# Patient Record
Sex: Female | Born: 1970 | Race: Black or African American | Hispanic: No | Marital: Single | Smoking: Never smoker
Health system: Southern US, Community
[De-identification: ages and names within clinical notes are randomized; demographics above are authoritative.]

## PROBLEM LIST (undated history)

## (undated) DIAGNOSIS — Z923 Personal history of irradiation: Secondary | ICD-10-CM

## (undated) DIAGNOSIS — M1990 Unspecified osteoarthritis, unspecified site: Secondary | ICD-10-CM

## (undated) DIAGNOSIS — D649 Anemia, unspecified: Secondary | ICD-10-CM

## (undated) DIAGNOSIS — E669 Obesity, unspecified: Secondary | ICD-10-CM

## (undated) DIAGNOSIS — Z8619 Personal history of other infectious and parasitic diseases: Secondary | ICD-10-CM

## (undated) DIAGNOSIS — I10 Essential (primary) hypertension: Secondary | ICD-10-CM

## (undated) DIAGNOSIS — C50919 Malignant neoplasm of unspecified site of unspecified female breast: Secondary | ICD-10-CM

## (undated) DIAGNOSIS — Z9289 Personal history of other medical treatment: Secondary | ICD-10-CM

## (undated) HISTORY — PX: FOOT FRACTURE SURGERY: SHX645

## (undated) HISTORY — DX: Unspecified osteoarthritis, unspecified site: M19.90

## (undated) HISTORY — DX: Personal history of other infectious and parasitic diseases: Z86.19

## (undated) HISTORY — DX: Essential (primary) hypertension: I10

## (undated) HISTORY — DX: Malignant neoplasm of unspecified site of unspecified female breast: C50.919

## (undated) HISTORY — PX: JOINT REPLACEMENT: SHX530

---

## 2006-03-19 ENCOUNTER — Ambulatory Visit: Payer: Self-pay

## 2006-03-27 ENCOUNTER — Ambulatory Visit: Payer: Self-pay

## 2006-09-28 ENCOUNTER — Ambulatory Visit: Payer: Self-pay

## 2007-06-07 ENCOUNTER — Ambulatory Visit: Payer: Self-pay

## 2010-03-08 ENCOUNTER — Observation Stay: Payer: Self-pay | Admitting: Obstetrics and Gynecology

## 2010-03-25 ENCOUNTER — Inpatient Hospital Stay: Payer: Self-pay | Admitting: Obstetrics & Gynecology

## 2010-03-30 LAB — PATHOLOGY REPORT

## 2010-07-07 HISTORY — PX: OTHER SURGICAL HISTORY: SHX169

## 2011-02-06 ENCOUNTER — Ambulatory Visit: Payer: Self-pay | Admitting: Sports Medicine

## 2011-02-28 ENCOUNTER — Ambulatory Visit: Payer: Self-pay | Admitting: Unknown Physician Specialty

## 2011-07-08 HISTORY — PX: MEDIAL PARTIAL KNEE REPLACEMENT: SHX5965

## 2011-08-21 ENCOUNTER — Ambulatory Visit: Payer: Self-pay | Admitting: Unknown Physician Specialty

## 2011-09-08 ENCOUNTER — Ambulatory Visit: Payer: Self-pay | Admitting: Orthopedic Surgery

## 2011-09-08 LAB — BASIC METABOLIC PANEL
BUN: 8 mg/dL (ref 7–18)
Calcium, Total: 9 mg/dL (ref 8.5–10.1)
Chloride: 106 mmol/L (ref 98–107)
Co2: 27 mmol/L (ref 21–32)
EGFR (Non-African Amer.): >60
Glucose: 98 mg/dL (ref 65–99)
Potassium: 3.8 mmol/L (ref 3.5–5.1)
Sodium: 141 mmol/L (ref 136–145)

## 2011-09-08 LAB — CBC
HGB: 11.8 g/dL — ABNORMAL LOW (ref 12.0–16.0)
MCH: 27.1 pg (ref 26.0–34.0)
MCV: 84 fL (ref 80–100)
Platelet: 327 10*3/uL (ref 150–440)
RBC: 4.36 10*6/uL (ref 3.80–5.20)
WBC: 11.1 10*3/uL — ABNORMAL HIGH (ref 3.6–11.0)

## 2011-09-08 LAB — APTT: Activated PTT: 27.5 secs (ref 23.6–35.9)

## 2011-09-08 LAB — SEDIMENTATION RATE: Erythrocyte Sed Rate: 21 mm/hr — ABNORMAL HIGH (ref 0–20)

## 2011-09-16 ENCOUNTER — Inpatient Hospital Stay: Payer: Self-pay | Admitting: Orthopedic Surgery

## 2011-09-16 LAB — PREGNANCY, URINE: Pregnancy Test, Urine: NEGATIVE m[IU]/mL

## 2011-09-17 LAB — BASIC METABOLIC PANEL
Anion Gap: 10 (ref 7–16)
Calcium, Total: 8.2 mg/dL — ABNORMAL LOW (ref 8.5–10.1)
Co2: 24 mmol/L (ref 21–32)
EGFR (African American): >60
EGFR (Non-African Amer.): >60
Glucose: 114 mg/dL — ABNORMAL HIGH (ref 65–99)
Osmolality: 272 (ref 275–301)

## 2011-09-17 LAB — HEMOGLOBIN: HGB: 10 g/dL — ABNORMAL LOW (ref 12.0–16.0)

## 2013-11-23 ENCOUNTER — Encounter (INDEPENDENT_AMBULATORY_CARE_PROVIDER_SITE_OTHER): Payer: Self-pay

## 2013-11-23 ENCOUNTER — Encounter: Payer: Self-pay | Admitting: Adult Health

## 2013-11-23 ENCOUNTER — Ambulatory Visit (INDEPENDENT_AMBULATORY_CARE_PROVIDER_SITE_OTHER): Payer: BC Managed Care – PPO | Admitting: Adult Health

## 2013-11-23 VITALS — BP 130/78 | HR 92 | Temp 98.3°F | Resp 14 | Ht 66.25 in | Wt 275.5 lb

## 2013-11-23 DIAGNOSIS — Z309 Encounter for contraceptive management, unspecified: Secondary | ICD-10-CM

## 2013-11-23 DIAGNOSIS — IMO0001 Reserved for inherently not codable concepts without codable children: Secondary | ICD-10-CM | POA: Insufficient documentation

## 2013-11-23 DIAGNOSIS — Z1239 Encounter for other screening for malignant neoplasm of breast: Secondary | ICD-10-CM

## 2013-11-23 MED ORDER — LEVONORGEST-ETH ESTRAD 91-DAY 0.15-0.03 &0.01 MG PO TABS: ORAL_TABLET | ORAL | Status: DC

## 2013-11-23 NOTE — Progress Notes (Signed)
Pre visit review using our clinic review tool, if applicable. No additional management support is needed unless otherwise documented below in the visit note. 

## 2013-11-23 NOTE — Patient Instructions (Signed)
   Thank you for choosing [place] [facility] for your health care needs.  We will contact you with an appointment for your mammogram and also for Gennie Alma, NP.  Please call the office if you have not heard back from [place] within 1 week.  Schedule your physical exam at your earliest convenience. You will need to be fasting for that visit.  Start Seasonique 1 tablet daily for 12 weeks. No pill on week #13 which is when you should get your period. Then restart cycle as discussed. You may experience some breakthrough bleeding/spoting during the first 2 cycles. This should subside.  Please call with any questions or concerns.

## 2013-11-23 NOTE — Progress Notes (Signed)
Patient ID: Lorraine Holmes, female   DOB: Jun 10, 1971, 43 y.o.   MRN: [id number]    Subjective:    Patient ID: Lorraine Holmes, female    DOB: 1970/11/23, 43 y.o.   MRN: [id number]  HPI Pt is a pleasant 43 y/o female who presents to establish care. Was followed by Dr. Glennon Hamilton many years ago. She has gone to the [facility] in clinic in [place]. Has not had regular PCP in many years. Last PAP was in 2011 after the birth of her daughter. Was followed previously by Gennie Alma, NP and also at [facility].  PAP 2011 - Normal Mammogram - 2010 - multiple cysts. She has hx of fine needle aspirations. No mammogram since then.   She has not been using any contraception since she had her child in 2011. She reports using condoms "sometimes". Has been with the same partner for many years. Would like to get started on [facility] and also discuss permanent options.    Past Medical History  Diagnosis Date  . Arthritis     Bilateral knee  . History of chicken pox      Past Surgical History  Procedure Laterality Date  . Medial partial knee replacement Right 2013  . Meniscus tear Right 2012  . Foot fracture surgery Left     2002     Family History  Problem Relation Age of Onset  . Hypertension Mother   . Diabetes Mother   . Hypertension Father   . Heart disease Father 35    CAD - died MI  . Diabetes Maternal Grandmother      History   Social History  . Marital Status: Single    Spouse Name: N/A    Number of Children: 2  . Years of Education: 16   Occupational History  . [employer]   Social History Main Topics  . Smoking status: Never Smoker   . Smokeless tobacco: Never Used  . Alcohol Use: 1.2 oz/week    2 Glasses of wine per week  . Drug Use: No  . Sexual Activity: Yes   Other Topics Concern  . Not on file   Social History Narrative   Taniesha grew up in [place]. She lives at home with her children and her mother. 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      Review of Systems  Constitutional: Negative.   HENT: Negative.   Eyes: Negative.   Respiratory: Negative.   Cardiovascular: Negative.   Gastrointestinal: Negative.   Endocrine: Negative.   Genitourinary: Negative.   Musculoskeletal: Negative.   Skin: Negative.   Allergic/Immunologic: Negative.   Neurological: Negative.   Hematological: Negative.   Psychiatric/Behavioral: Negative.        Objective:  BP 130/78  Pulse 92  Temp(Src) 98.3 F (36.8 C) (Oral)  Resp 14  Ht 5' 6.25" (1.683 m)  Wt 275 lb 8 oz (124.966 kg)  BMI 44.12 kg/m2  SpO2 98%  LMP 11/21/2013   Physical Exam  Constitutional: She is oriented to person, place, and time. She is cooperative. No distress.  Overweight, pleasant 43 y/o female  HENT:  Head: Normocephalic and atraumatic.  Eyes: Conjunctivae and EOM are normal.  Neck: Normal range of motion. Neck supple. No tracheal deviation present.  Cardiovascular: Normal rate, regular rhythm, normal heart sounds and intact distal pulses.  Exam reveals no gallop and no friction rub.   No murmur heard. Pulmonary/Chest:  Effort normal and breath sounds normal. No respiratory distress. She has no wheezes. She has no rales.  Musculoskeletal: Normal range of motion.  Neurological: She is alert and oriented to person, place, and time. She has normal reflexes. Coordination normal.  Skin: Skin is warm and dry.  Psychiatric: She has a normal mood and affect. Her behavior is normal. Judgment and thought content normal.       Assessment & Plan:   1. Screening for breast cancer Last Mammogram in 2010. Hx of multiple cysts. Will send for screening mammogram.  - MM DIGITAL SCREENING BILATERAL; Future  2. Birth control Start [place]. LMP 11/17/13. She will take an active pill continuously x 12 weeks and then no pills on week #13 which is when she will get her periods. May experience breakthrough bleeding first 2 cycles. I am  also referring her to Gennie Alma, NP to discuss permanent options for birth control. - Ambulatory referral to Gynecology

## 2013-12-21 ENCOUNTER — Ambulatory Visit (INDEPENDENT_AMBULATORY_CARE_PROVIDER_SITE_OTHER): Payer: BC Managed Care – PPO | Admitting: Adult Health

## 2013-12-21 ENCOUNTER — Encounter: Payer: Self-pay | Admitting: Adult Health

## 2013-12-21 VITALS — BP 124/78 | HR 79 | Temp 98.4°F | Resp 14 | Ht 66.25 in | Wt 276.0 lb

## 2013-12-21 DIAGNOSIS — Z0000 Encounter for general adult medical examination without abnormal findings: Secondary | ICD-10-CM

## 2013-12-21 LAB — COMPREHENSIVE METABOLIC PANEL
ALBUMIN: 3.7 g/dL (ref 3.5–5.2)
ALT: 12 U/L (ref 0–35)
AST: 15 U/L (ref 0–37)
Alkaline Phosphatase: 60 U/L (ref 39–117)
BUN: 7 mg/dL (ref 6–23)
CALCIUM: 9.5 mg/dL (ref 8.4–10.5)
CHLORIDE: 108 meq/L (ref 96–112)
CO2: 25 mEq/L (ref 19–32)
Creatinine, Ser: 0.8 mg/dL (ref 0.4–1.2)
GFR: 102.68 mL/min (ref >60.00)
Glucose, Bld: 94 mg/dL (ref 70–99)
POTASSIUM: 4.6 meq/L (ref 3.5–5.1)
SODIUM: 139 meq/L (ref 135–145)
TOTAL PROTEIN: 7.5 g/dL (ref 6.0–8.3)
Total Bilirubin: 0.2 mg/dL (ref 0.2–1.2)

## 2013-12-21 LAB — LIPID PANEL
CHOL/HDL RATIO: 3
Cholesterol: 133 mg/dL (ref 0–200)
HDL: 40.4 mg/dL (ref >39.00)
LDL CALC: 79 mg/dL (ref 0–99)
NONHDL: 92.6
TRIGLYCERIDES: 67 mg/dL (ref 0.0–149.0)
VLDL: 13.4 mg/dL (ref 0.0–40.0)

## 2013-12-21 LAB — CBC WITH DIFFERENTIAL/PLATELET
BASOS PCT: 0.4 % (ref 0.0–3.0)
Basophils Absolute: 0 10*3/uL (ref 0.0–0.1)
EOS ABS: 0.1 10*3/uL (ref 0.0–0.7)
Eosinophils Relative: 1.3 % (ref 0.0–5.0)
HEMATOCRIT: 34.9 % — AB (ref 36.0–46.0)
Hemoglobin: 11 g/dL — ABNORMAL LOW (ref 12.0–15.0)
LYMPHS ABS: 2.1 10*3/uL (ref 0.7–4.0)
Lymphocytes Relative: 24.8 % (ref 12.0–46.0)
MCHC: 31.5 g/dL (ref 30.0–36.0)
MCV: 80.5 fl (ref 78.0–100.0)
MONO ABS: 0.5 10*3/uL (ref 0.1–1.0)
Monocytes Relative: 5.3 % (ref 3.0–12.0)
NEUTROS ABS: 5.8 10*3/uL (ref 1.4–7.7)
NEUTROS PCT: 68.2 % (ref 43.0–77.0)
Platelets: 334 10*3/uL (ref 150.0–400.0)
RBC: 4.33 Mil/uL (ref 3.87–5.11)
RDW: 18.1 % — ABNORMAL HIGH (ref 11.5–15.5)
WBC: 8.5 10*3/uL (ref 4.0–10.5)

## 2013-12-21 LAB — TSH: TSH: 0.69 u[IU]/mL (ref 0.35–4.50)

## 2013-12-21 NOTE — Progress Notes (Signed)
Patient ID: Lorraine Holmes, female   DOB: 09/21/1970, 43 y.o.   MRN: [id number]   Subjective:    Patient ID: Lorraine Holmes, female    DOB: 23-Sep-1970, 43 y.o.   MRN: [id number]  HPI  Patient is a pleasant 43 year old female who presents to clinic for annual physical. She is up-to-date on her Pap. [facility]. Has appointment with them today for insertion of IUD. Needs appointment for mammogram. We will get labs this morning. Pt is fasting. Feeling well overall.    Past Medical History  Diagnosis Date  . Arthritis     Bilateral knee  . History of chicken pox      Past Surgical History  Procedure Laterality Date  . Medial partial knee replacement Right 2013  . Meniscus tear Right 2012  . Foot fracture surgery Left     2002     Family History  Problem Relation Age of Onset  . Hypertension Mother   . Diabetes Mother   . Hypertension Father   . Heart disease Father 36    CAD - died MI  . Diabetes Maternal Grandmother      History   Social History  . Marital Status: Single    Spouse Name: N/A    Number of Children: 2  . Years of Education: 16   Occupational History  . [employer]   Social History Main Topics  . Smoking status: Never Smoker   . Smokeless tobacco: Never Used  . Alcohol Use: 1.2 oz/week    2 Glasses of wine per week  . Drug Use: No  . Sexual Activity: Yes   Other Topics Concern  . Not on file   Social History Narrative   Tianni grew up in [place]. She lives at home with her children and her mother. 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      Current Outpatient Prescriptions on File Prior to Visit  Medication Sig Dispense Refill  . cetirizine (ZYRTEC) 10 MG tablet Take 10 mg by mouth daily.      . Levonorgestrel-Ethinyl Estradiol (AMETHIA,CAMRESE) 0.15-0.03 &0.01 MG tablet Take 1 active pill daily for 12 weeks.  1 Package  4  . naproxen sodium (ANAPROX) 220 MG  tablet Take 220 mg by mouth 2 (two) times daily with a meal.       No current facility-administered medications on file prior to visit.     Review of Systems  Constitutional: Negative.   HENT: Negative.   Eyes: Negative.   Respiratory: Negative.   Cardiovascular: Negative.   Gastrointestinal: Negative.   Endocrine: Negative.   Genitourinary: Negative.   Musculoskeletal: Negative.   Skin: Negative.   Allergic/Immunologic: Negative.   Neurological: Negative.   Hematological: Negative.   Psychiatric/Behavioral: Negative.        Objective:  BP 124/78  Pulse 79  Temp(Src) 98.4 F (36.9 C) (Oral)  Resp 14  Ht 5' 6.25" (1.683 m)  Wt 276 lb (125.193 kg)  BMI 44.20 kg/m2  SpO2 100%  LMP 11/21/2013   Physical Exam  Constitutional: She is oriented to person, place, and time. No distress.  Overweight pleasant 43 y/o female.  HENT:  Head: Normocephalic and atraumatic.  Right Ear: External ear normal.  Left Ear: External ear normal.  Nose: Nose normal.  Mouth/Throat: Oropharynx is clear and moist.  Eyes: Conjunctivae and EOM are normal. Pupils are equal, round, and reactive to  light.  Neck: Normal range of motion. Neck supple. No tracheal deviation present. No thyromegaly present.  Cardiovascular: Normal rate, regular rhythm, normal heart sounds and intact distal pulses.  Exam reveals no gallop and no friction rub.   No murmur heard. Pulmonary/Chest: Effort normal and breath sounds normal. No respiratory distress. She has no wheezes. She has no rales.  Abdominal: Soft. Bowel sounds are normal. She exhibits no distension and no mass. There is no tenderness. There is no rebound and no guarding.  Musculoskeletal: Normal range of motion. She exhibits no edema and no tenderness.  Lymphadenopathy:    She has no cervical adenopathy.  Neurological: She is alert and oriented to person, place, and time. She has normal reflexes. No cranial nerve deficit. Coordination normal.  Skin: Skin  is warm and dry.  Psychiatric: She has a normal mood and affect. Her behavior is normal. Judgment and thought content normal.      Assessment & Plan:   1. Routine general medical examination at a health care facility Normal physical exam. Screenings addressed with pt. We will schedule her mammogram today. Labs ordered.  - TSH - CBC with Differential - Lipid panel - Comprehensive metabolic panel

## 2013-12-21 NOTE — Progress Notes (Signed)
Pre visit review using our clinic review tool, if applicable. No additional management support is needed unless otherwise documented below in the visit note. 

## 2013-12-21 NOTE — Patient Instructions (Signed)
  You had your annual physical today.  Please have labs drawn prior to leaving the office and schedule your mammogram.  We will contact you with the results of the labs once they are available

## 2013-12-22 ENCOUNTER — Other Ambulatory Visit (INDEPENDENT_AMBULATORY_CARE_PROVIDER_SITE_OTHER): Payer: BC Managed Care – PPO

## 2013-12-22 ENCOUNTER — Other Ambulatory Visit: Payer: Self-pay | Admitting: Adult Health

## 2013-12-22 DIAGNOSIS — D649 Anemia, unspecified: Secondary | ICD-10-CM

## 2013-12-23 ENCOUNTER — Other Ambulatory Visit: Payer: Self-pay | Admitting: Adult Health

## 2013-12-23 LAB — VITAMIN B12: Vitamin B-12: 320 pg/mL (ref 211–911)

## 2013-12-23 LAB — FOLATE: Folate: 22 ng/mL (ref >5.9)

## 2013-12-23 LAB — IRON: Iron: 35 ug/dL — ABNORMAL LOW (ref 42–145)

## 2013-12-23 LAB — FERRITIN: Ferritin: 7.1 ng/mL — ABNORMAL LOW (ref 10.0–291.0)

## 2013-12-23 MED ORDER — FERROUS SULFATE 325 (65 FE) MG PO TABS: 325.0000 mg | ORAL_TABLET | Freq: Two times a day (BID) | ORAL | Status: DC

## 2014-01-06 ENCOUNTER — Ambulatory Visit: Payer: Self-pay | Admitting: Internal Medicine

## 2014-01-17 ENCOUNTER — Encounter: Payer: Self-pay | Admitting: *Deleted

## 2014-01-17 ENCOUNTER — Ambulatory Visit: Payer: Self-pay | Admitting: Adult Health

## 2014-01-17 LAB — HM MAMMOGRAPHY: HM MAMMO: NEGATIVE

## 2014-06-16 ENCOUNTER — Other Ambulatory Visit: Payer: Self-pay | Admitting: *Deleted

## 2014-06-16 MED ORDER — FERROUS SULFATE 325 (65 FE) MG PO TABS: 325.0000 mg | ORAL_TABLET | Freq: Two times a day (BID) | ORAL | Status: DC

## 2014-09-26 ENCOUNTER — Ambulatory Visit: Payer: Self-pay | Admitting: Unknown Physician Specialty

## 2014-10-29 NOTE — Discharge Summary (Signed)
PATIENT NAME:  Lorraine Holmes, Lorraine Holmes MR#:  [id number] DATE OF BIRTH:  [birth year]  DATE OF ADMISSION:  09/16/2011 DATE OF DISCHARGE:  09/18/2011  ADMITTING DIAGNOSES:  1. Right knee pain/arthritis.  2. Status post right knee unicondylar arthroplasty.   DISCHARGE DIAGNOSES:  1. Right knee pain/arthritis.  2. Status post right knee unicondylar arthroplasty.  3. Nausea and vomiting, improved.  ATTENDING: Dr. Hessie Knows with [facility] orthopedics.   PROCEDURE: On 03/12 the patient underwent right knee unicondylar arthroplasty by Dr. Rudene Christians. This is of the medial compartment.   ASSISTANT: Dorthula Matas, PA-C.   ANESTHESIA: Spinal.  ESTIMATED BLOOD LOSS: Minimal.   TOURNIQUET TIME: 79 minutes.   OPERATIVE FINDINGS: Severe medial compartment degenerative joint disease.   IMPLANTS: Biomet, Oxford.   COMPLICATIONS: No complications occurred.   PATIENT HISTORY: Patient is a 44 year old Dr. Jefm Bryant had taken care of. She had had arthroscopy with partial medial and lateral meniscectomy. The lateral compartment was essentially normal. 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 He had talked to her about an Sallyanne Havers because she is unable to go to [facility] for a MAKOplasty. Patient was referred to Dr. Rudene Christians for an [place]. Oxford signature MRI has already been done and the patient reports constant pain with activity and pain at rest. She has difficulty with ambulation. The knee will wake her at night.   ALLERGIES AND ADVERSE REACTIONS: None.   PAST MEDICAL HISTORY: Negative.   PHYSICAL EXAMINATION: HEART: Regular rate and rhythm. LUNGS: Clear to auscultation. RIGHT KNEE: She has full extension and flexion to 115 degrees. There is marked medial compartment crepitation. She has varus deformity that is passively correctable. There is a large effusion present including a large Baker's cyst.   LABORATORY, DIAGNOSTIC AND  RADIOLOGICAL DATA; Prior x-rays and MRI of her knee were reviewed including stress views.   HOSPITAL COURSE: Patient underwent aforementioned procedure by Dr. Rudene Christians on 76/73 without complication and was transferred to the postanesthesia care unit and then the orthopedic floor in stable condition. She initially had a good deal of nausea and vomiting which did improve. This may have been due to anesthesia. Patient's Foley catheter had been draining urine but had been removed promptly. Patient would end up tolerating her diet well. She would have TED hose and Xarelto for deep vein thrombosis prophylaxis. Her hemoglobin was approximately 10 and her platelets were within normal limits on first day postop. Second day postop her right knee bandage was changed and she was found to have staples intact and no sign of infection. Covaderm was applied in sterile fashion. Patient did go to using a bedside commode. She would work with physical therapy on several occasions while here and was able to work on stairs. She had ambulated 100 feet with rolling walker and supervision. With some help she bent her knee to 82 degrees.   CONDITION AT DISCHARGE: Stable.   DISPOSITION: Home with home physical therapy.   DISCHARGE MEDICATIONS:  1. Xarelto 10 mg one a day until finished.  2. Celebrex 200 mg 1 to 2 a day as needed for pain and swelling.  3. Oxycodone 5 to 10 mg every four hours as needed for pain.  4. Front wheeled rolling walker was also prescribed.   DISCHARGE INSTRUCTIONS AND FOLLOW UP:  1. She is weight-bearing as tolerated on the surgical leg. She should try to elevated it and also wear TED hose during the day.  2. Regular diet.  3.  Take a multivitamin a day.  4. She will use the Polar Care unit but keep her bandage on and keep it clean and dry.  5. She will call our office for any disturbing symptoms. She will call our office to confirm a follow-up appointment. She already has one on 10/01/2011 at 9:00 a.m.  with Dr. Rudene Christians.   ____________________________ Jerrel Ivory. Saina Waage, [place] jrp:cms D: 09/18/2011 10:43:36 ET T: 09/19/2011 10:18:02 ET JOB#: [id number]  cc: Jerrel Ivory. Charlett Nose, [place], <Dictator> [place] PA ELECTRONICALLY SIGNED 09/23/2011 10:22

## 2014-10-29 NOTE — Op Note (Signed)
PATIENT NAME:  Lorraine Holmes, Lorraine Holmes MR#:  [id number] DATE OF BIRTH:  1970/10/07  DATE OF PROCEDURE:  09/16/2011  PREOPERATIVE DIAGNOSIS: Right knee medial compartment osteoarthritis.   POSTOPERATIVE DIAGNOSIS: Right knee medial compartment osteoarthritis.  PROCEDURE: Right knee medial compartment arthroplasty with Oxford unicompartmental arthroplasty components.   SURGEON: Laurene Footman, MD   ASSISTANT: Dorthula Matas, PA-C   ANESTHESIA: Spinal.   DESCRIPTION OF PROCEDURE: The patient was brought to the operating room and after adequate spinal anesthesia was obtained, the right leg was placed in the legholder with a tourniquet applied and when adequate positioning was obtained, the leg was prepped and draped in the usual sterile fashion. After patient identification and time-out procedures were completed, the leg was exsanguinated with an Esmarch and tourniquet raised to 350 mmHg. Medial skin incision was made anteromedially and the medial capsulotomy performed. Anterior horn of the meniscus was excised and the femoral drill guide was placed over the medial femur and locked into position. The twin pegs were drilled at this point for the small femoral component. A 0 milling was performed on the femur and the posterior cut was also carried out at this time. The tibial guide was inserted and using the signature system sagittal cut followed by reciprocating cut were carried out with removal of appropriate portion of the proximal tibia. Next, the tibia cut was carried out with the extramedullary alignment guide placed as well with a 0 shim. With trial components placed, a 3 milling was required. After the 3 milling, 3 feeler gauge fit well in flexion and extension. Anterior milling was performed at this time and there were no posterior osteophytes. The tibial template was placed at the appropriate level and pin holding it in position, toothbrush saw used to cut the groove in the slot for the cement fixation of  the keel of the tibial component. At this point, the residual posterior horn was excised, trial components placed, and the knee felt stable with a 3 feeler gauge in place. At this point, the bony surfaces were thoroughly irrigated and dried with drill holes made in the distal femur for some additional cement fixation. The tibial component was cemented into place first followed by the femoral component. Three feeler gauge was held in position with the knee in slight flexion and pressure on the components. After the cement had set, the 3 trial was snug and the final component was inserted after thoroughly irrigating out the knee and injecting posteriorly with Toradol, morphine, and Sensorcaine with epinephrine. This was injected around the arthrotomy as well. The 3 mm polyethylene insert popped into place and went through a range of motion. There was no impingement. It was quite stable. There was still some residual varus deformity to the knee as per preop plan. After thoroughly irrigating the knee, the wound was closed using a running heavy quill suture, 2-0 quill subcutaneously, and skin staples. Xeroform, 4 x 4's, Webril, Ace wrap, and Polar Care were applied. The patient was sent to the recovery room in stable condition.   ESTIMATED BLOOD LOSS: 50 mL.  TOURNIQUET TIME: 79 minutes at 350 mmHg.  IMPLANTS: Biomet Oxford system with a small femoral component, size A right medial tibial tray and a 3 mm meniscal bearing insert.   SPECIMEN: None.   COMPLICATIONS: None.   ____________________________ Laurene Footman, MD mjm:drc D: 09/16/2011 12:16:15 ET T: 09/16/2011 14:26:09 ET JOB#: [id number]  cc: Laurene Footman, MD, <Dictator> Laurene Footman MD ELECTRONICALLY SIGNED 09/16/2011 16:12

## 2014-11-20 ENCOUNTER — Encounter: Payer: Self-pay | Admitting: *Deleted

## 2014-11-20 ENCOUNTER — Inpatient Hospital Stay: Admission: RE | Admit: 2014-11-20 | Payer: Self-pay | Source: Ambulatory Visit

## 2014-11-20 DIAGNOSIS — M23304 Other meniscus derangements, unspecified medial meniscus, left knee: Secondary | ICD-10-CM | POA: Diagnosis not present

## 2014-11-20 DIAGNOSIS — S83242A Other tear of medial meniscus, current injury, left knee, initial encounter: Secondary | ICD-10-CM | POA: Diagnosis present

## 2014-11-20 DIAGNOSIS — Z9102 Food additives allergy status: Secondary | ICD-10-CM | POA: Diagnosis not present

## 2014-11-20 NOTE — Patient Instructions (Addendum)
  Your procedure is scheduled on: 11-28-14 Report to [facility] Same day [facility] Desk 2nd floor To find out your arrival time please call [phone] between 1PM - 3PM on 11-27-14 (Monday)  Remember: Instructions that are not followed completely may result in serious medical risk, up to and including death, or upon the discretion of your surgeon and anesthesiologist your surgery may need to be rescheduled.    __x__ 1. Do not eat food or drink liquids after midnight. No gum chewing or hard candies.     __x__ 2. No Alcohol for 24 hours before or after surgery.   ____ 3. Bring all medications with you on the day of surgery if instructed.    __x__ 4. Notify your doctor if there is any change in your medical condition     (cold, fever, infections).     Do not wear jewelry, make-up, hairpins, clips or nail polish.  Do not wear lotions, powders, or perfumes. You may wear deodorant.  Do not shave 48 hours prior to surgery. Men may shave face and neck.  Do not bring valuables to the hospital.    [facility] is not responsible for any belongings or valuables.               Contacts, dentures or bridgework may not be worn into surgery.  Leave your suitcase in the car. After surgery it may be brought to your room.  For patients admitted to the hospital, discharge time is determined by your  treatment team.   Patients discharged the day of surgery will not be allowed to drive home.   Please read over the following fact sheets that you were given:   CHG Instructions  ____ Take these medicines the morning of surgery with A SIP OF WATER:    1. NONE  2.   3.   4.  5.  6.  ____ Fleet Enema (as directed)   _x___ Use CHG Soap as directed  ____ Use inhalers on the day of surgery  ____ Stop metformin 2 days prior to surgery    ____ Take 1/2 of usual insulin dose the night before surgery and none on the morning of surgery.   ____ Stop Coumadin/Plavix/aspirin N/A  ____ Stop  Anti-inflammatories on NOW (MELOXICAM)   ____ Stop supplements until after surgery.    ____ Bring C-Pap to the hospital.

## 2014-11-23 ENCOUNTER — Encounter: Admission: RE | Admit: 2014-11-23 | Payer: BLUE CROSS/BLUE SHIELD | Source: Ambulatory Visit

## 2014-11-23 ENCOUNTER — Other Ambulatory Visit: Payer: Self-pay | Admitting: Nurse Practitioner

## 2014-11-23 DIAGNOSIS — Z760 Encounter for issue of repeat prescription: Secondary | ICD-10-CM

## 2014-11-28 ENCOUNTER — Ambulatory Visit: Payer: BLUE CROSS/BLUE SHIELD | Admitting: Anesthesiology

## 2014-11-28 ENCOUNTER — Ambulatory Visit
Admission: RE | Admit: 2014-11-28 | Discharge: 2014-11-28 | Disposition: A | Discharge status: Home / Self Care | Payer: BLUE CROSS/BLUE SHIELD | Source: Ambulatory Visit | Attending: Orthopedic Surgery | Admitting: Orthopedic Surgery

## 2014-11-28 ENCOUNTER — Encounter
Admission: RE | Disposition: A | Discharge status: Home / Self Care | Payer: Self-pay | Source: Ambulatory Visit | Attending: Orthopedic Surgery

## 2014-11-28 ENCOUNTER — Encounter: Payer: Self-pay | Admitting: *Deleted

## 2014-11-28 DIAGNOSIS — Z6841 Body Mass Index (BMI) 40.0 and over, adult: Secondary | ICD-10-CM | POA: Insufficient documentation

## 2014-11-28 DIAGNOSIS — Z9102 Food additives allergy status: Secondary | ICD-10-CM | POA: Diagnosis not present

## 2014-11-28 DIAGNOSIS — D649 Anemia, unspecified: Secondary | ICD-10-CM | POA: Insufficient documentation

## 2014-11-28 DIAGNOSIS — Z96651 Presence of right artificial knee joint: Secondary | ICD-10-CM | POA: Insufficient documentation

## 2014-11-28 DIAGNOSIS — M1712 Unilateral primary osteoarthritis, left knee: Secondary | ICD-10-CM | POA: Insufficient documentation

## 2014-11-28 DIAGNOSIS — M23304 Other meniscus derangements, unspecified medial meniscus, left knee: Secondary | ICD-10-CM | POA: Insufficient documentation

## 2014-11-28 HISTORY — DX: Anemia, unspecified: D64.9

## 2014-11-28 HISTORY — PX: KNEE ARTHROSCOPY WITH MEDIAL MENISECTOMY: SHX5651

## 2014-11-28 LAB — POCT PREGNANCY, URINE: PREG TEST UR: NEGATIVE

## 2014-11-28 SURGERY — ARTHROSCOPY, KNEE, WITH MEDIAL MENISCECTOMY
Anesthesia: General | Laterality: Left | Wound class: Clean

## 2014-11-28 MED ORDER — HYDROMORPHONE HCL 1 MG/ML IJ SOLN
0.2500 mg | INTRAMUSCULAR | Status: DC | PRN
  Administered 2014-11-28 (×2): 0.5 mg via INTRAVENOUS

## 2014-11-28 MED ORDER — LACTATED RINGERS IV SOLN
INTRAVENOUS | Status: DC
  Administered 2014-11-28 (×3): via INTRAVENOUS

## 2014-11-28 MED ORDER — BUPIVACAINE-EPINEPHRINE (PF) 0.5% -1:200000 IJ SOLN
INTRAMUSCULAR | Status: AC
  Filled 2014-11-28: qty 30

## 2014-11-28 MED ORDER — FAMOTIDINE 20 MG PO TABS
20.0000 mg | ORAL_TABLET | Freq: Once | ORAL | Status: AC
  Administered 2014-11-28: 20 mg via ORAL

## 2014-11-28 MED ORDER — FENTANYL CITRATE (PF) 100 MCG/2ML IJ SOLN
INTRAMUSCULAR | Status: DC | PRN
  Administered 2014-11-28: 100 ug via INTRAVENOUS

## 2014-11-28 MED ORDER — ONDANSETRON HCL 4 MG PO TABS: 4.0000 mg | ORAL_TABLET | Freq: Four times a day (QID) | ORAL | Status: DC | PRN

## 2014-11-28 MED ORDER — HYDROCODONE-ACETAMINOPHEN 5-325 MG PO TABS: 1.0000 | ORAL_TABLET | ORAL | Status: DC | PRN

## 2014-11-28 MED ORDER — FENTANYL CITRATE (PF) 100 MCG/2ML IJ SOLN
INTRAMUSCULAR | Status: AC
  Filled 2014-11-28: qty 2

## 2014-11-28 MED ORDER — HYDROMORPHONE HCL 1 MG/ML IJ SOLN
INTRAMUSCULAR | Status: AC
  Filled 2014-11-28: qty 1

## 2014-11-28 MED ORDER — BUPIVACAINE-EPINEPHRINE (PF) 0.5% -1:200000 IJ SOLN
INTRAMUSCULAR | Status: DC | PRN
  Administered 2014-11-28: 20 mL via PERINEURAL

## 2014-11-28 MED ORDER — SODIUM CHLORIDE 0.9 % IV SOLN: INTRAVENOUS | Status: DC

## 2014-11-28 MED ORDER — HYDROCODONE-ACETAMINOPHEN 5-325 MG PO TABS
1.0000 | ORAL_TABLET | Freq: Four times a day (QID) | ORAL | Status: DC | PRN
Start: 2014-11-28 — End: 2017-02-25

## 2014-11-28 MED ORDER — HYDROCODONE-ACETAMINOPHEN 5-325 MG PO TABS
ORAL_TABLET | ORAL | Status: AC
  Filled 2014-11-28: qty 2

## 2014-11-28 MED ORDER — MIDAZOLAM HCL 2 MG/2ML IJ SOLN
INTRAMUSCULAR | Status: DC | PRN
  Administered 2014-11-28: 2 mg via INTRAVENOUS

## 2014-11-28 MED ORDER — LACTATED RINGERS IR SOLN
Status: DC | PRN
  Administered 2014-11-28: 9000 mL

## 2014-11-28 MED ORDER — FAMOTIDINE 20 MG PO TABS
ORAL_TABLET | ORAL | Status: AC
  Administered 2014-11-28: 20 mg via ORAL
  Filled 2014-11-28: qty 1

## 2014-11-28 MED ORDER — ONDANSETRON HCL 4 MG/2ML IJ SOLN
INTRAMUSCULAR | Status: DC | PRN
  Administered 2014-11-28: 4 mg via INTRAVENOUS

## 2014-11-28 MED ORDER — ONDANSETRON HCL 4 MG/2ML IJ SOLN: 4.0000 mg | Freq: Once | INTRAMUSCULAR | Status: DC | PRN

## 2014-11-28 MED ORDER — FENTANYL CITRATE (PF) 100 MCG/2ML IJ SOLN
25.0000 ug | INTRAMUSCULAR | Status: DC | PRN
  Administered 2014-11-28 (×4): 25 ug via INTRAVENOUS

## 2014-11-28 MED ORDER — PROPOFOL 10 MG/ML IV BOLUS
INTRAVENOUS | Status: DC | PRN
  Administered 2014-11-28: 180 mg via INTRAVENOUS

## 2014-11-28 MED ORDER — METOCLOPRAMIDE HCL 10 MG PO TABS: 5.0000 mg | ORAL_TABLET | Freq: Three times a day (TID) | ORAL | Status: DC | PRN

## 2014-11-28 MED ORDER — HYDROCODONE-ACETAMINOPHEN 5-325 MG PO TABS
1.0000 | ORAL_TABLET | Freq: Four times a day (QID) | ORAL | Status: DC | PRN
  Administered 2014-11-28: 2 via ORAL

## 2014-11-28 MED ORDER — LIDOCAINE HCL (CARDIAC) 20 MG/ML IV SOLN
INTRAVENOUS | Status: DC | PRN
  Administered 2014-11-28: 100 mg via INTRAVENOUS

## 2014-11-28 MED ORDER — METOCLOPRAMIDE HCL 5 MG/ML IJ SOLN: 5.0000 mg | Freq: Three times a day (TID) | INTRAMUSCULAR | Status: DC | PRN

## 2014-11-28 MED ORDER — ONDANSETRON HCL 4 MG/2ML IJ SOLN: 4.0000 mg | Freq: Four times a day (QID) | INTRAMUSCULAR | Status: DC | PRN

## 2014-11-28 SURGICAL SUPPLY — 27 items
BANDAGE ELASTIC 4 CLIP NS LF (GAUZE/BANDAGES/DRESSINGS) ×2 IMPLANT
BANDAGE ELASTIC 4 CLIP ST LF (GAUZE/BANDAGES/DRESSINGS) ×2 IMPLANT
BLADE FULL RADIUS 3.5 (BLADE) ×2 IMPLANT
BLADE INCISOR PLUS 4.5 (BLADE) ×4 IMPLANT
BLADE SHAVER 4.5 DBL SERAT CV (CUTTER) ×2 IMPLANT
BLADE SHAVER 4.5X7 STR FR (MISCELLANEOUS) ×2 IMPLANT
CHLORAPREP W/TINT 26ML (MISCELLANEOUS) ×2 IMPLANT
GAUZE PETRO XEROFOAM 1X8 (MISCELLANEOUS) ×2 IMPLANT
GAUZE SPONGE 4X4 12PLY STRL (GAUZE/BANDAGES/DRESSINGS) ×2 IMPLANT
GLOVE BIOGEL PI IND STRL 9 (GLOVE) ×2 IMPLANT
GLOVE BIOGEL PI INDICATOR 9 (GLOVE) ×2
GLOVE SURG ORTHO 9.0 STRL STRW (GLOVE) ×4 IMPLANT
GOWN SPECIALTY ULTRA XL (MISCELLANEOUS) ×2 IMPLANT
GOWN STRL REUS W/ TWL LRG LVL3 (GOWN DISPOSABLE) ×1 IMPLANT
GOWN STRL REUS W/TWL LRG LVL3 (GOWN DISPOSABLE) ×1
IV LACTATED RINGER IRRG 3000ML (IV SOLUTION) ×4
IV LR IRRIG 3000ML ARTHROMATIC (IV SOLUTION) ×4 IMPLANT
KIT RM TURNOVER STRD PROC AR (KITS) ×2 IMPLANT
MANIFOLD NEPTUNE II (INSTRUMENTS) ×2 IMPLANT
PACK ARTHROSCOPY KNEE (MISCELLANEOUS) ×2 IMPLANT
SET TUBE SUCT SHAVER OUTFL 24K (TUBING) ×2 IMPLANT
SET TUBE TIP INTRA-ARTICULAR (MISCELLANEOUS) ×2 IMPLANT
SUT ETHILON 4-0 (SUTURE) ×1
SUT ETHILON 4-0 FS2 18XMFL BLK (SUTURE) ×1
SUTURE ETHLN 4-0 FS2 18XMF BLK (SUTURE) ×1 IMPLANT
TUBING ARTHRO INFLOW-ONLY STRL (TUBING) ×2 IMPLANT
WAND HAND CNTRL MULTIVAC 50 (MISCELLANEOUS) ×2 IMPLANT

## 2014-11-28 NOTE — Anesthesia Procedure Notes (Signed)
Procedure Name: LMA Insertion Date/Time: 11/28/2014 10:35 AM Performed by: Nelda Marseille Pre-anesthesia Checklist: Patient identified, Emergency Drugs available, Suction available, Patient being monitored and Timeout performed Patient Re-evaluated:Patient Re-evaluated prior to inductionPreoxygenation: Pre-oxygenation with 100% oxygen Intubation Type: IV induction Ventilation: Mask ventilation without difficulty LMA Size: 3.5 Number of attempts: 1 Placement Confirmation: positive ETCO2 and breath sounds checked- equal and bilateral Tube secured with: Tape Dental Injury: Teeth and Oropharynx as per pre-operative assessment  Difficulty Due To: Difficulty was unanticipated

## 2014-11-28 NOTE — Anesthesia Preprocedure Evaluation (Signed)
Anesthesia Evaluation  Patient identified by MRN, date of birth, ID band Patient awake    Reviewed: Allergy & Precautions, NPO status , Patient's Chart, lab work & pertinent test results  History of Anesthesia Complications (+) PONV  Airway Mallampati: II  TM Distance: >3 FB Neck ROM: Full    Dental   Pulmonary          Cardiovascular     Neuro/Psych    GI/Hepatic GERD- (last symptoms 4-5 months ago)  ,  Endo/Other  Morbid obesity  Renal/GU      Musculoskeletal  (+) Arthritis -, Osteoarthritis,    Abdominal   Peds  Hematology  (+) anemia ,   Anesthesia Other Findings   Reproductive/Obstetrics                             Anesthesia Physical Anesthesia Plan  ASA: II  Anesthesia Plan: General   Post-op Pain Management:    Induction: Intravenous  Airway Management Planned: LMA  Additional Equipment:   Intra-op Plan:   Post-operative Plan:   Informed Consent: I have reviewed the patients History and Physical, chart, labs and discussed the procedure including the risks, benefits and alternatives for the proposed anesthesia with the patient or authorized representative who has indicated his/her understanding and acceptance.     Plan Discussed with:   Anesthesia Plan Comments:         Anesthesia Quick Evaluation

## 2014-11-28 NOTE — Discharge Instructions (Signed)
AMBULATORY SURGERY  DISCHARGE INSTRUCTIONS   1) The drugs that you were given will stay in your system until tomorrow so for the next 24 hours you should not:  A) Drive an automobile B) Make any legal decisions C) Drink any alcoholic beverage   2) You may resume regular meals tomorrow.  Today it is better to start with liquids and gradually work up to solid foods.  You may eat anything you prefer, but it is better to start with liquids, then soup and crackers, and gradually work up to solid foods.   3) Please notify your doctor immediately if you have any unusual bleeding, trouble breathing, redness and pain at the surgery site, drainage, fever, or pain not relieved by medication. 4)   5) Your post-operative visit with Dr.    George Ina                                 is: Date:                        Time:    Please call to schedule your post-operative visit.  6) Additional Instructions: 7)

## 2014-11-28 NOTE — Op Note (Signed)
11/28/2014  11:32 AM  PATIENT:  Lorraine Holmes  44 y.o. female  PRE-OPERATIVE DIAGNOSIS:  Internal derangement knee  POST-OPERATIVE DIAGNOSIS:  Internal derangement knee  PROCEDURE:  Procedure(s): KNEE ARTHROSCOPY WITH MEDIAL MENISECTOMY (Left)  SURGEON: Laurene Footman, MD  ASSISTANTS: None  ANESTHESIA:   general  EBL:  Total I/O In: 500 [I.V.:500] Out: 10 [Blood:10]  BLOOD ADMINISTERED:none  DRAINS: none   LOCAL MEDICATIONS USED:  MARCAINE     SPECIMEN:  No Specimen  DISPOSITION OF SPECIMEN:  N/A  COUNTS:  YES  TOURNIQUET:   none  IMPLANTS: None  DICTATION: .Dragon Dictation patient was brought to the operating room and after adequate general anesthesia was obtained the left leg was placed in arthroscopic leg holder with a tourniquet applied but not required. After prepping and draping in sterile fashion appropriate patient identification and timeout procedure completed. Inferolateral portal was made and the scope was introduced. Initial evaluation showed significant tricompartment osteoarthritis with significant fissuring and loss of most of the superficial layer articular cartilage throughout the knee. His bodies but there is a great deal of cartilage debris within the joint. Corral medial compartment inferior medial portal was made and a posterior horn tear consistent with the MRI was identified extending through most the posterior horn meniscal punch and shaver and ArthriCare wand were used to debride this tear. The anterior cruciate ligament was intact lateral compartment had less wear but still significant fissuring and debris loss of the superficial layer of articular cartilage. No there is no exposed bone but again significant tricompartmental degenerative changes present. Patellofemoral tracking was normal. After debriding the medial meniscus tear adequately the knee was irrigated until clear argentation withdrawn. The knee incisions were closed with simple interrupted  4-0 nylon and 20 cc half percent Sensorcaine with epinephrine used for postop analgesia. Wounds dressed with Xeroform 4 x 4's web roll and Ace wrap  PLAN OF CARE: Discharge to home after PACU  PATIENT DISPOSITION:  PACU - hemodynamically stable.

## 2014-11-28 NOTE — H&P (Signed)
Reviewed paper H+P, will be scanned into chart. No changes noted.  

## 2014-11-28 NOTE — Transfer of Care (Signed)
Immediate Anesthesia Transfer of Care Note  Patient: Lorraine Holmes  Procedure(s) Performed: Procedure(s): KNEE ARTHROSCOPY WITH MEDIAL MENISECTOMY (Left)  Patient Location: PACU  Anesthesia Type:General  Level of Consciousness: awake  Airway & Oxygen Therapy: Patient Spontanous Breathing  Post-op Assessment: Report given to RN and Post -op Vital signs reviewed and stable  Post vital signs: Reviewed and stable  Last Vitals:  Filed Vitals:   11/28/14 1133  BP: 153/81  Pulse: 98  Temp: 36.6 C  Resp: 16    Complications: No apparent anesthesia complications

## 2014-11-28 NOTE — Anesthesia Postprocedure Evaluation (Signed)
  Anesthesia Post-op Note  Patient: Lorraine Holmes  Procedure(s) Performed: Procedure(s): KNEE ARTHROSCOPY WITH MEDIAL MENISECTOMY (Left)  Anesthesia type:General  Patient location: PACU  Post pain: Pain level controlled  Post assessment: Post-op Vital signs reviewed, Patient's Cardiovascular Status Stable, Respiratory Function Stable, Patent Airway and No signs of Nausea or vomiting  Post vital signs: Reviewed and stable  Last Vitals:  Filed Vitals:   11/28/14 1247  BP: 146/76  Pulse: 84  Temp: 36.3 C  Resp: 16    Level of consciousness: awake, alert  and patient cooperative  Complications: No apparent anesthesia complications

## 2014-11-29 ENCOUNTER — Encounter: Payer: Self-pay | Admitting: Orthopedic Surgery

## 2014-12-07 ENCOUNTER — Encounter: Payer: Self-pay | Admitting: Obstetrics and Gynecology

## 2014-12-25 ENCOUNTER — Encounter: Payer: BLUE CROSS/BLUE SHIELD | Admitting: Adult Health

## 2015-06-19 IMAGING — MG MM DIGITAL SCREENING BILAT W/ CAD
1 series · 5 of 5 positions shown · non-contrast
Comparison: Previous exam(s).

CLINICAL DATA: Screening.

EXAM:
DIGITAL SCREENING BILATERAL MAMMOGRAM WITH CAD

[R CC · right · 5 of 5 slices shown]
[im 1/5]
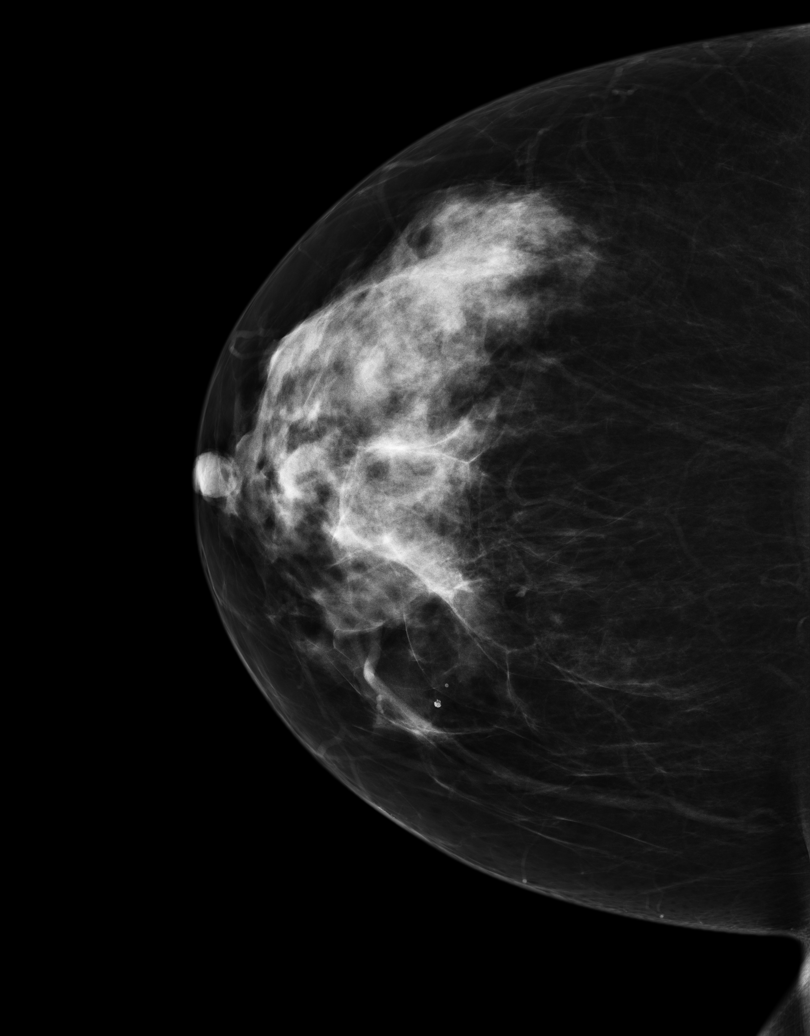
[im 2/5]
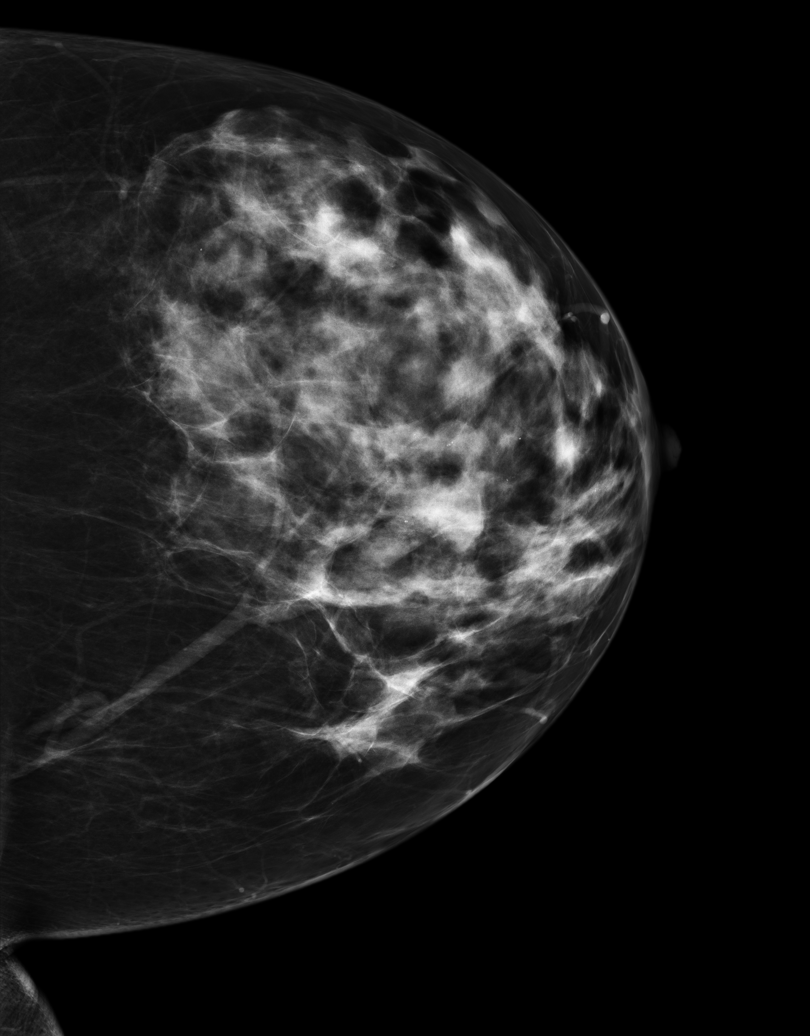
[im 3/5]
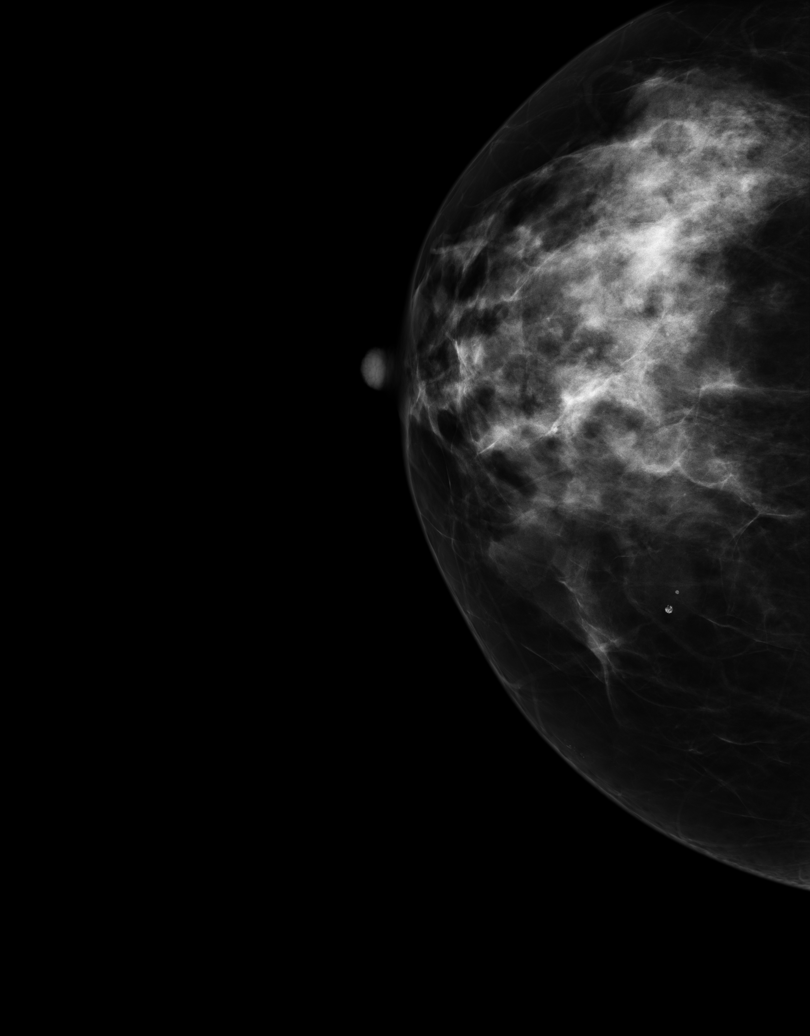
[im 4/5]
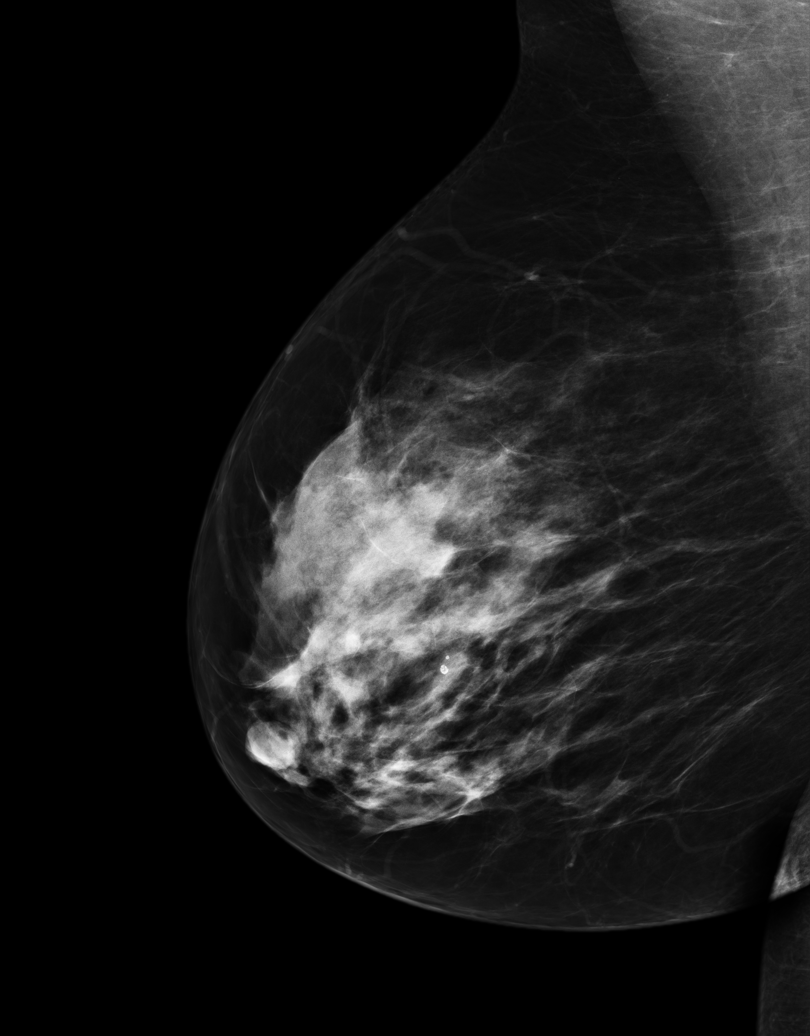
[im 5/5]
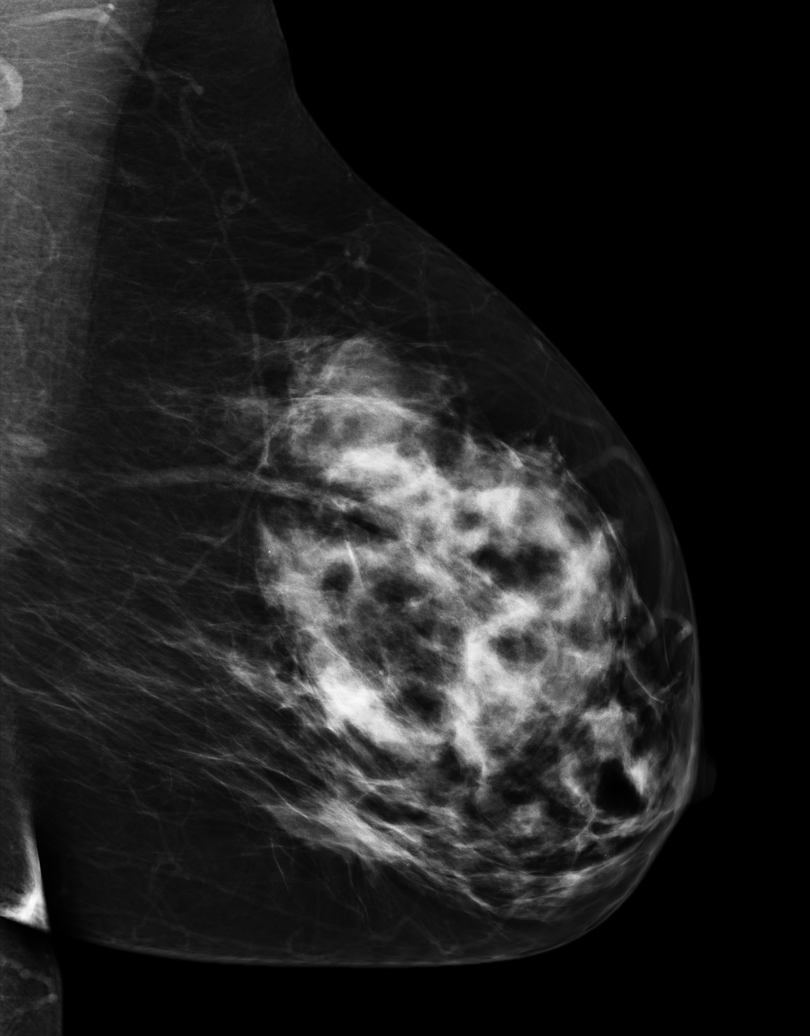

[5 of 5 positions shown; findings below may reference images not displayed]

ACR Breast Density Category c: The breast tissue is heterogeneously
dense, which may obscure small masses.
FINDINGS: There are no findings suspicious for malignancy. Images were
processed with CAD.
IMPRESSION: No mammographic evidence of malignancy. A result letter of this
screening mammogram will be mailed directly to the patient.

RECOMMENDATION:
Screening mammogram in one year. (Code:[id number])

BI-RADS CATEGORY  1: Negative.

## 2016-05-22 ENCOUNTER — Ambulatory Visit
Admission: EM | Admit: 2016-05-22 | Discharge: 2016-05-22 | Disposition: A | Discharge status: Home / Self Care | Payer: BLUE CROSS/BLUE SHIELD | Attending: Family Medicine | Admitting: Family Medicine

## 2016-05-22 ENCOUNTER — Encounter: Payer: Self-pay | Admitting: *Deleted

## 2016-05-22 DIAGNOSIS — J020 Streptococcal pharyngitis: Secondary | ICD-10-CM

## 2016-05-22 LAB — RAPID STREP SCREEN (MED CTR MEBANE ONLY): Streptococcus, Group A Screen (Direct): POSITIVE — AB

## 2016-05-22 MED ORDER — ACETAMINOPHEN 160 MG/5ML PO SOLN
650.0000 mg | Freq: Once | ORAL | Status: AC
  Administered 2016-05-22: 640 mg via ORAL

## 2016-05-22 MED ORDER — PENICILLIN G BENZATHINE 1200000 UNIT/2ML IM SUSP
1.2000 10*6.[IU] | Freq: Once | INTRAMUSCULAR | Status: AC
  Administered 2016-05-22: 1.2 10*6.[IU] via INTRAMUSCULAR

## 2016-05-22 MED ORDER — DEXAMETHASONE SODIUM PHOSPHATE 10 MG/ML IJ SOLN: 10.0000 mg | Freq: Once | INTRAMUSCULAR | Status: DC

## 2016-05-22 MED ORDER — DEXAMETHASONE SODIUM PHOSPHATE 10 MG/ML IJ SOLN
10.0000 mg | Freq: Once | INTRAMUSCULAR | Status: AC
  Administered 2016-05-22: 10 mg via INTRAMUSCULAR

## 2016-05-22 NOTE — ED Triage Notes (Signed)
Sore throat x2 days. Denies other symptoms.

## 2016-05-22 NOTE — Discharge Instructions (Signed)
Rest. Drink plenty of fluids.  ° °Follow up with your primary care physician this week as needed. Return to Urgent care for new or worsening concerns.  ° °

## 2016-05-22 NOTE — ED Provider Notes (Signed)
[facility] ____________________________________________  Time seen: Approximately 8:27 AM  I have reviewed the triage vital signs and the nursing notes.   HISTORY  Chief Complaint Sore Throat   HPI Lorraine Holmes is a 45 y.o. female presents for the last sore throat has been present for the last 3 days. Patient reports sore throat worsened yesterday. Patient reports able to continue to drink fluids but hurts to eat and drink. Patient reports her daughters recently sick with similar but denies strep or mono. States minimal nasal congestion, denies cough. Denies known fevers. Denies recent sickness. Denies other known sick contacts. Patient reports primary sore throat.  Denies chest pain, shortness of breath, abdominal pain, dysuria, rash, recent sickness or antibiotic use.  No LMP recorded. Patient is not currently having periods (Reason: IUD). To continency.   Past Medical History:  Diagnosis Date  . Anemia   . Arthritis    Bilateral knee  . History of chicken pox     Patient Active Problem List   Diagnosis Date Noted  . Routine general medical examination at a health care facility 12/21/2013  . Screening for breast cancer 11/23/2013  . Birth control 11/23/2013    Past Surgical History:  Procedure Laterality Date  . FOOT FRACTURE SURGERY Left    2002  . JOINT REPLACEMENT    . KNEE ARTHROSCOPY WITH MEDIAL MENISECTOMY Left 11/28/2014   Procedure: KNEE ARTHROSCOPY WITH MEDIAL MENISECTOMY;  Surgeon: Hessie Knows, MD;  Location: [facility] ORS;  Service: Orthopedics;  Laterality: Left;  Marland Kitchen MEDIAL PARTIAL KNEE REPLACEMENT Right 2013  . meniscus tear Right 2012    Current Outpatient Rx  . Order #: [id number] Class: Historical Med  . Order #: [id number] Class: Normal  . Order #: [id number] Class: Print  . Order #: [id number] Class: Historical Med  . Order #: [id number] Class: Historical Med  . Order #: [id number] Class: Normal  . Order #: [id number] Class: Historical Med  . Order  #: [id number] Class: Historical Med  . Order #: [id number] Class: Historical Med    No current facility-administered medications for this encounter.   Current Outpatient Prescriptions:  .  cetirizine (ZYRTEC) 10 MG tablet, Take 10 mg by mouth daily., Disp: , Rfl:  .  ferrous sulfate 325 (65 FE) MG tablet, TAKE 1 TABLET (325 MG TOTAL) BY MOUTH 2 (TWO) TIMES DAILY., Disp: 60 tablet, Rfl: 3 .  HYDROcodone-acetaminophen (NORCO) 5-325 MG per tablet, Take 1-2 tablets by mouth every 6 (six) hours as needed for moderate pain., Disp: 40 tablet, Rfl: 0 .  levonorgestrel (MIRENA) 20 MCG/24HR IUD, 1 each by Intrauterine route once., Disp: , Rfl:  .  levonorgestrel (MIRENA) 20 MCG/24HR IUD, 1 each by Intrauterine route once., Disp: , Rfl:  .  Levonorgestrel-Ethinyl Estradiol (AMETHIA,CAMRESE) 0.15-0.03 &0.01 MG tablet, Take 1 active pill daily for 12 weeks. (Patient not taking: Reported on 12/03/2014), Disp: 1 Package, Rfl: 4 .  meloxicam (MOBIC) 7.5 MG tablet, Take 7.5 mg by mouth 2 (two) times daily., Disp: , Rfl:  .  naproxen sodium (ANAPROX) 220 MG tablet, Take 220 mg by mouth 2 (two) times daily with a meal., Disp: , Rfl:  .  traMADol (ULTRAM) 50 MG tablet, Take 50 mg by mouth every 6 (six) hours as needed., Disp: , Rfl:   Allergies Patient has no known allergies.  Family History  Problem Relation Age of Onset  . Hypertension Mother   . Diabetes Mother   . Hypertension Father   . Heart disease Father 75    CAD - died [place]  .  Diabetes Maternal Grandmother     Social History Social History  Substance Use Topics  . Smoking status: Never Smoker  . Smokeless tobacco: Never Used  . Alcohol use Yes     Comment: occ    Review of Systems Constitutional: No fever/chills Eyes: No visual changes. ENT: Positive sore throat. Cardiovascular: Denies chest pain. Respiratory: Denies shortness of breath. Gastrointestinal: No abdominal pain.  No nausea, no vomiting.  No diarrhea.  No  constipation. Genitourinary: Negative for dysuria. Musculoskeletal: Negative for back pain. Skin: Negative for rash. Neurological: Negative for headaches, focal weakness or numbness.  10-point ROS otherwise negative.  ____________________________________________   PHYSICAL EXAM:  VITAL SIGNS: ED Triage Vitals  Enc Vitals Group     BP 05/22/16 0819 (!) 141/90     Pulse Rate 05/22/16 0819 (!) 108     Resp 05/22/16 0819 16     Temp 05/22/16 0819 (!) 100.6 F (38.1 C)     Temp Source 05/22/16 0819 Oral     SpO2 05/22/16 0819 97 %     Weight 05/22/16 0820 280 lb (127 kg)     Height 05/22/16 0820 5\' 6"  (1.676 m)     Head Circumference --      Peak Flow --      Pain Score --      Pain Loc --      Pain Edu? --      Excl. in [place]? --    Constitutional: Alert and oriented. Well appearing and in no acute distress. Eyes: Conjunctivae are normal. PERRL. EOMI. Head: Atraumatic. No sinus tenderness to palpation. No swelling. No erythema.  Ears: no erythema, normal TMs bilaterally.   Nose:Nasal congestion with clear rhinorrhea  Mouth/Throat: Mucous membranes are moist. Moderate pharyngeal erythema. 2+ bilateral tonsillar swelling with bilateral exudate. No uvular shift or deviation noted. Neck: No stridor.  No cervical spine tenderness to palpation. Hematological/Lymphatic/Immunilogical: Anterior bilateral cervical lymphadenopathy. Cardiovascular: Normal rate, regular rhythm. Grossly normal heart sounds.  Good peripheral circulation. Respiratory: Normal respiratory effort.  No retractions. Lungs CTAB.No wheezes, rales or rhonchi. Good air movement.  Gastrointestinal: Soft and nontender. Obese abdomen. No hepatosplenomegaly palpated. Musculoskeletal: Steady gait. No cervical, thoracic or lumbar tenderness to palpation. Neurologic:  Normal speech and language. No gross focal neurologic deficits are appreciated. No gait instability. Skin:  Skin is warm, dry and intact. No rash  noted. Psychiatric: Mood and affect are normal. Speech and behavior are normal. ___________________________________________   LABS (all labs ordered are listed, but only abnormal results are displayed)  Labs Reviewed  RAPID STREP SCREEN (NOT AT [facility]) - Abnormal; Notable for the following:       Result Value   Streptococcus, Group A Screen (Direct) POSITIVE (*)    All other components within normal limits     PROCEDURES Procedures     INITIAL IMPRESSION / ASSESSMENT AND PLAN / ED COURSE  Pertinent labs & imaging results that were available during my care of the patient were reviewed by me and considered in my medical decision making (see chart for details).  Very well-appearing patient. No acute distress. Presents with complaints of sore throat. Quick strep positive. Discussed option treatments with patient, and patient requests IM treatment. Bicillin 1.2 million units given once IM. 10 mg IM Decadron once. 650 mg oral Tylenol given once in urgent care. Encourage rest, fluids and supportive care. Work note given for today and tomorrow.  Discussed follow up with Primary care physician this week. Discussed follow up and  return parameters including no resolution or any worsening concerns. Patient verbalized understanding and agreed to plan.   ____________________________________________   FINAL CLINICAL IMPRESSION(S) / ED DIAGNOSES  Final diagnoses:  Strep pharyngitis     New Prescriptions   No medications on file    Note: This dictation was prepared with Dragon dictation along with smaller phrase technology. Any transcriptional errors that result from this process are unintentional.    Clinical Course       Marylene Land, NP 05/22/16 [phone]

## 2016-09-16 ENCOUNTER — Ambulatory Visit
Admission: EM | Admit: 2016-09-16 | Discharge: 2016-09-16 | Disposition: A | Discharge status: Home / Self Care | Payer: BLUE CROSS/BLUE SHIELD | Attending: Family Medicine | Admitting: Family Medicine

## 2016-09-16 DIAGNOSIS — J020 Streptococcal pharyngitis: Secondary | ICD-10-CM

## 2016-09-16 LAB — RAPID STREP SCREEN (MED CTR MEBANE ONLY): STREPTOCOCCUS, GROUP A SCREEN (DIRECT): POSITIVE — AB

## 2016-09-16 MED ORDER — PENICILLIN G BENZATHINE 1200000 UNIT/2ML IM SUSP
1.2000 10*6.[IU] | Freq: Once | INTRAMUSCULAR | Status: AC
  Administered 2016-09-16: 1.2 10*6.[IU] via INTRAMUSCULAR

## 2016-09-16 MED ORDER — IBUPROFEN 800 MG PO TABS
800.0000 mg | ORAL_TABLET | Freq: Once | ORAL | Status: AC
  Administered 2016-09-16: 800 mg via ORAL

## 2016-09-16 NOTE — ED Triage Notes (Signed)
Patient complains of sore throat, chills, painful swallowing, headache x 2-3 days. Patient states that she feels the same way she did in November when she was positive for strep.

## 2016-09-16 NOTE — Discharge Instructions (Signed)
Take medication as prescribed. Rest. Drink plenty of fluids.  ° °Follow up with your primary care physician this week as needed. Return to Urgent care for new or worsening concerns.  ° °

## 2016-09-16 NOTE — ED Provider Notes (Signed)
[facility] ____________________________________________  Time seen: Approximately 9:05 AM  I have reviewed the triage vital signs and the nursing notes.   HISTORY  Chief Complaint Sore Throat   HPI Lorraine Holmes is a 46 y.o. female presents for complaints of sore throat x 2 days. Reports accompanying chills, body aches, fever, and headaches. States mild runny nose. Denies cough. Reports continues to drink fluids well. Reports continues to remain active. Denies known sick contacts. Symptoms unresolved with over the counter alka seltzer cold medication, last took this am.   Denies chest pain, shortness of breath, abdominal pain, dysuria, or rash. Denies recent sickness. Denies recent antibiotic use.   No LMP recorded. Patient is not currently having periods (Reason: IUD).Denies pregnancy.    Past Medical History:  Diagnosis Date  . Anemia   . Arthritis    Bilateral knee  . History of chicken pox     Patient Active Problem List   Diagnosis Date Noted  . Routine general medical examination at a health care facility 12/21/2013  . Screening for breast cancer 11/23/2013  . Birth control 11/23/2013    Past Surgical History:  Procedure Laterality Date  . FOOT FRACTURE SURGERY Left    2002  . JOINT REPLACEMENT    . KNEE ARTHROSCOPY WITH MEDIAL MENISECTOMY Left 11/28/2014   Procedure: KNEE ARTHROSCOPY WITH MEDIAL MENISECTOMY;  Surgeon: Hessie Knows, MD;  Location: [facility] ORS;  Service: Orthopedics;  Laterality: Left;  Marland Kitchen MEDIAL PARTIAL KNEE REPLACEMENT Right 2013  . meniscus tear Right 2012      Current Facility-Administered Medications:  .  penicillin g benzathine (BICILLIN LA) 1200000 UNIT/2ML injection 1.2 Million Units, 1.2 Million Units, Intramuscular, Once, Marylene Land, NP  Current Outpatient Prescriptions:  .  cetirizine (ZYRTEC) 10 MG tablet, Take 10 mg by mouth daily., Disp: , Rfl:  .  levonorgestrel (MIRENA) 20 MCG/24HR IUD, 1 each by Intrauterine  route once., Disp: , Rfl:  .  ferrous sulfate 325 (65 FE) MG tablet, TAKE 1 TABLET (325 MG TOTAL) BY MOUTH 2 (TWO) TIMES DAILY., Disp: 60 tablet, Rfl: 3 .  HYDROcodone-acetaminophen (NORCO) 5-325 MG per tablet, Take 1-2 tablets by mouth every 6 (six) hours as needed for moderate pain., Disp: 40 tablet, Rfl: 0 .  levonorgestrel (MIRENA) 20 MCG/24HR IUD, 1 each by Intrauterine route once., Disp: , Rfl:  .  Levonorgestrel-Ethinyl Estradiol (AMETHIA,CAMRESE) 0.15-0.03 &0.01 MG tablet, Take 1 active pill daily for 12 weeks. (Patient not taking: Reported on 12/03/2014), Disp: 1 Package, Rfl: 4 .  meloxicam (MOBIC) 7.5 MG tablet, Take 7.5 mg by mouth 2 (two) times daily., Disp: , Rfl:  .  naproxen sodium (ANAPROX) 220 MG tablet, Take 220 mg by mouth 2 (two) times daily with a meal., Disp: , Rfl:  .  traMADol (ULTRAM) 50 MG tablet, Take 50 mg by mouth every 6 (six) hours as needed., Disp: , Rfl:   Allergies Patient has no known allergies.  Family History  Problem Relation Age of Onset  . Hypertension Mother   . Diabetes Mother   . Hypertension Father   . Heart disease Father 18    CAD - died [place]  . Diabetes Maternal Grandmother     Social History Social History  Substance Use Topics  . Smoking status: Never Smoker  . Smokeless tobacco: Never Used  . Alcohol use Yes     Comment: occ    Review of Systems Constitutional: As above.  Eyes: No visual changes. ENT: Positive sore throat. Cardiovascular:  Denies chest pain. Respiratory: Denies shortness of breath. Gastrointestinal: No abdominal pain.  No nausea, no vomiting.  No diarrhea.  No constipation. Genitourinary: Negative for dysuria. Musculoskeletal: Negative for back pain. Skin: Negative for rash. Neurological: Negative for headaches, focal weakness or numbness.  10-point ROS otherwise negative.  ____________________________________________   PHYSICAL EXAM:  VITAL SIGNS: ED Triage Vitals  Enc Vitals Group     BP 09/16/16  0852 (!) 158/78     Pulse Rate 09/16/16 0852 (!) 109     Resp 09/16/16 0852 18     Temp 09/16/16 0852 (!) 100.9 F (38.3 C)     Temp Source 09/16/16 0852 Oral     SpO2 09/16/16 0852 100 %     Weight 09/16/16 0851 285 lb (129.3 kg)     Height 09/16/16 0851 5\' 6"  (1.676 m)     Head Circumference --      Peak Flow --      Pain Score 09/16/16 0853 10     Pain Loc --      Pain Edu? --      Excl. in [place]? --     Constitutional: Alert and oriented. Well appearing and in no acute distress. Eyes: Conjunctivae are normal. PERRL. EOMI. Head: Atraumatic. No sinus tenderness to palpation. No swelling. No erythema.  Ears: no erythema, normal TMs bilaterally.   Nose:No nasal congestion or rhinorrhea.   Mouth/Throat: Mucous membranes are moist. Moderate pharyngeal erythema. Mild bilateral tonsillar swelling. No exudate. No uvular shift or deviation. Neck: No stridor.  No cervical spine tenderness to palpation. Hematological/Lymphatic/Immunilogical: mild anterior bilateral cervical lymphadenopathy. Cardiovascular: Normal rate, regular rhythm. Grossly normal heart sounds.  Good peripheral circulation. Respiratory: Normal respiratory effort.  No retractions. No wheezes, rales or rhonchi. Good air movement.  Gastrointestinal: Soft and nontender.  Musculoskeletal: Ambulatory with steady gait. Neurologic:  Normal speech and language. No gait instability. Skin:  Skin appears warm, dry and intact. No rash noted. Psychiatric: Mood and affect are normal. Speech and behavior are normal.  ___________________________________________   LABS (all labs ordered are listed, but only abnormal results are displayed)  Labs Reviewed  RAPID STREP SCREEN (NOT AT [facility]) - Abnormal; Notable for the following:       Result Value   Streptococcus, Group A Screen (Direct) POSITIVE (*)    All other components within normal limits    PROCEDURES Procedures    INITIAL IMPRESSION / ASSESSMENT AND PLAN / ED  COURSE  Pertinent labs & imaging results that were available during my care of the patient were reviewed by me and considered in my medical decision making (see chart for details).  Well appearing patient, no acute distress. Strep positive. Will treat with Bicillin IM, Bicillin 1.2 million units given once in urgent care. Discussed indication, risks and benefits of medications with patient.  Discussed follow up with Primary care physician this week. Discussed follow up and return parameters including no resolution or any worsening concerns. Patient verbalized understanding and agreed to plan.   ____________________________________________   FINAL CLINICAL IMPRESSION(S) / ED DIAGNOSES  Final diagnoses:  Strep pharyngitis     New Prescriptions   No medications on file    Note: This dictation was prepared with Dragon dictation along with smaller phrase technology. Any transcriptional errors that result from this process are unintentional.         Marylene Land, NP 09/16/16 [phone]

## 2017-02-25 ENCOUNTER — Encounter: Payer: Self-pay | Admitting: Family

## 2017-02-25 ENCOUNTER — Ambulatory Visit (INDEPENDENT_AMBULATORY_CARE_PROVIDER_SITE_OTHER): Payer: BLUE CROSS/BLUE SHIELD | Admitting: Family

## 2017-02-25 VITALS — BP 144/92 | HR 79 | Temp 98.2°F | Ht 65.75 in | Wt 297.1 lb

## 2017-02-25 DIAGNOSIS — E669 Obesity, unspecified: Secondary | ICD-10-CM

## 2017-02-25 DIAGNOSIS — R030 Elevated blood-pressure reading, without diagnosis of hypertension: Secondary | ICD-10-CM

## 2017-02-25 NOTE — Patient Instructions (Addendum)
Monitor blood pressure- goal < 130/80.   If persisently high, please let me know!  Due for pap. Please return for physical and fasting labs.  This is  Dr. Lupita Dawn  example of a  "Low GI"  Diet:  It will allow you to lose 4 to 8  lbs  per month if you follow it carefully.  Your goal with exercise is a minimum of 30 minutes of aerobic exercise 5 days per week (Walking does not count once it becomes easy!)    All of the foods can be found at grocery stores and in bulk at [employer].  The Atkins protein bars and shakes are available in more varieties at [employer], [employer] and [place].     7 AM Breakfast:  Choose from the following:  Low carbohydrate Protein  Shakes (I recommend the  Premier Protein chocolate shakes,  EAS AdvantEdge "Carb Control" shakes  Or the Atkins shakes all are under 3 net carbs)     a scrambled egg/bacon/cheese burrito made with Mission's "carb balance" whole wheat tortilla  (about 10 net carbs )  Regulatory affairs officer (basically a quiche without the pastry crust) that is eaten cold and very convenient way to get your eggs.  8 carbs)  If you make your own protein shakes, avoid bananas and pineapple,  And use low carb greek yogurt or original /unsweetened almond or soy milk    Avoid cereal and bananas, oatmeal and cream of wheat and grits. They are loaded with carbohydrates!   10 AM: high protein snack:  Protein bar by Atkins (the snack size, under 200 cal, usually < 6 net carbs).    A stick of cheese:  Around 1 carb,  100 cal     Dannon Light n Fit [place] Yogurt  (80 cal, 8 carbs)  Other so called "protein bars" and Greek yogurts tend to be loaded with carbohydrates.  Remember, in food advertising, the word "energy" is synonymous for " carbohydrate."  Lunch:   A Sandwich using the bread choices listed, Can use any  Eggs,  lunchmeat, grilled meat or canned tuna), avocado, regular mayo/mustard  and cheese.  A Salad using blue cheese, ranch,  Goddess or  vinagrette,  Avoid taco shells, croutons or "confetti" and no "candied nuts" but regular nuts OK.   No pretzels, nabs  or chips.  Pickles and miniature sweet peppers are a good low carb alternative that provide a "crunch"  The bread is the only source of carbohydrate in a sandwich and  can be decreased by trying some of the attached alternatives to traditional loaf bread   Avoid "Low fat dressings, as well as [place] dressings They are loaded with sugar!   3 PM/ Mid day  Snack:  Consider  1 ounce of  almonds, walnuts, pistachios, pecans, peanuts,  Macadamia nuts or a nut medley.  Avoid "granola and granola bars "  Mixed nuts are ok in moderation as long as there are no raisins,  cranberries or dried fruit.   KIND bars are OK if you get the low glycemic index variety   Try the prosciutto/mozzarella cheese sticks by Fior[employer]ci  In deli /backery section   High protein      6 PM  Dinner:     Meat/fowl/fish with a green salad, and either broccoli, cauliflower, green beans, spinach, brussel sprouts or  Lima beans. DO NOT BREAD THE PROTEIN!!      There is a low  carb pasta by Dreamfield's that is acceptable and tastes great: only 5 digestible carbs/serving.( All grocery stores but BJs carry it ) Several ready made meals are available low carb:   Try Michel Angelo's chicken piccata or chicken or eggplant parm over low carb pasta.(Lowes and BJs)   Marjory Lies Sanchez's "Carnitas" (pulled pork, no sauce,  0 carbs) or his beef pot roast to make a dinner burrito (at [employer]J's)  Pesto over low carb pasta (bj's sells a good quality pesto in the center refrigerated section of the deli   Try satueeing  Cheral Marker with mushroooms as a good side   [employer] Giant makes a mashed cauliflower that tastes like mashed potatoes  Whole wheat pasta is still full of digestible carbs and  Not as low in glycemic index as Dreamfield's.   Brown rice is still rice,  So skip the rice and noodles if you eat [place] or [place]  (or at least limit to 1/2 cup)  9 PM snack :   Breyer's "low carb" fudgsicle or  ice cream bar (Carb Smart line), or  Weight Watcher's ice cream bar , or another "no sugar added" ice cream;  a serving of fresh berries/cherries with whipped cream   Cheese or DANNON'S LlGHT N FIT GREEK YOGURT  8 ounces of [place] unsweetened almond/cococunut milk    Treat yourself to a parfait made with whipped cream blueberiies, walnuts and vanilla greek yogurt  Avoid bananas, pineapple, grapes  and watermelon on a regular basis because they are high in sugar.  THINK OF THEM AS DESSERT  Remember that snack Substitutions should be less than 10 NET carbs per serving and meals < 20 carbs. Remember to subtract fiber grams to get the "net carbs."  @TULLOBREADPACKAGE @

## 2017-02-25 NOTE — Progress Notes (Signed)
Subjective:    Patient ID: Lorraine Holmes, female    DOB: January 26, 1971, 46 y.o.   MRN: [id number]  CC: Lorraine Holmes is a 46 y.o. female who presents today to establish care.    HPI: New patient , no prior pcp  Follows with Lorraine Holmes, CNM  H/o gestational HTN. At home, gets 120/75. Denies exertional chest pain or pressure, numbness or tingling radiating to left arm or jaw, palpitations, dizziness, frequent headaches, changes in vision, or shortness of breath.   Complains of weight Gain, fluctuates. Tends to eat more take out. Unable to exercise. Interested in weight loss surgery. Lost 30 pounds last summer when doing a weight loss, meal prep service. Focused on water at that time. No anxiety or depression.   Also complains of left knee pain from weight gain, follows with orthopedics for this, Dr Rudene Christians, and now told he she needs TKR of left knee. Would like to loose weight prior to surgery.  2016 left meniscal repair; partial knee right replacement 2012     HISTORY:  Past Medical History:  Diagnosis Date  . Anemia   . Arthritis    Bilateral knee  . History of chicken pox    Past Surgical History:  Procedure Laterality Date  . FOOT FRACTURE SURGERY Left    2002  . JOINT REPLACEMENT    . KNEE ARTHROSCOPY WITH MEDIAL MENISECTOMY Left 11/28/2014   Procedure: KNEE ARTHROSCOPY WITH MEDIAL MENISECTOMY;  Surgeon: Hessie Knows, MD;  Location: [facility] ORS;  Service: Orthopedics;  Laterality: Left;  Lorraine Holmes MEDIAL PARTIAL KNEE REPLACEMENT Right 2013  . meniscus tear Right 2012   Family History  Problem Relation Age of Onset  . Hypertension Mother   . Diabetes Mother   . Hypertension Father   . Heart disease Father 64       CAD - died MI  . Diabetes Maternal Grandmother     Allergies: Patient has no known allergies. Current Outpatient Prescriptions on File Prior to Visit  Medication Sig Dispense Refill  . cetirizine (ZYRTEC) 10 MG tablet Take 10 mg by mouth daily.    Lorraine Holmes levonorgestrel (MIRENA)  20 MCG/24HR IUD 1 each by Intrauterine route once.    . meloxicam (MOBIC) 7.5 MG tablet Take 7.5 mg by mouth 2 (two) times daily.     No current facility-administered medications on file prior to visit.     Social History  Substance Use Topics  . Smoking status: Never Smoker  . Smokeless tobacco: Never Used  . Alcohol use Yes     Comment: occ    Review of Systems  Constitutional: Negative for chills and fever.  Eyes: Negative for visual disturbance.  Respiratory: Negative for cough.   Cardiovascular: Negative for chest pain and palpitations.  Gastrointestinal: Negative for nausea and vomiting.  Musculoskeletal: Negative for back pain and joint swelling.  Neurological: Negative for headaches.      Objective:    BP (!) 144/92   Pulse 79   Temp 98.2 F (36.8 C) (Oral)   Ht 5' 5.75" (1.67 m)   Wt 297 lb 2 oz (134.8 kg)   SpO2 99%   BMI 48.32 kg/m  BP Readings from Last 3 Encounters:  02/25/17 (!) 144/92  09/16/16 (!) 158/78  05/22/16 (!) 141/90   Wt Readings from Last 3 Encounters:  02/25/17 297 lb 2 oz (134.8 kg)  09/16/16 285 lb (129.3 kg)  05/22/16 280 lb (127 kg)    Physical Exam  Constitutional: She appears  well-developed and well-nourished.  Eyes: Conjunctivae are normal.  Cardiovascular: Normal rate, regular rhythm, normal heart sounds and normal pulses.   Pulmonary/Chest: Effort normal and breath sounds normal. She has no wheezes. She has no rhonchi. She has no rales.  Neurological: She is alert.  Skin: Skin is warm and dry.  Psychiatric: She has a normal mood and affect. Her speech is normal and behavior is normal. Thought content normal.  Vitals reviewed.      Assessment & Plan:   Problem List Items Addressed This Visit      Other   Obesity - Primary    Chronic. Ready for change. Education and counseling provided on low glycemic index diet. Referral to nutrition. Patient will follow with me, we will discuss wellbutrin at next visit if she has had  significant weight loss. Will return for CPE, labs, pap.      Relevant Orders   Referral to Nutrition and Diabetes Services   Elevated blood pressure reading    Elevated today. H/o gestational Dm. Advised to start medication, she declines today.   patient prefers to monitor blood pressure and focus on weight loss. Will follow.           I have discontinued [name]'s naproxen sodium, Levonorgestrel-Ethinyl Estradiol, traMADol, ferrous sulfate, and HYDROcodone-acetaminophen. I am also having her maintain her cetirizine, meloxicam, and levonorgestrel.   No orders of the defined types were placed in this encounter.   Return precautions given.   Risks, benefits, and alternatives of the medications and treatment plan prescribed today were discussed, and patient expressed understanding.   Education regarding symptom management and diagnosis given to patient on AVS.  Continue to follow with Burnard Hawthorne, FNP for routine health maintenance.   Kyrah D Hosack and I agreed with plan.   Lorraine Paris, FNP

## 2017-02-26 DIAGNOSIS — R030 Elevated blood-pressure reading, without diagnosis of hypertension: Secondary | ICD-10-CM | POA: Insufficient documentation

## 2017-02-26 DIAGNOSIS — I10 Essential (primary) hypertension: Secondary | ICD-10-CM | POA: Insufficient documentation

## 2017-02-26 DIAGNOSIS — E669 Obesity, unspecified: Secondary | ICD-10-CM | POA: Insufficient documentation

## 2017-02-26 NOTE — Assessment & Plan Note (Signed)
Chronic. Ready for change. Education and counseling provided on low glycemic index diet. Referral to nutrition. Patient will follow with me, we will discuss wellbutrin at next visit if she has had significant weight loss. Will return for CPE, labs, pap.

## 2017-02-26 NOTE — Assessment & Plan Note (Signed)
Elevated today. H/o gestational Dm. Advised to start medication, she declines today.   patient prefers to monitor blood pressure and focus on weight loss. Will follow.

## 2017-03-19 ENCOUNTER — Ambulatory Visit (INDEPENDENT_AMBULATORY_CARE_PROVIDER_SITE_OTHER): Payer: BLUE CROSS/BLUE SHIELD | Admitting: Family

## 2017-03-19 ENCOUNTER — Encounter: Payer: BLUE CROSS/BLUE SHIELD | Attending: Family | Admitting: Dietician

## 2017-03-19 ENCOUNTER — Encounter: Payer: Self-pay | Admitting: Dietician

## 2017-03-19 ENCOUNTER — Other Ambulatory Visit (HOSPITAL_COMMUNITY)
Admission: RE | Admit: 2017-03-19 | Discharge: 2017-03-19 | Disposition: A | Discharge status: Home / Self Care | Payer: BLUE CROSS/BLUE SHIELD | Source: Ambulatory Visit | Attending: Family | Admitting: Family

## 2017-03-19 ENCOUNTER — Encounter: Payer: Self-pay | Admitting: Family

## 2017-03-19 VITALS — BP 142/86 | HR 79 | Temp 98.2°F | Ht 65.75 in | Wt 289.0 lb

## 2017-03-19 VITALS — Ht 65.0 in | Wt 288.2 lb

## 2017-03-19 DIAGNOSIS — Z0000 Encounter for general adult medical examination without abnormal findings: Secondary | ICD-10-CM | POA: Diagnosis not present

## 2017-03-19 DIAGNOSIS — E669 Obesity, unspecified: Secondary | ICD-10-CM | POA: Diagnosis not present

## 2017-03-19 DIAGNOSIS — R030 Elevated blood-pressure reading, without diagnosis of hypertension: Secondary | ICD-10-CM | POA: Diagnosis not present

## 2017-03-19 DIAGNOSIS — Z6841 Body Mass Index (BMI) 40.0 and over, adult: Secondary | ICD-10-CM | POA: Diagnosis not present

## 2017-03-19 LAB — CBC WITH DIFFERENTIAL/PLATELET
BASOS PCT: 1.3 % (ref 0.0–3.0)
Basophils Absolute: 0.1 10*3/uL (ref 0.0–0.1)
EOS ABS: 0.1 10*3/uL (ref 0.0–0.7)
Eosinophils Relative: 1.4 % (ref 0.0–5.0)
HCT: 41.1 % (ref 36.0–46.0)
Hemoglobin: 13.2 g/dL (ref 12.0–15.0)
LYMPHS ABS: 2.5 10*3/uL (ref 0.7–4.0)
Lymphocytes Relative: 30.1 % (ref 12.0–46.0)
MCHC: 32.1 g/dL (ref 30.0–36.0)
MCV: 89.3 fl (ref 78.0–100.0)
MONO ABS: 0.5 10*3/uL (ref 0.1–1.0)
Monocytes Relative: 5.9 % (ref 3.0–12.0)
NEUTROS PCT: 61.3 % (ref 43.0–77.0)
Neutro Abs: 5.1 10*3/uL (ref 1.4–7.7)
Platelets: 292 10*3/uL (ref 150.0–400.0)
RBC: 4.61 Mil/uL (ref 3.87–5.11)
RDW: 14.6 % (ref 11.5–15.5)
WBC: 8.4 10*3/uL (ref 4.0–10.5)

## 2017-03-19 LAB — COMPREHENSIVE METABOLIC PANEL
ALT: 10 U/L (ref 0–35)
AST: 13 U/L (ref 0–37)
Albumin: 3.9 g/dL (ref 3.5–5.2)
Alkaline Phosphatase: 72 U/L (ref 39–117)
BUN: 9 mg/dL (ref 6–23)
CHLORIDE: 107 meq/L (ref 96–112)
CO2: 27 meq/L (ref 19–32)
CREATININE: 0.68 mg/dL (ref 0.40–1.20)
Calcium: 9.7 mg/dL (ref 8.4–10.5)
GFR: 118.61 mL/min (ref >60.00)
GLUCOSE: 90 mg/dL (ref 70–99)
Potassium: 4.3 mEq/L (ref 3.5–5.1)
SODIUM: 141 meq/L (ref 135–145)
Total Bilirubin: 0.5 mg/dL (ref 0.2–1.2)
Total Protein: 6.9 g/dL (ref 6.0–8.3)

## 2017-03-19 LAB — IBC PANEL
IRON: 83 ug/dL (ref 42–145)
SATURATION RATIOS: 27.4 % (ref 20.0–50.0)
TRANSFERRIN: 216 mg/dL (ref 212.0–360.0)

## 2017-03-19 LAB — TSH: TSH: 0.7 u[IU]/mL (ref 0.35–4.50)

## 2017-03-19 LAB — FERRITIN: FERRITIN: 83.9 ng/mL (ref 10.0–291.0)

## 2017-03-19 LAB — VITAMIN D 25 HYDROXY (VIT D DEFICIENCY, FRACTURES): VITD: 14.06 ng/mL — ABNORMAL LOW (ref 30.00–100.00)

## 2017-03-19 LAB — HEMOGLOBIN A1C: Hgb A1c MFr Bld: 5.6 % (ref 4.6–6.5)

## 2017-03-19 MED ORDER — AMLODIPINE BESYLATE 2.5 MG PO TABS: 2.5000 mg | ORAL_TABLET | Freq: Every day | ORAL | 3 refills | Status: DC

## 2017-03-19 MED ORDER — BUPROPION HCL ER (XL) 150 MG PO TB24: 150.0000 mg | ORAL_TABLET | Freq: Every day | ORAL | 0 refills | Status: DC

## 2017-03-19 NOTE — Assessment & Plan Note (Signed)
Pap and clinical breast exam performed. Patient understands to schedule mammogram. Advised to get tdap at local pharmacy. Screening labs ordered.

## 2017-03-19 NOTE — Patient Instructions (Signed)
   Allow for some healthy carbs with meals.   Eat generous portions of "free" vegetables with meals while controlling portions of starchy foods.   Use a smaller coffee cup to use less sugar; gradually cut back the amount of sugar added.   Keep up your great work!

## 2017-03-19 NOTE — Progress Notes (Signed)
Medical Nutrition Therapy: Visit start time: 2751  end time: 1650  Assessment:  Diagnosis: obesity Past medical history: partial knee replacement, arthritis in knees Psychosocial issues/ stress concerns: none Preferred learning method:  . Auditory . Visual . Hands-on . No preference indicated  Current weight: 288.2lbs  Height: 5'5" Medications, supplements: reconciled list in medical record  Progress and evaluation: Patient reports gradual weight gain since having knee issues years ago, resorting to less activity, and quicker meals. She has started a high protein, low carb diet about 2-3 weeks ago and has lost 9lbs. She has decreased intake of regular sodas, starches and fruits. Her mother does some meal prep in the evenings, and there are 2 small children in the home; Ms. Burkholder seeks help with strategies to meet her weight loss plan and also work for her family.   Physical activity: limited due to knee pain  Dietary Intake:  Usual eating pattern includes 3 meals and 1 snacks per day. Dining out frequency: 3-4 meals per week.  Breakfast: plain [place] yogurt, adds splenda; or eggs with 1slice toast Snack: grapes, mixed nuts, or cheese stick, or [place] protein pack with nuts, cheese, dried fruit Lunch: salad with chicken Snack: none Supper: mother cooks, eats small portions; i.e. Grilled pork chop, 1/2 baked sweet potato, plantain Snack: none Beverages: aiming for 1/2 body weight in oz of water; large cup coffee daily with 3 Tbsp. sugar  Nutrition Care Education: Topics covered: weight control Basic nutrition: basic food groups, appropriate nutrient balance, appropriate meal and snack schedule, general nutrition guidelines    Weight control: benefits of weight control, healthy balance of Carb., protein, and fat and provided guidance for 1500kcal with 40%CHO, 30% protein, and 30% fat to meet patient's preferences. Discussed importance of portion control, and whole grain/ high fiber carb  choices. Used plate method and food models to demonstrate basic meal planning; discussed balanced meal options for the family. Discussed importance of flexibility with eating pattern and food choices to foster long-term weight loss success and lifelong healthy habits.  Advanced nutrition: cooking techniques, food label reading  Nutritional Diagnosis:  [place]-3.3 Overweight/obesity As related to excess calories, inactivity.  As evidenced by BMI 48, patient report.  Intervention: Instruction as noted above.   Patient has already made significant diet changes.    Set goals with input from patient.   She declined scheduling any follow-up at this time, but will schedule later if needed.  Education Materials given:  . Food lists/ Planning A Balanced Meal . Sample meal pattern/ menus . Goals/ instructions   Learner/ who was taught:  . Patient   Level of understanding: Marland Kitchen Verbalizes/ demonstrates competency  Demonstrated degree of understanding via:   Teach back Learning barriers: . None  Willingness to learn/ readiness for change: . Eager, change in progress   Monitoring and Evaluation:  Dietary intake, exercise, and body weight      follow up: prn

## 2017-03-19 NOTE — Assessment & Plan Note (Signed)
Congratulated patient on weight loss. Agreed that we will start Wellbutrin at this time. Follow-up in a couple months to check on weight.

## 2017-03-19 NOTE — Progress Notes (Signed)
Subjective:    Patient ID: Lorraine Holmes, female    DOB: Apr 21, 1971, 46 y.o.   MRN: [id number]  CC: Lorraine Holmes is a 46 y.o. female who presents today for physical exam.    HPI: HTN- 135/80. Denies exertional chest pain or pressure, numbness or tingling radiating to left arm or jaw, palpitations, dizziness, frequent headaches, changes in vision, or shortness of breath.   Has intentionally lost 10 pounds since  last visit. Has increased exercise, and following healthier diet.Desires to start Wellbutrin       Colorectal Cancer Screening: No early family history Breast Cancer Screening: Mammogram due Cervical Cancer Screening: Due Bone Health screening/DEXA for 65+: No increased fracture risk. Defer screening at this time. Lung Cancer Screening: Doesn't have 30 year pack year history and age > 44 years.       Tetanus - due       HIV Screening- Candidate for, consents Labs: Screening labs today. Exercise: Gets regular exercise.  Alcohol use: occasional Smoking/tobacco use: Nonsmoker.  Regular dental exams: UTD Wears seat belt: Yes. Skin: no new skin lesions, no h/o skin cancer  HISTORY:  Past Medical History:  Diagnosis Date  . Anemia   . Arthritis    Bilateral knee  . History of chicken pox     Past Surgical History:  Procedure Laterality Date  . FOOT FRACTURE SURGERY Left    2002  . JOINT REPLACEMENT    . KNEE ARTHROSCOPY WITH MEDIAL MENISECTOMY Left 11/28/2014   Procedure: KNEE ARTHROSCOPY WITH MEDIAL MENISECTOMY;  Surgeon: Hessie Knows, MD;  Location: [facility] ORS;  Service: Orthopedics;  Laterality: Left;  Marland Kitchen MEDIAL PARTIAL KNEE REPLACEMENT Right 2013  . meniscus tear Right 2012   Family History  Problem Relation Age of Onset  . Hypertension Mother   . Diabetes Mother   . Hypertension Father   . Heart disease Father 54       CAD - died MI  . Diabetes Maternal Grandmother   . Colon cancer Neg Hx       ALLERGIES: Patient has no known allergies.  Current  Outpatient Prescriptions on File Prior to Visit  Medication Sig Dispense Refill  . cetirizine (ZYRTEC) 10 MG tablet Take 10 mg by mouth daily.    Marland Kitchen levonorgestrel (MIRENA) 20 MCG/24HR IUD 1 each by Intrauterine route once.    . meloxicam (MOBIC) 7.5 MG tablet Take 7.5 mg by mouth 2 (two) times daily.     No current facility-administered medications on file prior to visit.     Social History  Substance Use Topics  . Smoking status: Never Smoker  . Smokeless tobacco: Never Used  . Alcohol use Yes     Comment: occ    Review of Systems  Constitutional: Negative for chills, fever and unexpected weight change.  HENT: Negative for congestion.   Respiratory: Negative for cough.   Cardiovascular: Negative for chest pain, palpitations and leg swelling.  Gastrointestinal: Negative for nausea and vomiting.  Musculoskeletal: Negative for arthralgias and myalgias.  Skin: Negative for rash.  Neurological: Negative for headaches.  Hematological: Negative for adenopathy.  Psychiatric/Behavioral: Negative for confusion.      Objective:    BP (!) 142/86   Pulse 79   Temp 98.2 F (36.8 C) (Oral)   Ht 5' 5.75" (1.67 m)   Wt 289 lb (131.1 kg)   SpO2 98%   BMI 47.00 kg/m   BP Readings from Last 3 Encounters:  03/19/17 (!) 142/86  02/25/17 Marland Kitchen)  144/92  09/16/16 (!) 158/78   Wt Readings from Last 3 Encounters:  03/19/17 289 lb (131.1 kg)  02/25/17 297 lb 2 oz (134.8 kg)  09/16/16 285 lb (129.3 kg)    Physical Exam  Constitutional: She appears well-developed and well-nourished.  Eyes: Conjunctivae are normal.  Neck: No thyroid mass and no thyromegaly present.  Cardiovascular: Normal rate, regular rhythm, normal heart sounds and normal pulses.   Pulmonary/Chest: Effort normal and breath sounds normal. She has no wheezes. She has no rhonchi. She has no rales. Right breast exhibits no inverted nipple, no mass, no nipple discharge, no skin change and no tenderness. Left breast exhibits no  inverted nipple, no mass, no nipple discharge, no skin change and no tenderness. Breasts are symmetrical.  No masses or asymmetry appreciated during CBE.  Genitourinary: Uterus is not enlarged, not fixed and not tender. Cervix exhibits no motion tenderness, no discharge and no friability. Right adnexum displays no mass, no tenderness and no fullness. Left adnexum displays no mass, no tenderness and no fullness.  Genitourinary Comments: Pap performed. No CMT. Unable to appreciated ovaries.  Lymphadenopathy:       Head (right side): No submental, no submandibular, no tonsillar, no preauricular, no posterior auricular and no occipital adenopathy present.       Head (left side): No submental, no submandibular, no tonsillar, no preauricular, no posterior auricular and no occipital adenopathy present.       Right cervical: No superficial cervical, no deep cervical and no posterior cervical adenopathy present.      Left cervical: No superficial cervical, no deep cervical and no posterior cervical adenopathy present.    She has no axillary adenopathy.       Right axillary: No pectoral and no lateral adenopathy present.       Left axillary: No pectoral and no lateral adenopathy present. Neurological: She is alert.  Skin: Skin is warm and dry.  Psychiatric: She has a normal mood and affect. Her speech is normal and behavior is normal. Thought content normal.  Vitals reviewed.      Assessment & Plan:   Problem List Items Addressed This Visit      Other   Routine general medical examination at a health care facility - Primary    Pap and clinical breast exam performed. Patient understands to schedule mammogram. Advised to get tdap at local pharmacy. Screening labs ordered.       Relevant Orders   CBC with Differential/Platelet   Comprehensive metabolic panel   HIV antibody   Hemoglobin A1c   Cytology - PAP   TSH   VITAMIN D 25 Hydroxy (Vit-D Deficiency, Fractures)   Ferritin   IBC panel   MM  SCREENING BREAST TOMO BILATERAL   Obesity    Congratulated patient on weight loss. Agreed that we will start Wellbutrin at this time. Follow-up in a couple months to check on weight.      Relevant Medications   buPROPion (WELLBUTRIN XL) 150 MG 24 hr tablet   Elevated blood pressure reading    Continues to be elevated. We'll start low-dose amlodipine. Discussed with patient at great length that likely as she continues to lose weight, we can stop this medication however we'll continue to monitor.      Relevant Medications   amLODipine (NORVASC) 2.5 MG tablet       I am having Ms. Paulino start on buPROPion and amLODipine. I am also having her maintain her cetirizine, meloxicam, and levonorgestrel.  Meds ordered this encounter  Medications  . buPROPion (WELLBUTRIN XL) 150 MG 24 hr tablet    Sig: Take 1 tablet (150 mg total) by mouth daily. Take one tablet by mouth every morning for 7 days, and then increase to two tablets by mouth every morning.    Dispense:  90 tablet    Refill:  0    Order Specific Question:   Supervising Provider    Answer:   Deborra Medina L [[id number]]  . amLODipine (NORVASC) 2.5 MG tablet    Sig: Take 1 tablet (2.5 mg total) by mouth daily.    Dispense:  90 tablet    Refill:  3    Order Specific Question:   Supervising Provider    Answer:   Crecencio Mc [[id number]]    Return precautions given.   Risks, benefits, and alternatives of the medications and treatment plan prescribed today were discussed, and patient expressed understanding.   Education regarding symptom management and diagnosis given to patient on AVS.   Continue to follow with Burnard Hawthorne, FNP for routine health maintenance.   Temperence D Shands and I agreed with plan.   Mable Paris, FNP

## 2017-03-19 NOTE — Patient Instructions (Addendum)
Labs today  Tdap vaccine at local pharmacy  Start wellbutrin  Start amlodipine 2.5 once per day; Monitor blood pressure,  Goal is less than 130/80; if persistently higher, please make sooner follow up appointment so we can recheck you blood pressure and manage medications  We placed a referral for mammogram this year. I asked that you call one the below locations and schedule this when it is convenient for you.   As discussed, I would like you to ask for 3D mammogram over the traditional 2D mammogram as new evidence suggest 3D is superior.   Please note that NOT all insurance companies cover 3D and you may have to pay a higher copay. You may call your insurance company to further clarify your benefits.   Options for [place]  [place], [place]  * [facility] [place], [place] * Note if you ask for 3D mammogram at this location, you must request [place], [place] location*     Health Maintenance, Female Adopting a healthy lifestyle and getting preventive care can go a long way to promote health and wellness. Talk with your health care provider about what schedule of regular examinations is right for you. This is a good chance for you to check in with your provider about disease prevention and staying healthy. In between checkups, there are plenty of things you can do on your own. Experts have done a lot of research about which lifestyle changes and preventive measures are most likely to keep you healthy. Ask your health care provider for more information. Weight and diet Eat a healthy diet  Be sure to include plenty of vegetables, fruits, low-fat dairy products, and lean protein.  Do not eat a lot of foods high in solid fats, added sugars, or salt.  Get regular exercise. This is one of the most important things you can do for your  health. ? Most adults should exercise for at least 150 minutes each week. The exercise should increase your heart rate and make you sweat (moderate-intensity exercise). ? Most adults should also do strengthening exercises at least twice a week. This is in addition to the moderate-intensity exercise.  Maintain a healthy weight  Body mass index (BMI) is a measurement that can be used to identify possible weight problems. It estimates body fat based on height and weight. Your health care provider can help determine your BMI and help you achieve or maintain a healthy weight.  For females 8 years of age and older: ? A BMI below 18.5 is considered underweight. ? A BMI of 18.5 to 24.9 is normal. ? A BMI of 25 to 29.9 is considered overweight. ? A BMI of 30 and above is considered obese.  Watch levels of cholesterol and blood lipids  You should start having your blood tested for lipids and cholesterol at 48 years of age, then have this test every 5 years.  You may need to have your cholesterol levels checked more often if: ? Your lipid or cholesterol levels are high. ? You are older than 48 years of age. ? You are at high risk for heart disease.  Cancer screening Lung Cancer  Lung cancer screening is recommended for adults 1-3 years old who are at high risk for lung cancer because of a history of smoking.  A yearly low-dose CT scan of the lungs is  recommended for people who: ? Currently smoke. ? Have quit within the past 15 years. ? Have at least a 30-pack-year history of smoking. A pack year is smoking an average of one pack of cigarettes a day for 1 year.  Yearly screening should continue until it has been 15 years since you quit.  Yearly screening should stop if you develop a health problem that would prevent you from having lung cancer treatment.  Breast Cancer  Practice breast self-awareness. This means understanding how your breasts normally appear and feel.  It also means  doing regular breast self-exams. Let your health care provider know about any changes, no matter how small.  If you are in your 20s or 30s, you should have a clinical breast exam (CBE) by a health care provider every 1-3 years as part of a regular health exam.  If you are 68 or older, have a CBE every year. Also consider having a breast X-ray (mammogram) every year.  If you have a family history of breast cancer, talk to your health care provider about genetic screening.  If you are at high risk for breast cancer, talk to your health care provider about having an MRI and a mammogram every year.  Breast cancer gene (BRCA) assessment is recommended for women who have family members with BRCA-related cancers. BRCA-related cancers include: ? Breast. ? Ovarian. ? Tubal. ? Peritoneal cancers.  Results of the assessment will determine the need for genetic counseling and BRCA1 and BRCA2 testing.  Cervical Cancer Your health care provider may recommend that you be screened regularly for cancer of the pelvic organs (ovaries, uterus, and vagina). This screening involves a pelvic examination, including checking for microscopic changes to the surface of your cervix (Pap test). You may be encouraged to have this screening done every 3 years, beginning at age 53.  For women ages 36-65, health care providers may recommend pelvic exams and Pap testing every 3 years, or they may recommend the Pap and pelvic exam, combined with testing for human papilloma virus (HPV), every 5 years. Some types of HPV increase your risk of cervical cancer. Testing for HPV may also be done on women of any age with unclear Pap test results.  Other health care providers may not recommend any screening for nonpregnant women who are considered low risk for pelvic cancer and who do not have symptoms. Ask your health care provider if a screening pelvic exam is right for you.  If you have had past treatment for cervical cancer or a  condition that could lead to cancer, you need Pap tests and screening for cancer for at least 20 years after your treatment. If Pap tests have been discontinued, your risk factors (such as having a new sexual partner) need to be reassessed to determine if screening should resume. Some women have medical problems that increase the chance of getting cervical cancer. In these cases, your health care provider may recommend more frequent screening and Pap tests.  Colorectal Cancer  This type of cancer can be detected and often prevented.  Routine colorectal cancer screening usually begins at 48 years of age and continues through 48 years of age.  Your health care provider may recommend screening at an earlier age if you have risk factors for colon cancer.  Your health care provider may also recommend using home test kits to check for hidden blood in the stool.  A small camera at the end of a tube can be used to examine your  colon directly (sigmoidoscopy or colonoscopy). This is done to check for the earliest forms of colorectal cancer.  Routine screening usually begins at age 23.  Direct examination of the colon should be repeated every 5-10 years through 48 years of age. However, you may need to be screened more often if early forms of precancerous polyps or small growths are found.  Skin Cancer  Check your skin from head to toe regularly.  Tell your health care provider about any new moles or changes in moles, especially if there is a change in a mole's shape or color.  Also tell your health care provider if you have a mole that is larger than the size of a pencil eraser.  Always use sunscreen. Apply sunscreen liberally and repeatedly throughout the day.  Protect yourself by wearing long sleeves, pants, a wide-brimmed hat, and sunglasses whenever you are outside.  Heart disease, diabetes, and high blood pressure  High blood pressure causes heart disease and increases the risk of stroke.  High blood pressure is more likely to develop in: ? People who have blood pressure in the high end of the normal range (130-139/85-89 mm Hg). ? People who are overweight or obese. ? People who are African American.  If you are 27-67 years of age, have your blood pressure checked every 3-5 years. If you are 58 years of age or older, have your blood pressure checked every year. You should have your blood pressure measured twice-once when you are at a hospital or clinic, and once when you are not at a hospital or clinic. Record the average of the two measurements. To check your blood pressure when you are not at a hospital or clinic, you can use: ? An automated blood pressure machine at a pharmacy. ? A home blood pressure monitor.  If you are between 42 years and 65 years old, ask your health care provider if you should take aspirin to prevent strokes.  Have regular diabetes screenings. This involves taking a blood sample to check your fasting blood sugar level. ? If you are at a normal weight and have a low risk for diabetes, have this test once every three years after 48 years of age. ? If you are overweight and have a high risk for diabetes, consider being tested at a younger age or more often. Preventing infection Hepatitis B  If you have a higher risk for hepatitis B, you should be screened for this virus. You are considered at high risk for hepatitis B if: ? You were born in a country where hepatitis B is common. Ask your health care provider which countries are considered high risk. ? Your parents were born in a high-risk country, and you have not been immunized against hepatitis B (hepatitis B vaccine). ? You have HIV or AIDS. ? You use needles to inject street drugs. ? You live with someone who has hepatitis B. ? You have had sex with someone who has hepatitis B. ? You get hemodialysis treatment. ? You take certain medicines for conditions, including cancer, organ transplantation, and  autoimmune conditions.  Hepatitis C  Blood testing is recommended for: ? Everyone born from [birth year] through 1967. ? Anyone with known risk factors for hepatitis C.  Sexually transmitted infections (STIs)  You should be screened for sexually transmitted infections (STIs) including gonorrhea and chlamydia if: ? You are sexually active and are younger than 48 years of age. ? You are older than 48 years of age and your health  care provider tells you that you are at risk for this type of infection. ? Your sexual activity has changed since you were last screened and you are at an increased risk for chlamydia or gonorrhea. Ask your health care provider if you are at risk.  If you do not have HIV, but are at risk, it may be recommended that you take a prescription medicine daily to prevent HIV infection. This is called pre-exposure prophylaxis (PrEP). You are considered at risk if: ? You are sexually active and do not regularly use condoms or know the HIV status of your partner(s). ? You take drugs by injection. ? You are sexually active with a partner who has HIV.  Talk with your health care provider about whether you are at high risk of being infected with HIV. If you choose to begin PrEP, you should first be tested for HIV. You should then be tested every 3 months for as long as you are taking PrEP. Pregnancy  If you are premenopausal and you may become pregnant, ask your health care provider about preconception counseling.  If you may become pregnant, take 400 to 800 micrograms (mcg) of folic acid every day.  If you want to prevent pregnancy, talk to your health care provider about birth control (contraception). Osteoporosis and menopause  Osteoporosis is a disease in which the bones lose minerals and strength with aging. This can result in serious bone fractures. Your risk for osteoporosis can be identified using a bone density scan.  If you are 31 years of age or older, or if you are at  risk for osteoporosis and fractures, ask your health care provider if you should be screened.  Ask your health care provider whether you should take a calcium or vitamin D supplement to lower your risk for osteoporosis.  Menopause may have certain physical symptoms and risks.  Hormone replacement therapy may reduce some of these symptoms and risks. Talk to your health care provider about whether hormone replacement therapy is right for you. Follow these instructions at home:  Schedule regular health, dental, and eye exams.  Stay current with your immunizations.  Do not use any tobacco products including cigarettes, chewing tobacco, or electronic cigarettes.  If you are pregnant, do not drink alcohol.  If you are breastfeeding, limit how much and how often you drink alcohol.  Limit alcohol intake to no more than 1 drink per day for nonpregnant women. One drink equals 12 ounces of beer, 5 ounces of wine, or 1 ounces of hard liquor.  Do not use street drugs.  Do not share needles.  Ask your health care provider for help if you need support or information about quitting drugs.  Tell your health care provider if you often feel depressed.  Tell your health care provider if you have ever been abused or do not feel safe at home. This information is not intended to replace advice given to you by your health care provider. Make sure you discuss any questions you have with your health care provider. Document Released: 01/06/2011 Document Revised: 11/29/2015 Document Reviewed: 03/27/2015 Elsevier Interactive Patient Education  [employer].

## 2017-03-19 NOTE — Assessment & Plan Note (Signed)
Continues to be elevated. We'll start low-dose amlodipine. Discussed with patient at great length that likely as she continues to lose weight, we can stop this medication however we'll continue to monitor.

## 2017-03-20 LAB — CYTOLOGY - PAP
DIAGNOSIS: NEGATIVE
HPV (WINDOPATH): NOT DETECTED

## 2017-03-20 LAB — HIV ANTIBODY (ROUTINE TESTING W REFLEX): HIV 1&2 Ab, 4th Generation: NONREACTIVE

## 2017-03-23 ENCOUNTER — Other Ambulatory Visit: Payer: Self-pay | Admitting: Family

## 2017-03-23 DIAGNOSIS — Z13220 Encounter for screening for lipoid disorders: Secondary | ICD-10-CM

## 2017-03-23 DIAGNOSIS — Z136 Encounter for screening for cardiovascular disorders: Principal | ICD-10-CM

## 2017-04-02 ENCOUNTER — Other Ambulatory Visit (INDEPENDENT_AMBULATORY_CARE_PROVIDER_SITE_OTHER): Payer: BLUE CROSS/BLUE SHIELD

## 2017-04-02 DIAGNOSIS — Z13220 Encounter for screening for lipoid disorders: Secondary | ICD-10-CM | POA: Diagnosis not present

## 2017-04-02 DIAGNOSIS — Z136 Encounter for screening for cardiovascular disorders: Secondary | ICD-10-CM

## 2017-04-02 LAB — LIPID PANEL
CHOLESTEROL: 137 mg/dL (ref 0–200)
HDL: 41.2 mg/dL (ref >39.00)
LDL Cholesterol: 87 mg/dL (ref 0–99)
NonHDL: 95.38
TRIGLYCERIDES: 41 mg/dL (ref 0.0–149.0)
Total CHOL/HDL Ratio: 3
VLDL: 8.2 mg/dL (ref 0.0–40.0)

## 2017-04-30 ENCOUNTER — Other Ambulatory Visit: Payer: Self-pay | Admitting: Family

## 2017-04-30 DIAGNOSIS — E669 Obesity, unspecified: Secondary | ICD-10-CM

## 2017-05-06 ENCOUNTER — Encounter: Payer: Self-pay | Admitting: Family

## 2017-05-08 ENCOUNTER — Telehealth: Payer: Self-pay

## 2017-05-08 DIAGNOSIS — E669 Obesity, unspecified: Secondary | ICD-10-CM

## 2017-05-08 MED ORDER — BUPROPION HCL ER (XL) 150 MG PO TB24: ORAL_TABLET | ORAL | 0 refills | Status: DC

## 2017-05-08 NOTE — Telephone Encounter (Signed)
Copied from [place] [id number]. Topic: Inquiry >> May 08, 2017  8:42 AM Malena Catholic I, NT wrote: Reason for CRM: RX refill Bupropion   Rx Refilled, thanks

## 2017-05-13 ENCOUNTER — Ambulatory Visit
Admission: EM | Admit: 2017-05-13 | Discharge: 2017-05-13 | Disposition: A | Discharge status: Home / Self Care | Payer: BLUE CROSS/BLUE SHIELD | Attending: Family Medicine | Admitting: Family Medicine

## 2017-05-13 ENCOUNTER — Other Ambulatory Visit: Payer: Self-pay

## 2017-05-13 ENCOUNTER — Encounter: Payer: Self-pay | Admitting: Emergency Medicine

## 2017-05-13 DIAGNOSIS — J029 Acute pharyngitis, unspecified: Secondary | ICD-10-CM | POA: Diagnosis not present

## 2017-05-13 DIAGNOSIS — Z23 Encounter for immunization: Secondary | ICD-10-CM

## 2017-05-13 DIAGNOSIS — T24201A Burn of second degree of unspecified site of right lower limb, except ankle and foot, initial encounter: Secondary | ICD-10-CM | POA: Diagnosis not present

## 2017-05-13 DIAGNOSIS — T24231A Burn of second degree of right lower leg, initial encounter: Secondary | ICD-10-CM | POA: Diagnosis not present

## 2017-05-13 DIAGNOSIS — J039 Acute tonsillitis, unspecified: Secondary | ICD-10-CM | POA: Diagnosis not present

## 2017-05-13 LAB — RAPID STREP SCREEN (MED CTR MEBANE ONLY): Streptococcus, Group A Screen (Direct): NEGATIVE

## 2017-05-13 MED ORDER — AMOXICILLIN 875 MG PO TABS: 875.0000 mg | ORAL_TABLET | Freq: Two times a day (BID) | ORAL | 0 refills | Status: DC

## 2017-05-13 MED ORDER — SILVER SULFADIAZINE 1 % EX CREA: 1.0000 "application " | TOPICAL_CREAM | Freq: Two times a day (BID) | CUTANEOUS | 0 refills | Status: AC

## 2017-05-13 MED ORDER — SILVER SULFADIAZINE 1 % EX CREA
TOPICAL_CREAM | Freq: Once | CUTANEOUS | Status: AC
  Administered 2017-05-13: 09:00:00 via TOPICAL

## 2017-05-13 MED ORDER — TETANUS-DIPHTH-ACELL PERTUSSIS 5-2.5-18.5 LF-MCG/0.5 IM SUSP
0.5000 mL | Freq: Once | INTRAMUSCULAR | Status: AC
  Administered 2017-05-13: 0.5 mL via INTRAMUSCULAR

## 2017-05-13 MED ORDER — DEXAMETHASONE SODIUM PHOSPHATE 10 MG/ML IJ SOLN
10.0000 mg | Freq: Once | INTRAMUSCULAR | Status: AC
  Administered 2017-05-13: 10 mg via INTRAMUSCULAR
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
## 2017-05-15 LAB — CULTURE, GROUP A STREP (THRC)

## 2017-05-18 DIAGNOSIS — T24211A Burn of second degree of right thigh, initial encounter: Secondary | ICD-10-CM | POA: Insufficient documentation

## 2017-08-04 ENCOUNTER — Encounter: Payer: Self-pay | Admitting: Family

## 2017-09-30 ENCOUNTER — Telehealth: Payer: Self-pay | Admitting: Family

## 2017-09-30 NOTE — Telephone Encounter (Signed)
Mail letter  Ms [place], Le[place] you are well.   Your mammogram appears due.   Please schedule a follow up appointment with me if you would like for me to order .  Look forward to seeing you.  Catalina Antigua, NP

## 2017-10-06 NOTE — Telephone Encounter (Signed)
Letter sent.

## 2018-03-23 ENCOUNTER — Encounter: Payer: BLUE CROSS/BLUE SHIELD | Admitting: Family

## 2018-03-24 ENCOUNTER — Encounter: Payer: Self-pay | Admitting: Family

## 2018-03-24 ENCOUNTER — Ambulatory Visit (INDEPENDENT_AMBULATORY_CARE_PROVIDER_SITE_OTHER): Payer: 59 | Admitting: Family

## 2018-03-24 VITALS — BP 130/84 | HR 80 | Temp 98.3°F | Resp 16 | Ht 65.75 in | Wt 297.4 lb

## 2018-03-24 DIAGNOSIS — M25561 Pain in right knee: Secondary | ICD-10-CM | POA: Diagnosis not present

## 2018-03-24 DIAGNOSIS — M25562 Pain in left knee: Secondary | ICD-10-CM

## 2018-03-24 DIAGNOSIS — E669 Obesity, unspecified: Secondary | ICD-10-CM | POA: Diagnosis not present

## 2018-03-24 DIAGNOSIS — Z23 Encounter for immunization: Secondary | ICD-10-CM

## 2018-03-24 DIAGNOSIS — G8929 Other chronic pain: Secondary | ICD-10-CM | POA: Insufficient documentation

## 2018-03-24 DIAGNOSIS — R030 Elevated blood-pressure reading, without diagnosis of hypertension: Secondary | ICD-10-CM | POA: Diagnosis not present

## 2018-03-24 DIAGNOSIS — Z0000 Encounter for general adult medical examination without abnormal findings: Secondary | ICD-10-CM | POA: Diagnosis not present

## 2018-03-24 LAB — VITAMIN D 25 HYDROXY (VIT D DEFICIENCY, FRACTURES): VITD: 26.75 ng/mL — AB (ref 30.00–100.00)

## 2018-03-24 LAB — CBC WITH DIFFERENTIAL/PLATELET
BASOS ABS: 0.1 10*3/uL (ref 0.0–0.1)
Basophils Relative: 0.8 % (ref 0.0–3.0)
EOS ABS: 0.1 10*3/uL (ref 0.0–0.7)
Eosinophils Relative: 1.3 % (ref 0.0–5.0)
HCT: 39.1 % (ref 36.0–46.0)
Hemoglobin: 12.7 g/dL (ref 12.0–15.0)
LYMPHS ABS: 2.3 10*3/uL (ref 0.7–4.0)
Lymphocytes Relative: 26.8 % (ref 12.0–46.0)
MCHC: 32.6 g/dL (ref 30.0–36.0)
MCV: 86.4 fl (ref 78.0–100.0)
MONOS PCT: 6.8 % (ref 3.0–12.0)
Monocytes Absolute: 0.6 10*3/uL (ref 0.1–1.0)
NEUTROS ABS: 5.5 10*3/uL (ref 1.4–7.7)
NEUTROS PCT: 64.3 % (ref 43.0–77.0)
PLATELETS: 291 10*3/uL (ref 150.0–400.0)
RBC: 4.52 Mil/uL (ref 3.87–5.11)
RDW: 14.1 % (ref 11.5–15.5)
WBC: 8.6 10*3/uL (ref 4.0–10.5)

## 2018-03-24 LAB — MICROALBUMIN / CREATININE URINE RATIO
Creatinine,U: 54.5 mg/dL
Microalb Creat Ratio: 1.3 mg/g (ref 0.0–30.0)

## 2018-03-24 LAB — COMPREHENSIVE METABOLIC PANEL
ALK PHOS: 77 U/L (ref 39–117)
ALT: 14 U/L (ref 0–35)
AST: 17 U/L (ref 0–37)
Albumin: 4 g/dL (ref 3.5–5.2)
BILIRUBIN TOTAL: 0.4 mg/dL (ref 0.2–1.2)
BUN: 12 mg/dL (ref 6–23)
CO2: 29 meq/L (ref 19–32)
CREATININE: 0.66 mg/dL (ref 0.40–1.20)
Calcium: 9.6 mg/dL (ref 8.4–10.5)
Chloride: 106 mEq/L (ref 96–112)
GFR: 122.25 mL/min (ref >60.00)
GLUCOSE: 98 mg/dL (ref 70–99)
Potassium: 4.3 mEq/L (ref 3.5–5.1)
SODIUM: 140 meq/L (ref 135–145)
TOTAL PROTEIN: 7.3 g/dL (ref 6.0–8.3)

## 2018-03-24 LAB — HEMOGLOBIN A1C: Hgb A1c MFr Bld: 5.9 % (ref 4.6–6.5)

## 2018-03-24 LAB — LIPID PANEL
CHOL/HDL RATIO: 3
Cholesterol: 144 mg/dL (ref 0–200)
HDL: 48 mg/dL (ref >39.00)
LDL Cholesterol: 87 mg/dL (ref 0–99)
NONHDL: 96.18
TRIGLYCERIDES: 46 mg/dL (ref 0.0–149.0)
VLDL: 9.2 mg/dL (ref 0.0–40.0)

## 2018-03-24 LAB — TSH: TSH: 1.06 u[IU]/mL (ref 0.35–4.50)

## 2018-03-24 MED ORDER — BUPROPION HCL ER (XL) 150 MG PO TB24: ORAL_TABLET | ORAL | 1 refills | Status: DC

## 2018-03-24 NOTE — Assessment & Plan Note (Signed)
Chronic, unchanged.  I suspect taking daily meloxicam 15 mg increasing her blood pressure which I explained to patient today.  Advised her that perhaps seeing if injection, laparoscopic etc. would be appropriate next step so she can begin to exercise more readily.  Encourage nonweightbearing exercise, stationary bike, water aerobics.  Referral placed for second opinion with [facility], Dr. Sabra Heck.  Will follow

## 2018-03-24 NOTE — Assessment & Plan Note (Signed)
Diastolic mildly elevated.  Patient keep blood pressure log and we will decide if we increase amlodipine.  Again, suspect 2 mg meloxicam daily and chronic pain from knees contributory.

## 2018-03-24 NOTE — Progress Notes (Signed)
Subjective:    Patient ID: Lorraine Holmes, female    DOB: 1971-01-01, 47 y.o.   MRN: [id number]  CC: Lorraine Holmes is a 47 y.o. female who presents today for physical exam.    HPI: Weight gain- Started wellbutrin for weightloss  Last year however ran out of medication, and gained weight back. New job and less of a commmute and plans to join a gym.  htn- compliant. At home , 130/80. On meloxicam daily.  Denies exertional chest pain or pressure, numbness or tingling radiating to left arm or jaw, palpitations, dizziness, frequent headaches, changes in vision, or shortness of breath.   Knee pain- on mobic without much improvement; following with orthopedics. Desires to loose weight.     Colorectal Cancer Screening: no early family history Breast Cancer Screening: Mammogram due Cervical Cancer Screening: UTD, Bone Health screening/DEXA for 65+: No increased fracture risk. Defer screening at this time. Lung Cancer Screening: Doesn't have 30 year pack year history and age > 97 years. Immunizations       Tetanus - utd        Labs: Screening labs today. Exercise: No regular exercise.  Alcohol use: occasional Smoking/tobacco use: Nonsmoker.  Regular dental exams: UTD Wears seat belt: Yes. Skin: no h/o skin cancer, no new lesions.   HISTORY:  Past Medical History:  Diagnosis Date  . Anemia   . Arthritis    Bilateral knee  . History of chicken pox     Past Surgical History:  Procedure Laterality Date  . FOOT FRACTURE SURGERY Left    2002  . JOINT REPLACEMENT    . KNEE ARTHROSCOPY WITH MEDIAL MENISECTOMY Left 11/28/2014   Procedure: KNEE ARTHROSCOPY WITH MEDIAL MENISECTOMY;  Surgeon: Hessie Knows, MD;  Location: [facility] ORS;  Service: Orthopedics;  Laterality: Left;  Marland Kitchen MEDIAL PARTIAL KNEE REPLACEMENT Right 2013  . meniscus tear Right 2012   Family History  Problem Relation Age of Onset  . Hypertension Mother   . Diabetes Mother   . Hypertension Father   . Heart disease Father 48       CAD - died MI  . Diabetes Maternal Grandmother   . Colon cancer Neg Hx       ALLERGIES: Patient has no known allergies.  Current Outpatient Medications on File Prior to Visit  Medication Sig Dispense Refill  . amLODipine (NORVASC) 2.5 MG tablet Take 1 tablet (2.5 mg total) by mouth daily. 90 tablet 3  . cetirizine (ZYRTEC) 10 MG tablet Take 10 mg by mouth daily.    Marland Kitchen levonorgestrel (MIRENA) 20 MCG/24HR IUD 1 each by Intrauterine route once.    . meloxicam (MOBIC) 15 MG tablet Take 15 mg by mouth daily.     No current facility-administered medications on file prior to visit.     Social History   Tobacco Use  . Smoking status: Never Smoker  . Smokeless tobacco: Never Used  Substance Use Topics  . Alcohol use: Yes    Alcohol/week: 2.0 standard drinks    Types: 2 Standard drinks or equivalent per week    Comment: occ  . Drug use: No    Review of Systems  Constitutional: Negative for chills, fever and unexpected weight change.  HENT: Negative for congestion.   Respiratory: Negative for cough.   Cardiovascular: Negative for chest pain, palpitations and leg swelling.  Gastrointestinal: Negative for nausea and vomiting.  Genitourinary: Negative for dyspareunia.  Musculoskeletal: Positive for arthralgias (chronic, knees). Negative for myalgias.  Skin:  Negative for rash.  Neurological: Negative for headaches.  Hematological: Negative for adenopathy.  Psychiatric/Behavioral: Negative for confusion.      Objective:    BP 130/84   Pulse 80   Temp 98.3 F (36.8 C) (Oral)   Resp 16   Ht 5' 5.75" (1.67 m)   Wt 297 lb 6 oz (134.9 kg)   SpO2 99%   BMI 48.36 kg/m   BP Readings from Last 3 Encounters:  03/24/18 130/84  05/13/17 (!) 146/75  03/19/17 (!) 142/86   Wt Readings from Last 3 Encounters:  03/24/18 297 lb 6 oz (134.9 kg)  05/13/17 279 lb (126.6 kg)  03/19/17 288 lb 3.2 oz (130.7 kg)    Physical Exam  Constitutional: She appears well-developed and  well-nourished.  Eyes: Conjunctivae are normal.  Neck: No thyroid mass and no thyromegaly present.  Cardiovascular: Normal rate, regular rhythm, normal heart sounds and normal pulses.  Pulmonary/Chest: Effort normal and breath sounds normal. She has no wheezes. She has no rhonchi. She has no rales. Right breast exhibits no inverted nipple, no mass, no nipple discharge, no skin change and no tenderness. Left breast exhibits no inverted nipple, no mass, no nipple discharge, no skin change and no tenderness. Breasts are symmetrical.  CBE performed.   Lymphadenopathy:       Head (right side): No submental, no submandibular, no tonsillar, no preauricular, no posterior auricular and no occipital adenopathy present.       Head (left side): No submental, no submandibular, no tonsillar, no preauricular, no posterior auricular and no occipital adenopathy present.    She has no cervical adenopathy.       Right cervical: No superficial cervical, no deep cervical and no posterior cervical adenopathy present.      Left cervical: No superficial cervical, no deep cervical and no posterior cervical adenopathy present.    She has no axillary adenopathy.  Neurological: She is alert.  Skin: Skin is warm and dry.  Psychiatric: She has a normal mood and affect. Her speech is normal and behavior is normal. Thought content normal.  Vitals reviewed.      Assessment & Plan:   Problem List Items Addressed This Visit      Other   Routine general medical examination at a health care facility - Primary    CBE performed.  Due for mammogram and patient will schedule.  Pelvic exam deferred in the absence of complaints, Pap up-to-date.  Referral to dermatology for annual skin exam.      Relevant Orders   MM 3D SCREEN BREAST BILATERAL   Ambulatory referral to Dermatology   CBC with Differential/Platelet   Comprehensive metabolic panel   Hemoglobin A1c   Lipid panel   TSH   VITAMIN D 25 Hydroxy (Vit-D Deficiency,  Fractures)   Microalbumin / creatinine urine ratio   Obesity    She is off Wellbutrin for period time, experienced weight gain.  We will restart Wellbutrin.  Referral to Redgie Grayer weight loss physician.      Relevant Medications   buPROPion (WELLBUTRIN XL) 150 MG 24 hr tablet   Other Relevant Orders   Ambulatory referral to Orthopedic Surgery   Elevated blood pressure reading    Diastolic mildly elevated.  Patient keep blood pressure log and we will decide if we increase amlodipine.  Again, suspect 2 mg meloxicam daily and chronic pain from knees contributory.      Relevant Orders   Microalbumin / creatinine urine ratio   Chronic  pain of both knees    Chronic, unchanged.  I suspect taking daily meloxicam 15 mg increasing her blood pressure which I explained to patient today.  Advised her that perhaps seeing if injection, laparoscopic etc. would be appropriate next step so she can begin to exercise more readily.  Encourage nonweightbearing exercise, stationary bike, water aerobics.  Referral placed for second opinion with [facility], Dr. Sabra Heck.  Will follow      Relevant Medications   buPROPion (WELLBUTRIN XL) 150 MG 24 hr tablet   meloxicam (MOBIC) 15 MG tablet   Other Relevant Orders   Ambulatory referral to Orthopedic Surgery       I have discontinued Deaven D. Vazques's amoxicillin. I am also having her maintain her cetirizine, levonorgestrel, amLODipine, buPROPion, and meloxicam.   Meds ordered this encounter  Medications  . buPROPion (WELLBUTRIN XL) 150 MG 24 hr tablet    Sig: TAKE 1 TABLET BY MOUTH EVERY MORNING FOR 7 DAYS, THEN INCREASE TO 2 TABS DAILY    Dispense:  120 tablet    Refill:  1    Order Specific Question:   Supervising Provider    Answer:   Crecencio Mc [[id number]]    Return precautions given.   Risks, benefits, and alternatives of the medications and treatment plan prescribed today were discussed, and patient expressed understanding.   Education  regarding symptom management and diagnosis given to patient on AVS.   Continue to follow with Burnard Hawthorne, FNP for routine health maintenance.   Lakishia D Manalo and I agreed with plan.   Mable Paris, FNP

## 2018-03-24 NOTE — Patient Instructions (Addendum)
Restart wellbutrin and titrate up to 349m dose total in the morning  Monitor blood pressure,  Goal is less than 130/80; if persistently higher, please make sooner follow up appointment so we can recheck you blood pressure and manage medications. We may need to increase amlodipine. Think meloxicam is likely increasing your blood pressure.   Today we discussed referrals, orders. [facility] Grayer[facility] Weight Physican, orthopedics, dermatology    I have placed these orders in the system for you.  Please be sure to give [place] call if you have not heard from our office regarding scheduling a test or regarding referral in a timely manner.  It is very important that you let me know as soon as possible.   We placed a referral for mammogram this year. I asked that you call one the below locations and schedule this when it is convenient for you.   As discussed, I would like you to ask for 3D mammogram over the traditional 2D mammogram as new evidence suggest 3D is superior.   Please note that NOT all insurance companies cover 3D and you may have to pay a higher copay. You may call your insurance company to further clarify your benefits.   [place] * Offers 3D mammogram if you ask*[facility]* Note if you ask for 3D mammogram at this location, you must request [place]*     Health Maintenance, Female Adopting a healthy lifestyle and getting preventive care can go a long way to promote health and wellness. Talk with your health care provider about what schedule of regular examinations is right for you. This is a good chance for you to check in with your provider about disease prevention and staying healthy. In between checkups, there are plenty of things you can do on your own. Experts have done a lot of research  about which lifestyle changes and preventive measures are most likely to keep you healthy. Ask your health care provider for more information. Weight and diet Eat a healthy diet  Be sure to include plenty of vegetables, fruits, low-fat dairy products, and lean protein.  Do not eat a lot of foods high in solid fats, added sugars, or salt.  Get regular exercise. This is one of the most important things you can do for your health. ? Most adults should exercise for at least 150 minutes each week. The exercise should increase your heart rate and make you sweat (moderate-intensity exercise). ? Most adults should also do strengthening exercises at least twice a week. This is in addition to the moderate-intensity exercise.  Maintain a healthy weight  Body mass index (BMI) is a measurement that can be used to identify possible weight problems. It estimates body fat based on height and weight. Your health care provider can help determine your BMI and help you achieve or maintain a healthy weight.  For females 244years of age and older: ? A BMI below 18.5 is considered underweight. ? A BMI of 18.5 to 24.9 is normal. ? A BMI of 25 to 29.9 is considered overweight. ? A BMI of 30 and above is considered obese.  Watch levels of cholesterol and blood lipids  You should start having your blood tested for lipids and cholesterol at 49years of age, then have this test every 5 years.  You may need to have your cholesterol levels checked more often if: ? Your lipid or cholesterol levels are high. ? You are older than 49 years of age. ? You are at high risk for heart disease.  Cancer screening Lung Cancer  Lung cancer screening is recommended for adults 29-20 years old who are at high risk for lung cancer because of a history of smoking.  A yearly low-dose CT scan of the lungs is recommended for people who: ? Currently smoke. ? Have quit within the past 15 years. ? Have at least a 30-pack-year  history of smoking. A pack year is smoking an average of one pack of cigarettes a day for 1 year.  Yearly screening should continue until it has been 15 years since you quit.  Yearly screening should stop if you develop a health problem that would prevent you from having lung cancer treatment.  Breast Cancer  Practice breast self-awareness. This means understanding how your breasts normally appear and feel.  It also means doing regular breast self-exams. Let your health care provider know about any changes, no matter how small.  If you are in your 20s or 30s, you should have a clinical breast exam (CBE) by a health care provider every 1-3 years as part of a regular health exam.  If you are 60 or older, have a CBE every year. Also consider having a breast X-ray (mammogram) every year.  If you have a family history of breast cancer, talk to your health care provider about genetic screening.  If you are at high risk for breast cancer, talk to your health care provider about having an MRI and a mammogram every year.  Breast cancer gene (BRCA) assessment is recommended for women who have family members with BRCA-related cancers. BRCA-related cancers include: ? Breast. ? Ovarian. ? Tubal. ? Peritoneal cancers.  Results of the assessment will determine the need for genetic counseling and BRCA1 and BRCA2 testing.  Cervical Cancer Your health care provider may recommend that you be screened regularly for cancer of the pelvic organs (ovaries, uterus, and vagina). This screening involves a pelvic examination, including checking for microscopic changes to the surface of your cervix (Pap test). You may be encouraged to have this screening done every 3 years, beginning at age 74.  For women ages 71-65, health care providers may recommend pelvic exams and Pap testing every 3 years, or they may recommend the Pap and pelvic exam, combined with testing for human papilloma virus (HPV), every 5 years.  Some types of HPV increase your risk of cervical cancer. Testing for HPV may also be done on women of any age with unclear Pap test results.  Other health care providers may not recommend any screening for nonpregnant women who are considered low risk for pelvic cancer and who do not have symptoms. Ask your health care provider if a screening pelvic exam is right for you.  If you have had past treatment for cervical cancer or a condition that could lead to cancer, you need Pap tests and screening for cancer for at least 20 years after your treatment. If Pap tests have been discontinued, your risk factors (such as having a new sexual partner) need to be reassessed to determine if screening should resume. Some women have medical problems that increase the chance of getting cervical cancer. In these cases, your health care provider may recommend more frequent screening and Pap tests.  Colorectal Cancer  This type of cancer can be detected  and often prevented.  Routine colorectal cancer screening usually begins at 49 years of age and continues through 49 years of age.  Your health care provider may recommend screening at an earlier age if you have risk factors for colon cancer.  Your health care provider may also recommend using home test kits to check for hidden blood in the stool.  A small camera at the end of a tube can be used to examine your colon directly (sigmoidoscopy or colonoscopy). This is done to check for the earliest forms of colorectal cancer.  Routine screening usually begins at age 15.  Direct examination of the colon should be repeated every 5-10 years through 49 years of age. However, you may need to be screened more often if early forms of precancerous polyps or small growths are found.  Skin Cancer  Check your skin from head to toe regularly.  Tell your health care provider about any new moles or changes in moles, especially if there is a change in a mole's shape or  color.  Also tell your health care provider if you have a mole that is larger than the size of a pencil eraser.  Always use sunscreen. Apply sunscreen liberally and repeatedly throughout the day.  Protect yourself by wearing long sleeves, pants, a wide-brimmed hat, and sunglasses whenever you are outside.  Heart disease, diabetes, and high blood pressure  High blood pressure causes heart disease and increases the risk of stroke. High blood pressure is more likely to develop in: ? People who have blood pressure in the high end of the normal range (130-139/85-89 mm Hg). ? People who are overweight or obese. ? People who are African American.  If you are 49-66 years of age, have your blood pressure checked every 3-5 years. If you are 74 years of age or older, have your blood pressure checked every year. You should have your blood pressure measured twice-once when you are at a hospital or clinic, and once when you are not at a hospital or clinic. Record the average of the two measurements. To check your blood pressure when you are not at a hospital or clinic, you can use: ? An automated blood pressure machine at a pharmacy. ? A home blood pressure monitor.  If you are between 11 years and 32 years old, ask your health care provider if you should take aspirin to prevent strokes.  Have regular diabetes screenings. This involves taking a blood sample to check your fasting blood sugar level. ? If you are at a normal weight and have a low risk for diabetes, have this test once every three years after 49 years of age. ? If you are overweight and have a high risk for diabetes, consider being tested at a younger age or more often. Preventing infection Hepatitis B  If you have a higher risk for hepatitis B, you should be screened for this virus. You are considered at high risk for hepatitis B if: ? You were born in a country where hepatitis B is common. Ask your health care provider which countries  are considered high risk. ? Your parents were born in a high-risk country, and you have not been immunized against hepatitis B (hepatitis B vaccine). ? You have HIV or AIDS. ? You use needles to inject street drugs. ? You live with someone who has hepatitis B. ? You have had sex with someone who has hepatitis B. ? You get hemodialysis treatment. ? You take certain medicines for  conditions, including cancer, organ transplantation, and autoimmune conditions.  Hepatitis C  Blood testing is recommended for: ? Everyone born from [birth year] through 1967. ? Anyone with known risk factors for hepatitis C.  Sexually transmitted infections (STIs)  You should be screened for sexually transmitted infections (STIs) including gonorrhea and chlamydia if: ? You are sexually active and are younger than 49 years of age. ? You are older than 49 years of age and your health care provider tells you that you are at risk for this type of infection. ? Your sexual activity has changed since you were last screened and you are at an increased risk for chlamydia or gonorrhea. Ask your health care provider if you are at risk.  If you do not have HIV, but are at risk, it may be recommended that you take a prescription medicine daily to prevent HIV infection. This is called pre-exposure prophylaxis (PrEP). You are considered at risk if: ? You are sexually active and do not regularly use condoms or know the HIV status of your partner(s). ? You take drugs by injection. ? You are sexually active with a partner who has HIV.  Talk with your health care provider about whether you are at high risk of being infected with HIV. If you choose to begin PrEP, you should first be tested for HIV. You should then be tested every 3 months for as long as you are taking PrEP. Pregnancy  If you are premenopausal and you may become pregnant, ask your health care provider about preconception counseling.  If you may become pregnant, take 400  to 800 micrograms (mcg) of folic acid every day.  If you want to prevent pregnancy, talk to your health care provider about birth control (contraception). Osteoporosis and menopause  Osteoporosis is a disease in which the bones lose minerals and strength with aging. This can result in serious bone fractures. Your risk for osteoporosis can be identified using a bone density scan.  If you are 33 years of age or older, or if you are at risk for osteoporosis and fractures, ask your health care provider if you should be screened.  Ask your health care provider whether you should take a calcium or vitamin D supplement to lower your risk for osteoporosis.  Menopause may have certain physical symptoms and risks.  Hormone replacement therapy may reduce some of these symptoms and risks. Talk to your health care provider about whether hormone replacement therapy is right for you. Follow these instructions at home:  Schedule regular health, dental, and eye exams.  Stay current with your immunizations.  Do not use any tobacco products including cigarettes, chewing tobacco, or electronic cigarettes.  If you are pregnant, do not drink alcohol.  If you are breastfeeding, limit how much and how often you drink alcohol.  Limit alcohol intake to no more than 1 drink per day for nonpregnant women. One drink equals 12 ounces of beer, 5 ounces of wine, or 1 ounces of hard liquor.  Do not use street drugs.  Do not share needles.  Ask your health care provider for help if you need support or information about quitting drugs.  Tell your health care provider if you often feel depressed.  Tell your health care provider if you have ever been abused or do not feel safe at home. This information is not intended to replace advice given to you by your health care provider. Make sure you discuss any questions you have with your health care provider.  Document Released: 01/06/2011 Document Revised: 11/29/2015  Document Reviewed: 03/27/2015 Elsevier Interactive Patient Education  [employer].

## 2018-03-24 NOTE — Assessment & Plan Note (Signed)
She is off Wellbutrin for period time, experienced weight gain.  We will restart Wellbutrin.  Referral to Redgie Grayer weight loss physician.

## 2018-03-24 NOTE — Assessment & Plan Note (Addendum)
CBE performed.  Due for mammogram and patient will schedule.  Pelvic exam deferred in the absence of complaints, Pap up-to-date.  Referral to dermatology for annual skin exam.

## 2018-04-07 ENCOUNTER — Other Ambulatory Visit: Payer: Self-pay | Admitting: Family

## 2018-04-07 DIAGNOSIS — R030 Elevated blood-pressure reading, without diagnosis of hypertension: Secondary | ICD-10-CM

## 2018-04-12 ENCOUNTER — Other Ambulatory Visit: Payer: Self-pay

## 2018-04-12 ENCOUNTER — Ambulatory Visit
Admission: EM | Admit: 2018-04-12 | Discharge: 2018-04-12 | Disposition: A | Discharge status: Home / Self Care | Payer: 59 | Attending: Family Medicine | Admitting: Family Medicine

## 2018-04-12 ENCOUNTER — Encounter: Payer: Self-pay | Admitting: Emergency Medicine

## 2018-04-12 DIAGNOSIS — R300 Dysuria: Secondary | ICD-10-CM

## 2018-04-12 LAB — URINALYSIS, COMPLETE (UACMP) WITH MICROSCOPIC
BILIRUBIN URINE: NEGATIVE
Glucose, UA: NEGATIVE mg/dL
KETONES UR: NEGATIVE mg/dL
LEUKOCYTES UA: NEGATIVE
NITRITE: NEGATIVE
PH: 6.5 (ref 5.0–8.0)
PROTEIN: NEGATIVE mg/dL
Specific Gravity, Urine: 1.01 (ref 1.005–1.030)

## 2018-04-12 MED ORDER — CEPHALEXIN 500 MG PO CAPS: 500.0000 mg | ORAL_CAPSULE | Freq: Two times a day (BID) | ORAL | 0 refills | Status: AC

## 2018-04-12 NOTE — ED Triage Notes (Signed)
Patient c/o urinary frequency and low back pain that started 3 days ago.

## 2018-04-12 NOTE — ED Provider Notes (Signed)
[facility] ____________________________________________  Time seen: Approximately 9:42 AM  I have reviewed the triage vital signs and the nursing notes.   HISTORY  Chief Complaint Urinary Frequency and Back Pain   HPI Lorraine Holmes is a 47 y.o. female presenting for evaluation of urinary frequency and urgency with accompanying low back pain that started 3 days ago.  States that she feels like when she urinates she has to go right back to the bathroom to pee again and not always in large quantities.  Also low bilateral back pain that is described as an aching sometimes punching pain.  Does sometimes worsen with movement but not always.  Improves with rest.  Denies any anterior abdominal pain, burning with urination, vaginal discharge, vaginal discomfort.  Reports not sexually active at this time.  No known fevers.  Continues to eat and drink well.  No bowel changes.  Reports otherwise doing well.  Denies recent antibiotic use.  Burnard Hawthorne, FNP: PCP  Past Medical History:  Diagnosis Date  . Anemia   . Arthritis    Bilateral knee  . History of chicken pox     Patient Active Problem List   Diagnosis Date Noted  . Chronic pain of both knees 03/24/2018  . Obesity 02/26/2017  . Elevated blood pressure reading 02/26/2017  . Routine general medical examination at a health care facility 12/21/2013    Past Surgical History:  Procedure Laterality Date  . FOOT FRACTURE SURGERY Left    2002  . JOINT REPLACEMENT    . KNEE ARTHROSCOPY WITH MEDIAL MENISECTOMY Left 11/28/2014   Procedure: KNEE ARTHROSCOPY WITH MEDIAL MENISECTOMY;  Surgeon: Hessie Knows, MD;  Location: [facility] ORS;  Service: Orthopedics;  Laterality: Left;  Marland Kitchen MEDIAL PARTIAL KNEE REPLACEMENT Right 2013  . meniscus tear Right 2012     No current facility-administered medications for this encounter.   Current Outpatient Medications:  .  amLODipine (NORVASC) 2.5 MG tablet, TAKE 1 TABLET BY MOUTH EVERY DAY,  Disp: 90 tablet, Rfl: 1 .  buPROPion (WELLBUTRIN XL) 150 MG 24 hr tablet, TAKE 1 TABLET BY MOUTH EVERY MORNING FOR 7 DAYS, THEN INCREASE TO 2 TABS DAILY, Disp: 120 tablet, Rfl: 1 .  cetirizine (ZYRTEC) 10 MG tablet, Take 10 mg by mouth daily., Disp: , Rfl:  .  levonorgestrel (MIRENA) 20 MCG/24HR IUD, 1 each by Intrauterine route once., Disp: , Rfl:  .  meloxicam (MOBIC) 15 MG tablet, Take 15 mg by mouth daily., Disp: , Rfl:  .  cephALEXin (KEFLEX) 500 MG capsule, Take 1 capsule (500 mg total) by mouth 2 (two) times daily for 7 days., Disp: 14 capsule, Rfl: 0  Allergies Patient has no known allergies.  Family History  Problem Relation Age of Onset  . Hypertension Mother   . Diabetes Mother   . Hypertension Father   . Heart disease Father 79       CAD - died MI  . Diabetes Maternal Grandmother   . Colon cancer Neg Hx     Social History Social History   Tobacco Use  . Smoking status: Never Smoker  . Smokeless tobacco: Never Used  Substance Use Topics  . Alcohol use: Yes    Alcohol/week: 2.0 standard drinks    Types: 2 Standard drinks or equivalent per week    Comment: occ  . Drug use: No    Review of Systems Constitutional: No fever Cardiovascular: Denies chest pain. Respiratory: Denies shortness of breath. Gastrointestinal: No abdominal pain.  No  nausea, no vomiting.  No diarrhea.  Genitourinary: positive for dysuria. Musculoskeletal: positive for back pain. Skin: Negative for rash.  ____________________________________________   PHYSICAL EXAM:  VITAL SIGNS: ED Triage Vitals  Enc Vitals Group     BP 04/12/18 0857 (!) 156/91     Pulse Rate 04/12/18 0857 86     Resp 04/12/18 0857 18     Temp 04/12/18 0857 98.5 F (36.9 C)     Temp Source 04/12/18 0857 Oral     SpO2 04/12/18 0857 100 %     Weight 04/12/18 0855 290 lb (131.5 kg)     Height 04/12/18 0855 5\' 5"  (1.651 m)     Head Circumference --      Peak Flow --      Pain Score 04/12/18 0855 8     Pain Loc --       Pain Edu? --      Excl. in [place]? --     Constitutional: Alert and oriented. Well appearing and in no acute distress. ENT      Head: Normocephalic and atraumatic. Cardiovascular: Normal rate, regular rhythm. Grossly normal heart sounds.  Good peripheral circulation. Respiratory: Normal respiratory effort without tachypnea nor retractions. Breath sounds are clear and equal bilaterally. No wheezes, rales, rhonchi. Gastrointestinal: Soft and nontender. Obese abdomen. Musculoskeletal:  No midline cervical, thoracic or lumbar tenderness to palpation.  Bilateral lower CVA and latissimus dorsi tenderness to palpation, no pain with movement this time.  No midline tenderness. neurologic:  Normal speech and language. Speech is normal. No gait instability.  Skin:  Skin is warm, dry and intact. No rash noted. Psychiatric: Mood and affect are normal. Speech and behavior are normal. Patient exhibits appropriate insight and judgment   ___________________________________________   LABS (all labs ordered are listed, but only abnormal results are displayed)  Labs Reviewed  URINALYSIS, COMPLETE (UACMP) WITH MICROSCOPIC - Abnormal; Notable for the following components:      Result Value   Hgb urine dipstick SMALL (*)    Bacteria, UA FEW (*)    All other components within normal limits  URINE CULTURE     PROCEDURES Procedures    INITIAL IMPRESSION / ASSESSMENT AND PLAN / ED COURSE  Pertinent labs & imaging results that were available during my care of the patient were reviewed by me and considered in my medical decision making (see chart for details).  Well-appearing patient.  No acute distress.  Urinary frequency and urgency.  Patient denies any concerns of STDs, pregnancy or irritant factors.  Suspect UTI.  We will culture urine.  Will treat with oral Keflex.  Encourage rest, fluids, supportive care.Discussed indication, risks and benefits of medications with patient.  Discussed follow up  with Primary care physician this week. Discussed follow up and return parameters including no resolution or any worsening concerns. Patient verbalized understanding and agreed to plan.   ____________________________________________   FINAL CLINICAL IMPRESSION(S) / ED DIAGNOSES  Final diagnoses:  Dysuria     ED Discharge Orders         Ordered    cephALEXin (KEFLEX) 500 MG capsule  2 times daily     04/12/18 7412           Note: This dictation was prepared with Dragon dictation along with smaller phrase technology. Any transcriptional errors that result from this process are unintentional.         Marylene Land, NP 04/12/18 [phone]

## 2018-04-12 NOTE — Discharge Instructions (Addendum)
Take medication as prescribed. Rest. Drink plenty of fluids.  ° °Follow up with your primary care physician this week as needed. Return to Urgent care for new or worsening concerns.  ° °

## 2018-04-13 LAB — URINE CULTURE: Culture: NO GROWTH

## 2018-04-15 ENCOUNTER — Other Ambulatory Visit: Payer: Self-pay | Admitting: Family

## 2018-04-15 DIAGNOSIS — E669 Obesity, unspecified: Secondary | ICD-10-CM

## 2018-04-19 ENCOUNTER — Other Ambulatory Visit: Payer: Self-pay | Admitting: Orthopedic Surgery

## 2018-04-19 MED ORDER — BUPROPION HCL ER (XL) 300 MG PO TB24: 300.0000 mg | ORAL_TABLET | Freq: Every morning | ORAL | 2 refills | Status: DC

## 2018-04-19 NOTE — Telephone Encounter (Signed)
Call pt Let her know I refilled wellbutrin at 300mg  qam. She had been on 150mg  tablets and taking two tablets .   This should be easier for her.

## 2018-04-29 ENCOUNTER — Encounter: Payer: Self-pay | Admitting: *Deleted

## 2018-04-29 NOTE — Telephone Encounter (Signed)
Letter mailed

## 2018-05-11 ENCOUNTER — Encounter: Payer: Self-pay | Admitting: Family

## 2018-07-13 ENCOUNTER — Inpatient Hospital Stay: Admission: RE | Admit: 2018-07-13 | Payer: 59 | Source: Ambulatory Visit

## 2018-08-12 ENCOUNTER — Ambulatory Visit
Admission: RE | Admit: 2018-08-12 | Discharge: 2018-08-12 | Disposition: A | Discharge status: Home / Self Care | Payer: 59 | Source: Ambulatory Visit | Attending: Family | Admitting: Family

## 2018-08-12 ENCOUNTER — Other Ambulatory Visit: Payer: Self-pay | Admitting: Family

## 2018-08-12 ENCOUNTER — Encounter (INDEPENDENT_AMBULATORY_CARE_PROVIDER_SITE_OTHER): Payer: Self-pay

## 2018-08-12 DIAGNOSIS — Z0000 Encounter for general adult medical examination without abnormal findings: Secondary | ICD-10-CM | POA: Diagnosis not present

## 2018-08-12 DIAGNOSIS — R928 Other abnormal and inconclusive findings on diagnostic imaging of breast: Secondary | ICD-10-CM

## 2018-08-12 DIAGNOSIS — N6310 Unspecified lump in the right breast, unspecified quadrant: Secondary | ICD-10-CM

## 2018-08-26 ENCOUNTER — Ambulatory Visit
Admission: RE | Admit: 2018-08-26 | Discharge: 2018-08-26 | Disposition: A | Discharge status: Home / Self Care | Payer: 59 | Source: Ambulatory Visit | Attending: Family | Admitting: Family

## 2018-08-26 DIAGNOSIS — R928 Other abnormal and inconclusive findings on diagnostic imaging of breast: Secondary | ICD-10-CM

## 2018-08-26 DIAGNOSIS — N6310 Unspecified lump in the right breast, unspecified quadrant: Secondary | ICD-10-CM | POA: Diagnosis present

## 2018-09-20 ENCOUNTER — Encounter: Payer: Self-pay | Admitting: Family

## 2018-11-12 ENCOUNTER — Other Ambulatory Visit: Payer: Self-pay | Admitting: Family

## 2018-11-12 DIAGNOSIS — R030 Elevated blood-pressure reading, without diagnosis of hypertension: Secondary | ICD-10-CM

## 2019-01-14 ENCOUNTER — Other Ambulatory Visit: Payer: Self-pay

## 2019-01-14 ENCOUNTER — Encounter: Payer: Self-pay | Admitting: Obstetrics and Gynecology

## 2019-01-14 ENCOUNTER — Ambulatory Visit (INDEPENDENT_AMBULATORY_CARE_PROVIDER_SITE_OTHER): Payer: 59 | Admitting: Obstetrics and Gynecology

## 2019-01-14 VITALS — BP 150/79 | HR 96 | Ht 65.0 in | Wt 310.3 lb

## 2019-01-14 DIAGNOSIS — Z30433 Encounter for removal and reinsertion of intrauterine contraceptive device: Secondary | ICD-10-CM

## 2019-01-14 NOTE — Progress Notes (Signed)
Lorraine Holmes is a 48 y.o. year old No obstetric history on file. African American female who presents for removal and replacement of a Mirena IUD. She was given informed consent for removal and reinsertion of her Mirena. Her Mirena was placed 2015, No LMP recorded (lmp unknown). (Menstrual status: IUD)., and her pregnancy test today was negative.   The risks and benefits of the method and placement have been thouroughly reviewed with the patient and all questions were answered.  Specifically the patient is aware of failure rate of 07/998, expulsion of the IUD and of possible perforation.  The patient is aware of irregular bleeding due to the method and understands the incidence of irregular bleeding diminishes with time.  Signed copy of informed consent in chart.   No LMP recorded (lmp unknown). (Menstrual status: IUD). BP (!) 150/79   Pulse 96   Ht 5\' 5"  (1.651 m)   Wt (!) 310 lb 4.8 oz (140.8 kg)   LMP  (LMP Unknown)   BMI 51.64 kg/m  No results found for this or any previous visit (from the past 24 hour(s)).   Appropriate time out taken. A graves speculum was placed in the vagina.  The cervix was visualized, prepped using Betadine. The strings were visible. They were grasped and the Mirena was easily removed. The cervix was then grasped with a single-tooth tenaculum. The uterus was found to be anteroflexed and it sounded to 9 cm.  Mirena IUD placed per manufacturer's recommendations without complications. The strings were trimmed to 3 cm.  The patient tolerated the procedure well.   The patient was given post procedure instructions, including signs and symptoms of infection and to check for the strings after each menses or each month, and refraining from intercourse or anything in the vagina for 3 days.  She was given a Mirena care card with date Mirena placed, and date Mirena to be removed.    Laquon Emel Rockney Ghee, CNM

## 2019-01-14 NOTE — Patient Instructions (Signed)

## 2019-02-23 ENCOUNTER — Ambulatory Visit (INDEPENDENT_AMBULATORY_CARE_PROVIDER_SITE_OTHER): Payer: 59 | Admitting: Obstetrics and Gynecology

## 2019-02-23 ENCOUNTER — Encounter: Payer: Self-pay | Admitting: Obstetrics and Gynecology

## 2019-02-23 ENCOUNTER — Other Ambulatory Visit (HOSPITAL_COMMUNITY)
Admission: RE | Admit: 2019-02-23 | Discharge: 2019-02-23 | Disposition: A | Discharge status: Home / Self Care | Payer: 59 | Source: Ambulatory Visit | Attending: Obstetrics and Gynecology | Admitting: Obstetrics and Gynecology

## 2019-02-23 ENCOUNTER — Other Ambulatory Visit: Payer: Self-pay

## 2019-02-23 VITALS — BP 140/76 | HR 76 | Ht 65.0 in | Wt 303.8 lb

## 2019-02-23 DIAGNOSIS — Z124 Encounter for screening for malignant neoplasm of cervix: Secondary | ICD-10-CM | POA: Diagnosis not present

## 2019-02-23 DIAGNOSIS — Z01419 Encounter for gynecological examination (general) (routine) without abnormal findings: Secondary | ICD-10-CM | POA: Diagnosis not present

## 2019-02-23 DIAGNOSIS — Z30431 Encounter for routine checking of intrauterine contraceptive device: Secondary | ICD-10-CM | POA: Diagnosis not present

## 2019-02-23 NOTE — Progress Notes (Signed)
Subjective:   Lorraine Holmes is a 48 y.o. No obstetric history on file. African American female here for a routine well-woman exam.  No LMP recorded. (Menstrual status: IUD).    Current complaints: none PCP: Arnette       PCP does labs  Social History: Sexual: heterosexual Marital Status: single Living situation: with family Occupation: Surveyor, quantity at [employer] Tobacco/alcohol: no tobacco use Illicit drugs: no history of illicit drug use  The following portions of the patient's history were reviewed and updated as appropriate: allergies, current medications, past family history, past medical history, past social history, past surgical history and problem list.  Past Medical History Past Medical History:  Diagnosis Date  . Anemia   . Arthritis    Bilateral knee  . History of chicken pox     Past Surgical History Past Surgical History:  Procedure Laterality Date  . FOOT FRACTURE SURGERY Left    2002  . JOINT REPLACEMENT    . KNEE ARTHROSCOPY WITH MEDIAL MENISECTOMY Left 11/28/2014   Procedure: KNEE ARTHROSCOPY WITH MEDIAL MENISECTOMY;  Surgeon: Hessie Knows, MD;  Location: [facility] ORS;  Service: Orthopedics;  Laterality: Left;  Marland Kitchen MEDIAL PARTIAL KNEE REPLACEMENT Right 2013  . meniscus tear Right 2012    Gynecologic History No obstetric history on file.  No LMP recorded. (Menstrual status: IUD). Contraception: IUD Last Pap: 2015. Results were: normal Last mammogram: 08/2018. Results were: normal   Obstetric History OB History  No obstetric history on file.    Current Medications Current Outpatient Medications on File Prior to Visit  Medication Sig Dispense Refill  . amLODipine (NORVASC) 2.5 MG tablet TAKE 1 TABLET BY MOUTH EVERY DAY 30 tablet 5  . buPROPion (WELLBUTRIN XL) 300 MG 24 hr tablet Take 1 tablet (300 mg total) by mouth every morning. 90 tablet 2  . cetirizine (ZYRTEC) 10 MG tablet Take 10 mg by mouth daily.    . diclofenac (VOLTAREN) 75 MG EC  tablet     . levonorgestrel (MIRENA) 20 MCG/24HR IUD 1 each by Intrauterine route once.     No current facility-administered medications on file prior to visit.     Review of Systems Patient denies any headaches, blurred vision, shortness of breath, chest pain, abdominal pain, problems with bowel movements, urination, or intercourse.  Objective:  BP 140/76   Pulse 76   Ht 5\' 5"  (1.651 m)   Wt (!) 303 lb 12.8 oz (137.8 kg)   BMI 50.55 kg/m  Physical Exam  General:  Well developed, well nourished, no acute distress. She is alert and oriented x3. Skin:  Warm and dry Neck:  Midline trachea, no thyromegaly or nodules Cardiovascular: Regular rate and rhythm, no murmur heard Lungs:  Effort normal, all lung fields clear to auscultation bilaterally Breasts:  No dominant palpable mass, retraction, or nipple discharge Abdomen:  Soft, non tender, no hepatosplenomegaly or masses Pelvic:  External genitalia is normal in appearance.  The vagina is normal in appearance. The cervix is bulbous, no CMT.  Thin prep pap is done with HR HPV cotesting. Uterus is felt to be normal size, shape, and contour. IUD string noted No adnexal masses or tenderness noted. Extremities:  No swelling or varicosities noted Psych:  She has a normal mood and affect  Assessment:   Healthy well-woman exam IUD check BMI 50  Plan:   F/U 1 year for AE, or sooner if needed   Zadyn Yardley Rockney Ghee, CNM

## 2019-02-25 LAB — CYTOLOGY - PAP
Adequacy: ABSENT
Chlamydia: NEGATIVE
Diagnosis: NEGATIVE
Neisseria Gonorrhea: NEGATIVE

## 2019-03-28 ENCOUNTER — Encounter: Payer: 59 | Admitting: Family

## 2019-06-23 ENCOUNTER — Other Ambulatory Visit: Payer: Self-pay | Admitting: Family

## 2019-06-23 DIAGNOSIS — R030 Elevated blood-pressure reading, without diagnosis of hypertension: Secondary | ICD-10-CM

## 2019-06-24 ENCOUNTER — Other Ambulatory Visit: Payer: Self-pay

## 2019-06-24 ENCOUNTER — Encounter: Payer: Self-pay | Admitting: Family

## 2019-06-24 ENCOUNTER — Telehealth: Payer: Self-pay

## 2019-06-24 ENCOUNTER — Ambulatory Visit (INDEPENDENT_AMBULATORY_CARE_PROVIDER_SITE_OTHER): Payer: 59 | Admitting: Family

## 2019-06-24 VITALS — BP 140/80 | Ht 65.0 in | Wt 300.0 lb

## 2019-06-24 DIAGNOSIS — Z1211 Encounter for screening for malignant neoplasm of colon: Secondary | ICD-10-CM

## 2019-06-24 DIAGNOSIS — R030 Elevated blood-pressure reading, without diagnosis of hypertension: Secondary | ICD-10-CM | POA: Diagnosis not present

## 2019-06-24 DIAGNOSIS — Z0000 Encounter for general adult medical examination without abnormal findings: Secondary | ICD-10-CM

## 2019-06-24 MED ORDER — AMLODIPINE BESYLATE 5 MG PO TABS: 5.0000 mg | ORAL_TABLET | Freq: Every day | ORAL | 3 refills | Status: DC

## 2019-06-24 MED ORDER — NA SULFATE-K SULFATE-MG SULF 17.5-3.13-1.6 GM/177ML PO SOLN: 354.0000 mL | Freq: Once | ORAL | 0 refills | Status: AC

## 2019-06-24 NOTE — Progress Notes (Signed)
Virtual Visit via Video Note  I connected with@  on 06/24/19 at  8:00 AM EST by a video enabled telemedicine application and verified that I am speaking with the correct person using two identifiers.  Location patient: home Location provider:home office Persons participating in the virtual visit: patient, provider  I discussed the limitations of evaluation and management by telemedicine and the availability of in person appointments. The patient expressed understanding and agreed to proceed.   HPI:  Feels well. No complaints. 'happy go lucky' Working from home.   Trying to loose weight, and eating less food.   HTN- checks at home 130-140/ 80. No cp, sob.   Due colonoscopy UTD mammogram, Pap UTD from Melody CNM;      ROS: See pertinent positives and negatives per HPI.  Past Medical History:  Diagnosis Date  . Anemia   . Arthritis    Bilateral knee  . History of chicken pox     Past Surgical History:  Procedure Laterality Date  . FOOT FRACTURE SURGERY Left    2002  . JOINT REPLACEMENT    . KNEE ARTHROSCOPY WITH MEDIAL MENISECTOMY Left 11/28/2014   Procedure: KNEE ARTHROSCOPY WITH MEDIAL MENISECTOMY;  Surgeon: Hessie Knows, MD;  Location: [facility];  Service: Orthopedics;  Laterality: Left;  Marland Kitchen MEDIAL PARTIAL KNEE REPLACEMENT Right 2013  . meniscus tear Right 2012    Family History  Problem Relation Age of Onset  . Hypertension Mother   . Diabetes Mother   . Hypertension Father   . Heart disease Father 43       CAD - died MI  . Diabetes Maternal Grandmother   . Colon cancer Neg Hx   . Breast cancer Neg Hx     SOCIAL HX: never smoker   Current Outpatient Medications:  .  cetirizine (ZYRTEC) 10 MG tablet, Take 10 mg by mouth daily., Disp: , Rfl:  .  diclofenac (VOLTAREN) 75 MG EC tablet, , Disp: , Rfl:  .  levonorgestrel (MIRENA) 20 MCG/24HR IUD, 1 each by Intrauterine route once., Disp: , Rfl:  .  amLODipine (NORVASC) 5 MG tablet, Take 1 tablet (5 mg total)  by mouth daily., Disp: 90 tablet, Rfl: 3  EXAM:  VITALS per patient if applicable: Vitals:   0000000 0802  BP: 140/80   Wt Readings from Last 3 Encounters:  06/24/19 300 lb (136.1 kg)  02/23/19 (!) 303 lb 12.8 oz (137.8 kg)  01/14/19 (!) 310 lb 4.8 oz (140.8 kg)    GENERAL: alert, oriented, appears well and in no acute distress  HEENT: atraumatic, conjunttiva clear, no obvious abnormalities on inspection of external nose and ears  NECK: normal movements of the head and neck  LUNGS: on inspection no signs of respiratory distress, breathing rate appears normal, no obvious gross SOB, gasping or wheezing  CV: no obvious cyanosis  MS: moves all visible extremities without noticeable abnormality  PSYCH/NEURO: pleasant and cooperative, no obvious depression or anxiety, speech and thought processing grossly intact  ASSESSMENT AND PLAN:  Discussed the following assessment and plan:  Routine general medical examination at a health care facility - Plan: Colonoscopy-Ambulatory referral to Gastroenterology-45 to 75 years, 3D mammogram- MM SCREENING BREAST TOMO BILATERAL, CBC with Differential/Platelet, Comprehensive metabolic panel-FUTURE, Hemoglobin A1c, Lipid panel-future, TSH-future, VITAMIN D 25 Hydroxy (Vit-D Deficiency, Fractures), amLODipine (NORVASC) 5 MG tablet  Elevated blood pressure reading - Plan: amLODipine (NORVASC) 5 MG tablet Problem List Items Addressed This Visit      Cardiovascular and Mediastinum  HTN (hypertension)    Slightly elevated; will increase amlodipine to 5 mg patient let me know if she does not reach  goal less than 120/80      Relevant Medications   amLODipine (NORVASC) 5 MG tablet     Other   Routine general medical examination at a health care facility - Primary    UTD mammogram; emphasized SBE. Pap UTD.  Ordered mammogram and patient understands to schedule.  Referral to GI for colonoscopy      Relevant Medications   amLODipine (NORVASC) 5  MG tablet   Other Relevant Orders   Colonoscopy-Ambulatory referral to Gastroenterology-45 to 75 years   3D mammogram- MM SCREENING BREAST TOMO BILATERAL   CBC with Differential/Platelet   Comprehensive metabolic panel-FUTURE   Hemoglobin A1c   Lipid panel-future   TSH-future   VITAMIN D 25 Hydroxy (Vit-D Deficiency, Fractures)      -we discussed possible serious and likely etiologies, options for evaluation and workup, limitations of telemedicine visit vs in person visit, treatment, treatment risks and precautions. Pt prefers to treat via telemedicine empirically rather then risking or undertaking an in person visit at this moment. Patient agrees to seek prompt in person care if worsening, new symptoms arise, or if is not improving with treatment.   I discussed the assessment and treatment plan with the patient. The patient was provided an opportunity to ask questions and all were answered. The patient agreed with the plan and demonstrated an understanding of the instructions.   The patient was advised to call back or seek an in-person evaluation if the symptoms worsen or if the condition fails to improve as anticipated.   Mable Paris, FNP

## 2019-06-24 NOTE — Telephone Encounter (Signed)
Gastroenterology Pre-Procedure Review    PATIENT REVIEW QUESTIONS: The patient responded to the following health history questions as indicated:    1. Are you having any GI issues? no 2. Do you have a personal history of Polyps? no 3. Do you have a family history of Colon Cancer or Polyps? no 4. Diabetes Mellitus? no 5. Joint replacements in the past 12 months?no 6. Major health problems in the past 3 months?no 7. Any artificial heart valves, MVP, or defibrillator?no    MEDICATIONS & ALLERGIES:    Patient reports the following regarding taking any anticoagulation/antiplatelet therapy:   Plavix, Coumadin, Eliquis, Xarelto, Lovenox, Pradaxa, Brilinta, or Effient? no Aspirin? no  Patient confirms/reports the following medications:  Current Outpatient Medications  Medication Sig Dispense Refill  . amLODipine (NORVASC) 5 MG tablet Take 1 tablet (5 mg total) by mouth daily. 90 tablet 3  . cetirizine (ZYRTEC) 10 MG tablet Take 10 mg by mouth daily.    . diclofenac (VOLTAREN) 75 MG EC tablet     . levonorgestrel (MIRENA) 20 MCG/24HR IUD 1 each by Intrauterine route once.     No current facility-administered medications for this visit.    Patient confirms/reports the following allergies:  No Known Allergies  No orders of the defined types were placed in this encounter.   AUTHORIZATION INFORMATION Primary Insurance: 1D#: Group #:  Secondary Insurance: 1D#: Group #:  SCHEDULE INFORMATION: Date: 07/18/2019 Time: Location:[facility]

## 2019-06-24 NOTE — Assessment & Plan Note (Addendum)
UTD mammogram; emphasized SBE. Pap UTD.  Ordered mammogram and patient understands to schedule.  Referral to GI for colonoscopy

## 2019-06-24 NOTE — Assessment & Plan Note (Signed)
Slightly elevated; will increase amlodipine to 5 mg patient let me know if she does not reach  goal less than 120/80

## 2019-06-29 ENCOUNTER — Telehealth: Payer: Self-pay | Admitting: Gastroenterology

## 2019-06-29 NOTE — Telephone Encounter (Signed)
Pt is calling she needs a cheaper prescription for her procedure on 07/17/18 to  [facility] [place] please call pt

## 2019-07-04 ENCOUNTER — Other Ambulatory Visit: Payer: Self-pay

## 2019-07-04 DIAGNOSIS — Z1211 Encounter for screening for malignant neoplasm of colon: Secondary | ICD-10-CM

## 2019-07-04 MED ORDER — GOLYTELY 236 G PO SOLR: 4000.0000 mL | Freq: Once | ORAL | 0 refills | Status: AC

## 2019-07-04 NOTE — Telephone Encounter (Signed)
Bowel prep has been changed to Golytely bowel prep.  Rx sent to [facility] in [place].  Pt notified of rx change, and advised to drink 8 oz every 97mins the evening before her colonoscopy.  Nothing to eat or drink 4 hours prior to procedure.  Thanks [employer]

## 2019-07-05 ENCOUNTER — Other Ambulatory Visit: Payer: Self-pay

## 2019-07-05 ENCOUNTER — Other Ambulatory Visit (INDEPENDENT_AMBULATORY_CARE_PROVIDER_SITE_OTHER): Payer: 59

## 2019-07-05 DIAGNOSIS — Z0000 Encounter for general adult medical examination without abnormal findings: Secondary | ICD-10-CM

## 2019-07-05 LAB — CBC WITH DIFFERENTIAL/PLATELET
Basophils Absolute: 0.1 10*3/uL (ref 0.0–0.1)
Basophils Relative: 0.6 % (ref 0.0–3.0)
Eosinophils Absolute: 0.1 10*3/uL (ref 0.0–0.7)
Eosinophils Relative: 0.7 % (ref 0.0–5.0)
HCT: 41.1 % (ref 36.0–46.0)
Hemoglobin: 13.1 g/dL (ref 12.0–15.0)
Lymphocytes Relative: 31.3 % (ref 12.0–46.0)
Lymphs Abs: 2.6 10*3/uL (ref 0.7–4.0)
MCHC: 31.9 g/dL (ref 30.0–36.0)
MCV: 88.1 fl (ref 78.0–100.0)
Monocytes Absolute: 0.5 10*3/uL (ref 0.1–1.0)
Monocytes Relative: 5.7 % (ref 3.0–12.0)
Neutro Abs: 5.2 10*3/uL (ref 1.4–7.7)
Neutrophils Relative %: 61.7 % (ref 43.0–77.0)
Platelets: 257 10*3/uL (ref 150.0–400.0)
RBC: 4.66 Mil/uL (ref 3.87–5.11)
RDW: 15.3 % (ref 11.5–15.5)
WBC: 8.4 10*3/uL (ref 4.0–10.5)

## 2019-07-05 LAB — HEMOGLOBIN A1C: Hgb A1c MFr Bld: 5.6 % (ref 4.6–6.5)

## 2019-07-05 LAB — COMPREHENSIVE METABOLIC PANEL
ALT: 13 U/L (ref 0–35)
AST: 17 U/L (ref 0–37)
Albumin: 4.4 g/dL (ref 3.5–5.2)
Alkaline Phosphatase: 89 U/L (ref 39–117)
BUN: 13 mg/dL (ref 6–23)
CO2: 25 mEq/L (ref 19–32)
Calcium: 9.9 mg/dL (ref 8.4–10.5)
Chloride: 104 mEq/L (ref 96–112)
Creatinine, Ser: 0.73 mg/dL (ref 0.40–1.20)
GFR: 101.86 mL/min (ref >60.00)
Glucose, Bld: 100 mg/dL — ABNORMAL HIGH (ref 70–99)
Potassium: 4.2 mEq/L (ref 3.5–5.1)
Sodium: 137 mEq/L (ref 135–145)
Total Bilirubin: 0.4 mg/dL (ref 0.2–1.2)
Total Protein: 7.9 g/dL (ref 6.0–8.3)

## 2019-07-05 LAB — LIPID PANEL
Cholesterol: 165 mg/dL (ref 0–200)
HDL: 48.8 mg/dL (ref >39.00)
LDL Cholesterol: 107 mg/dL — ABNORMAL HIGH (ref 0–99)
NonHDL: 116.38
Total CHOL/HDL Ratio: 3
Triglycerides: 48 mg/dL (ref 0.0–149.0)
VLDL: 9.6 mg/dL (ref 0.0–40.0)

## 2019-07-05 LAB — VITAMIN D 25 HYDROXY (VIT D DEFICIENCY, FRACTURES): VITD: 36.21 ng/mL (ref 30.00–100.00)

## 2019-07-05 LAB — TSH: TSH: 1.39 u[IU]/mL (ref 0.35–4.50)

## 2019-07-14 ENCOUNTER — Other Ambulatory Visit
Admission: RE | Admit: 2019-07-14 | Discharge: 2019-07-14 | Disposition: A | Discharge status: Home / Self Care | Payer: 59 | Source: Ambulatory Visit | Attending: Gastroenterology | Admitting: Gastroenterology

## 2019-07-14 ENCOUNTER — Other Ambulatory Visit: Payer: Self-pay

## 2019-07-14 DIAGNOSIS — Z20822 Contact with and (suspected) exposure to covid-19: Secondary | ICD-10-CM | POA: Insufficient documentation

## 2019-07-14 DIAGNOSIS — Z01812 Encounter for preprocedural laboratory examination: Secondary | ICD-10-CM | POA: Diagnosis present

## 2019-07-15 LAB — SARS CORONAVIRUS 2 (TAT 6-24 HRS): SARS Coronavirus 2: NEGATIVE

## 2019-07-18 ENCOUNTER — Ambulatory Visit
Admission: RE | Admit: 2019-07-18 | Discharge: 2019-07-18 | Disposition: A | Discharge status: Home / Self Care | Payer: 59 | Attending: Gastroenterology | Admitting: Gastroenterology

## 2019-07-18 ENCOUNTER — Encounter
Admission: RE | Disposition: A | Discharge status: Home / Self Care | Payer: Self-pay | Source: Home / Self Care | Attending: Gastroenterology

## 2019-07-18 ENCOUNTER — Ambulatory Visit: Payer: 59 | Admitting: Certified Registered Nurse Anesthetist

## 2019-07-18 ENCOUNTER — Encounter: Payer: Self-pay | Admitting: Gastroenterology

## 2019-07-18 ENCOUNTER — Other Ambulatory Visit: Payer: Self-pay

## 2019-07-18 DIAGNOSIS — I10 Essential (primary) hypertension: Secondary | ICD-10-CM | POA: Insufficient documentation

## 2019-07-18 DIAGNOSIS — Z1211 Encounter for screening for malignant neoplasm of colon: Secondary | ICD-10-CM | POA: Diagnosis not present

## 2019-07-18 DIAGNOSIS — K5730 Diverticulosis of large intestine without perforation or abscess without bleeding: Secondary | ICD-10-CM | POA: Insufficient documentation

## 2019-07-18 DIAGNOSIS — Z79899 Other long term (current) drug therapy: Secondary | ICD-10-CM | POA: Diagnosis not present

## 2019-07-18 DIAGNOSIS — D120 Benign neoplasm of cecum: Secondary | ICD-10-CM | POA: Insufficient documentation

## 2019-07-18 DIAGNOSIS — Z96651 Presence of right artificial knee joint: Secondary | ICD-10-CM | POA: Insufficient documentation

## 2019-07-18 DIAGNOSIS — Z793 Long term (current) use of hormonal contraceptives: Secondary | ICD-10-CM | POA: Diagnosis not present

## 2019-07-18 DIAGNOSIS — Z6841 Body Mass Index (BMI) 40.0 and over, adult: Secondary | ICD-10-CM | POA: Insufficient documentation

## 2019-07-18 HISTORY — PX: COLONOSCOPY WITH PROPOFOL: SHX5780

## 2019-07-18 LAB — POCT PREGNANCY, URINE: Preg Test, Ur: NEGATIVE

## 2019-07-18 SURGERY — COLONOSCOPY WITH PROPOFOL
Anesthesia: General

## 2019-07-18 MED ORDER — PROPOFOL 10 MG/ML IV BOLUS
INTRAVENOUS | Status: AC
  Filled 2019-07-18: qty 20

## 2019-07-18 MED ORDER — SODIUM CHLORIDE 0.9 % IV SOLN: INTRAVENOUS | Status: DC | PRN

## 2019-07-18 MED ORDER — SODIUM CHLORIDE 0.9 % IV SOLN: INTRAVENOUS | Status: DC

## 2019-07-18 MED ORDER — PROPOFOL 500 MG/50ML IV EMUL
INTRAVENOUS | Status: AC
  Filled 2019-07-18: qty 50

## 2019-07-18 MED ORDER — LIDOCAINE HCL (CARDIAC) PF 100 MG/5ML IV SOSY
PREFILLED_SYRINGE | INTRAVENOUS | Status: DC | PRN
  Administered 2019-07-18: 100 mg via INTRAVENOUS

## 2019-07-18 MED ORDER — PROPOFOL 500 MG/50ML IV EMUL
INTRAVENOUS | Status: DC | PRN
  Administered 2019-07-18: 150 ug/kg/min via INTRAVENOUS

## 2019-07-18 MED ORDER — PROPOFOL 10 MG/ML IV BOLUS
INTRAVENOUS | Status: DC | PRN
  Administered 2019-07-18: 90 mg via INTRAVENOUS

## 2019-07-18 NOTE — Anesthesia Postprocedure Evaluation (Signed)
Anesthesia Post Note  Patient: Lorraine Holmes  Procedure(s) Performed: COLONOSCOPY WITH PROPOFOL (N/A )  Patient location during evaluation: Endoscopy Anesthesia Type: General Level of consciousness: awake and alert Pain management: pain level controlled Vital Signs Assessment: post-procedure vital signs reviewed and stable Respiratory status: spontaneous breathing and respiratory function stable Cardiovascular status: stable Anesthetic complications: no     Last Vitals:  Vitals:   07/18/19 1057 07/18/19 1100  BP: 115/77 115/77  Pulse: 82 81  Resp: (!) 21 19  Temp:    SpO2: 100% 100%    Last Pain:  Vitals:   07/18/19 1100  TempSrc:   PainSc: 0-No pain                 Everlene Cunning K

## 2019-07-18 NOTE — Anesthesia Preprocedure Evaluation (Signed)
Anesthesia Evaluation  Patient identified by MRN, date of birth, ID band Patient awake    Reviewed: Allergy & Precautions, NPO status , Patient's Chart, lab work & pertinent test results  History of Anesthesia Complications Negative for: history of anesthetic complications  Airway Mallampati: III       Dental   Pulmonary neg sleep apnea, neg COPD, Not current smoker,           Cardiovascular hypertension, Pt. on medications (-) Past MI and (-) CHF (-) dysrhythmias (-) Valvular Problems/Murmurs     Neuro/Psych neg Seizures    GI/Hepatic Neg liver ROS, neg GERD  ,  Endo/Other  neg diabetesMorbid obesity  Renal/GU negative Renal ROS     Musculoskeletal   Abdominal   Peds  Hematology  (+) anemia ,   Anesthesia Other Findings   Reproductive/Obstetrics                             Anesthesia Physical Anesthesia Plan  ASA: III  Anesthesia Plan: General   Post-op Pain Management:    Induction: Intravenous  PONV Risk Score and Plan: 3 and Propofol infusion, TIVA and Treatment may vary due to age or medical condition  Airway Management Planned: Nasal Cannula  Additional Equipment:   Intra-op Plan:   Post-operative Plan:   Informed Consent: I have reviewed the patients History and Physical, chart, labs and discussed the procedure including the risks, benefits and alternatives for the proposed anesthesia with the patient or authorized representative who has indicated his/her understanding and acceptance.       Plan Discussed with:   Anesthesia Plan Comments:         Anesthesia Quick Evaluation

## 2019-07-18 NOTE — Op Note (Signed)
[facility] Gastroenterology Patient Name: Lorraine Holmes Procedure Date: 07/18/2019 10:14 AM MRN: [id number] Account #: [id number] Date of Birth: 1970/10/16 Admit Type: Outpatient Age: 49 Room: [facility] ENDO ROOM 3 Gender: Female Note Status: Finalized Procedure:             Colonoscopy Indications:           Screening for colorectal malignant neoplasm, This is                         the patient's first colonoscopy Providers:             Lin Landsman MD, MD Referring MD:          No Local Md, MD (Referring MD) Medicines:             Monitored Anesthesia Care Complications:         No immediate complications. Estimated blood loss: None. Procedure:             Pre-Anesthesia Assessment:                        - Prior to the procedure, a History and Physical was                         performed, and patient medications and allergies were                         reviewed. The patient is competent. The risks and                         benefits of the procedure and the sedation options and                         risks were discussed with the patient. All questions                         were answered and informed consent was obtained.                         Patient identification and proposed procedure were                         verified by the physician, the nurse, the                         anesthesiologist, the anesthetist and the technician                         in the pre-procedure area in the procedure room in the                         endoscopy suite. Mental Status Examination: alert and                         oriented. Airway Examination: normal oropharyngeal                         airway and neck mobility. Respiratory Examination:  clear to auscultation. CV Examination: normal.                         Prophylactic Antibiotics: The patient does not require                         prophylactic antibiotics. Prior Anticoagulants:  The                         patient has taken no previous anticoagulant or                         antiplatelet agents. ASA Grade Assessment: III - A                         patient with severe systemic disease. After reviewing                         the risks and benefits, the patient was deemed in                         satisfactory condition to undergo the procedure. The                         anesthesia plan was to use monitored anesthesia care                         (MAC). Immediately prior to administration of                         medications, the patient was re-assessed for adequacy                         to receive sedatives. The heart rate, respiratory                         rate, oxygen saturations, blood pressure, adequacy of                         pulmonary ventilation, and response to care were                         monitored throughout the procedure. The physical                         status of the patient was re-assessed after the                         procedure.                        After obtaining informed consent, the colonoscope was                         passed under direct vision. Throughout the procedure,                         the patient's blood pressure, pulse, and oxygen  saturations were monitored continuously. The                         Colonoscope was introduced through the anus and                         advanced to the the cecum, identified by appendiceal                         orifice and ileocecal valve. The colonoscopy was                         performed without difficulty. The patient tolerated                         the procedure well. The quality of the bowel                         preparation was evaluated using the BBPS [facility] Bowel                         Preparation Scale) with scores of: Right Colon = 3,                         Transverse Colon = 3 and Left Colon = 3 (entire mucosa                          seen well with no residual staining, small fragments                         of stool or opaque liquid). The total BBPS score                         equals 9. Findings:      The perianal and digital rectal examinations were normal. Pertinent       negatives include normal sphincter tone and no palpable rectal lesions.      A diminutive polyp was found in the cecum. The polyp was sessile. The       polyp was removed with a cold biopsy forceps. Resection and retrieval       were complete.      Multiple diverticula were found in the sigmoid colon.      The retroflexed view of the distal rectum and anal verge was normal and       showed no anal or rectal abnormalities.      The exam was otherwise without abnormality. Impression:            - One diminutive polyp in the cecum, removed with a                         cold biopsy forceps. Resected and retrieved.                        - Diverticulosis in the sigmoid colon.                        - The distal rectum and anal verge are normal on  retroflexion view.                        - The examination was otherwise normal. Recommendation:        - Discharge patient to home (with escort).                        - Resume previous diet today.                        - Continue present medications.                        - Await pathology results.                        - Repeat colonoscopy in 7-10 years for surveillance                         based on pathology results. Procedure Code(s):     --- Professional ---                        [phone], Colonoscopy, flexible; with biopsy, single or                         multiple Diagnosis Code(s):     --- Professional ---                        Z12.11, Encounter for screening for malignant neoplasm                         of colon                        K63.5, Polyp of colon                        K57.30, Diverticulosis of large intestine without                          perforation or abscess without bleeding CPT copyright 2019 American Medical Association. All rights reserved. The codes documented in this report are preliminary and upon coder review may  be revised to meet current compliance requirements. Dr. Ulyess Mort Lin Landsman MD, MD 07/18/2019 10:42:10 AM This report has been signed electronically. Number of Addenda: 0 Note Initiated On: 07/18/2019 10:14 AM Scope Withdrawal Time: 0 hours 10 minutes 23 seconds  Total Procedure Duration: 0 hours 14 minutes 49 seconds  Estimated Blood Loss:  Estimated blood loss: none.      [facility]

## 2019-07-18 NOTE — H&P (Signed)
Cephas Darby, MD [address]  [place], [place] [address]  Main: [phone]  Fax: [phone] Pager: [phone]  Primary Care Physician:  Burnard Hawthorne, FNP Primary Gastroenterologist:  Dr. Cephas Darby  Pre-Procedure History & Physical: HPI:  Lorraine Holmes is a 49 y.o. female is here for an colonoscopy.   Past Medical History:  Diagnosis Date  . Anemia   . Arthritis    Bilateral knee  . History of chicken pox     Past Surgical History:  Procedure Laterality Date  . FOOT FRACTURE SURGERY Left    2002  . JOINT REPLACEMENT    . KNEE ARTHROSCOPY WITH MEDIAL MENISECTOMY Left 11/28/2014   Procedure: KNEE ARTHROSCOPY WITH MEDIAL MENISECTOMY;  Surgeon: Hessie Knows, MD;  Location: [facility] ORS;  Service: Orthopedics;  Laterality: Left;  Marland Kitchen MEDIAL PARTIAL KNEE REPLACEMENT Right 2013  . meniscus tear Right 2012    Prior to Admission medications   Medication Sig Start Date End Date Taking? Authorizing Provider  amLODipine (NORVASC) 5 MG tablet Take 1 tablet (5 mg total) by mouth daily. 06/24/19  Yes Arnett, Yvetta Coder, FNP  cetirizine (ZYRTEC) 10 MG tablet Take 10 mg by mouth daily.   Yes [provider]  diclofenac (VOLTAREN) 75 MG EC tablet  01/13/19  Yes [provider]  levonorgestrel (MIRENA) 20 MCG/24HR IUD 1 each by Intrauterine route once.    [provider]    Allergies as of 06/24/2019  . (No Known Allergies)    Family History  Problem Relation Age of Onset  . Hypertension Mother   . Diabetes Mother   . Hypertension Father   . Heart disease Father 5       CAD - died MI  . Diabetes Maternal Grandmother   . Colon cancer Neg Hx   . Breast cancer Neg Hx     Social History   Socioeconomic History  . Marital status: Single    Spouse name: Not on file  . Number of children: 2  . Years of education: 56  . Highest education level: Not on file  Occupational History  . 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 Smoking status: Never Smoker  . Smokeless tobacco: Never Used  Substance and Sexual Activity  . Alcohol use: Yes    Alcohol/week: 2.0 standard drinks    Types: 2 Standard drinks or equivalent per week    Comment: occ  . Drug use: No  . Sexual activity: Yes    Birth control/protection: I.U.D.    Comment: mirena  Other Topics Concern  . Not on file  Social History Narrative   Lesette grew up in [place]. She lives at home with her children and her mother. 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       Working [employer] in [place] Strain:   . Difficulty of Paying Living Expenses: Not on file  Food Insecurity:   . Worried About Charity fundraiser in the Last Year: Not on file  . Ran Out of Food in the Last Year: Not on file  Transportation Needs:   . Lack of Transportation (Medical): Not on file  . Lack of Transportation (Non-Medical): Not on file  Physical Activity:   . Days of Exercise per  Week: Not on file  . Minutes of Exercise per Session: Not on file  Stress:   . Feeling of Stress : Not on file  Social Connections:   . Frequency of Communication with Friends and Family: Not on file  . Frequency of Social Gatherings with Friends and Family: Not on file  . Attends Religious Services: Not on file  . Active Member of Clubs or Organizations: Not on file  . Attends Archivist Meetings: Not on file  . Marital Status: Not on file  Intimate Partner Violence:   . Fear of Current or Ex-Partner: Not on file  . Emotionally Abused: Not on file  . Physically Abused: Not on file  . Sexually Abused: Not on file    Review of Systems: See HPI, otherwise negative ROS  Physical Exam: BP (!) 141/92   Pulse 84   Temp 97.6 F (36.4 C) (Temporal)   Resp 18   Ht 5\' 5"  (1.651 m)   Wt 136.1 kg    SpO2 100%   BMI 49.92 kg/m  General:   Alert,  pleasant and cooperative in NAD Head:  Normocephalic and atraumatic. Neck:  Supple; no masses or thyromegaly. Lungs:  Clear throughout to auscultation.    Heart:  Regular rate and rhythm. Abdomen:  Soft, nontender and nondistended. Normal bowel sounds, without guarding, and without rebound.   Neurologic:  Alert and  oriented x4;  grossly normal neurologically.  Impression/Plan: Kennis D Schultes is here for an colonoscopy to be performed for colon cancer screening  Risks, benefits, limitations, and alternatives regarding  colonoscopy have been reviewed with the patient.  Questions have been answered.  All parties agreeable.   Sherri Sear, MD  07/18/2019, 9:51 AM

## 2019-07-18 NOTE — Transfer of Care (Signed)
Immediate Anesthesia Transfer of Care Note  Patient: Lorraine Holmes  Procedure(s) Performed: COLONOSCOPY WITH PROPOFOL (N/A )  Patient Location: PACU and Endoscopy Unit  Anesthesia Type:General  Level of Consciousness: awake, alert , oriented and patient cooperative  Airway & Oxygen Therapy: Patient Spontanous Breathing  Post-op Assessment: Report given to RN and Post -op Vital signs reviewed and stable  Post vital signs: Reviewed and stable  Last Vitals:  Vitals Value Taken Time  BP 102/71 07/18/19 1047  Temp    Pulse 85 07/18/19 1048  Resp 18 07/18/19 1048  SpO2 100 % 07/18/19 1048  Vitals shown include unvalidated device data.  Last Pain:  Vitals:   07/18/19 0938  TempSrc: Temporal  PainSc: 0-No pain         Complications: No apparent anesthesia complications

## 2019-07-19 ENCOUNTER — Encounter: Payer: Self-pay | Admitting: Family

## 2019-07-19 ENCOUNTER — Encounter: Payer: Self-pay | Admitting: *Deleted

## 2019-07-19 LAB — SURGICAL PATHOLOGY

## 2019-07-20 ENCOUNTER — Encounter: Payer: Self-pay | Admitting: Gastroenterology

## 2019-08-12 ENCOUNTER — Other Ambulatory Visit: Payer: Self-pay | Admitting: Family

## 2019-08-12 DIAGNOSIS — R030 Elevated blood-pressure reading, without diagnosis of hypertension: Secondary | ICD-10-CM

## 2019-08-12 DIAGNOSIS — Z0000 Encounter for general adult medical examination without abnormal findings: Secondary | ICD-10-CM

## 2019-08-30 ENCOUNTER — Inpatient Hospital Stay: Admission: RE | Admit: 2019-08-30 | Payer: 59 | Source: Ambulatory Visit

## 2019-08-30 ENCOUNTER — Other Ambulatory Visit: Payer: Self-pay

## 2019-08-30 ENCOUNTER — Ambulatory Visit
Admission: RE | Admit: 2019-08-30 | Discharge: 2019-08-30 | Disposition: A | Discharge status: Home / Self Care | Payer: 59 | Source: Ambulatory Visit | Attending: Family | Admitting: Family

## 2019-08-30 DIAGNOSIS — Z1231 Encounter for screening mammogram for malignant neoplasm of breast: Secondary | ICD-10-CM | POA: Insufficient documentation

## 2019-08-30 DIAGNOSIS — Z0000 Encounter for general adult medical examination without abnormal findings: Secondary | ICD-10-CM

## 2019-12-23 ENCOUNTER — Other Ambulatory Visit: Payer: Self-pay

## 2019-12-23 ENCOUNTER — Ambulatory Visit: Payer: 59 | Admitting: Family

## 2019-12-23 VITALS — BP 128/76 | HR 80 | Temp 97.8°F | Resp 16 | Wt 298.6 lb

## 2019-12-23 DIAGNOSIS — I10 Essential (primary) hypertension: Secondary | ICD-10-CM | POA: Diagnosis not present

## 2019-12-23 DIAGNOSIS — E669 Obesity, unspecified: Secondary | ICD-10-CM | POA: Diagnosis not present

## 2019-12-23 DIAGNOSIS — M25561 Pain in right knee: Secondary | ICD-10-CM | POA: Diagnosis not present

## 2019-12-23 DIAGNOSIS — M25562 Pain in left knee: Secondary | ICD-10-CM

## 2019-12-23 DIAGNOSIS — G8929 Other chronic pain: Secondary | ICD-10-CM

## 2019-12-23 MED ORDER — MELOXICAM 7.5 MG PO TABS: 7.5000 mg | ORAL_TABLET | Freq: Every day | ORAL | 1 refills | Status: DC | PRN

## 2019-12-23 MED ORDER — SAXENDA 18 MG/3ML ~~LOC~~ SOPN: 0.6000 mg | PEN_INJECTOR | Freq: Every day | SUBCUTANEOUS | 3 refills | Status: DC

## 2019-12-23 NOTE — Assessment & Plan Note (Signed)
Chronic, at baseline.  We discussed that weight is likely contributory.  I have given her meloxicam to be used as needed.

## 2019-12-23 NOTE — Assessment & Plan Note (Signed)
Discussed low glycemic index diet. Trial of saxenda.  Discussed black box warning on for patient today.  She will alert me with any changes in personal family history as it relates to thyroid cancer.

## 2019-12-23 NOTE — Assessment & Plan Note (Signed)
Elevated today, patient politely declines increasing amlodipine at today's visit.  She would like to focus on weight loss which I think is appropriate.  She will also be more consistent with amlodipine.  Close follow-up.

## 2019-12-23 NOTE — Patient Instructions (Addendum)
Trial of taking amlodipine more consistently and also in the evening to see if this lowers blood pressure closer to goal of less than 120/80   trial of Saxenda. Please read information on medication below and remember black box warning that you may not take if you or a family member is diagnosed with thyroid cancer.    You may increase by 0.6 mg/day once a week. For instance, first week, take 0.6mg  [place] daily. Second week take 1.2 mg [place] daily. Third week take 1.8 mg [place] daily...   Max is 3 mg/day.    Remember the goal of weight loss is 1 to 2 pounds maximum per week. Its VERY reasonable to stay on a dose for a couple of weeks ( or more) prior to increasing. We dont want to increase too fast.   Good luck!  Mobic as needed for knee pain A couple of points in regards to meloxicam ( Mobic) which you are on.   This medication is not intended for daily , long term use. It is a potent anti inflammatory ( NSAID), and my intention is for you take as needed for moderate to severe pain. If you find yourself using daily, please let me know.   Please takes Mobic ( meloxicam) with FOOD since it is an anti-inflammatory as it can cause a GI bleed or ulcer. If you have a history of GI bleed or ulcer, please do NOT take.  Do no take over the counter aleve, motrin, advil, goody's powder for pain as they are also NSAIDs, and they are  in the same class as Mobic  Lastly, we will need to monitor kidney function while on Mobic wer, and if we were to see any decline in kidney function in the future, we would have to discontinue this medication.      Liraglutide injection (Weight Management) What is this medicine? LIRAGLUTIDE (LIR a GLOO tide) is used to help people lose weight and maintain weight loss. It is used with a reduced-calorie diet and exercise. This medicine may be used for other purposes; ask your health care provider or pharmacist if you have questions. COMMON BRAND NAME(S): Saxenda What should I tell  my health care provider before I take this medicine? They need to know if you have any of these conditions:  endocrine tumors (MEN 2) or if someone in your family had these tumors  gallbladder disease  high cholesterol  history of alcohol abuse problem  history of pancreatitis  kidney disease or if you are on dialysis  liver disease  previous swelling of the tongue, face, or lips with difficulty breathing, difficulty swallowing, hoarseness, or tightening of the throat  stomach problems  suicidal thoughts, plans, or attempt; a previous suicide attempt by you or a family member  thyroid cancer or if someone in your family had thyroid cancer  an unusual or allergic reaction to liraglutide, other medicines, foods, dyes, or preservatives  pregnant or trying to get pregnant  breast-feeding How should I use this medicine? This medicine is for injection under the skin of your upper leg, stomach area, or upper arm. You will be taught how to prepare and give this medicine. Use exactly as directed. Take your medicine at regular intervals. Do not take it more often than directed. This drug comes with INSTRUCTIONS FOR USE. Ask your pharmacist for directions on how to use this drug. Read the information carefully. Talk to your pharmacist or health care provider if you have questions. It is  important that you put your used needles and syringes in a special sharps container. Do not put them in a trash can. If you do not have a sharps container, call your pharmacist or healthcare provider to get one. A special MedGuide will be given to you by the pharmacist with each prescription and refill. Be sure to read this information carefully each time. Talk to your pediatrician regarding the use of this medicine in children. Special care may be needed. Overdosage: If you think you have taken too much of this medicine contact a poison control center or emergency room at once. NOTE: This medicine is only  for you. Do not share this medicine with others. What if I miss a dose? If you miss a dose, take it as soon as you can. If it is almost time for your next dose, take only that dose. Do not take double or extra doses. If you miss your dose for 3 days or more, call your doctor or health care professional to talk about how to restart this medicine. What may interact with this medicine?  insulin and other medicines for diabetes This list may not describe all possible interactions. Give your health care provider a list of all the medicines, herbs, non-prescription drugs, or dietary supplements you use. Also tell them if you smoke, drink alcohol, or use illegal drugs. Some items may interact with your medicine. What should I watch for while using this medicine? Visit your doctor or health care professional for regular checks on your progress. Drink plenty of fluids while taking this medicine. Check with your doctor or health care professional if you get an attack of severe diarrhea, nausea, and vomiting. The loss of too much body fluid can make it dangerous for you to take this medicine. This medicine may affect blood sugar levels. Ask your healthcare provider if changes in diet or medicines are needed if you have diabetes. Patients and their families should watch out for worsening depression or thoughts of suicide. Also watch out for sudden changes in feelings such as feeling anxious, agitated, panicky, irritable, hostile, aggressive, impulsive, severely restless, overly excited and hyperactive, or not being able to sleep. If this happens, especially at the beginning of treatment or after a change in dose, call your health care professional. Women should inform their health care provider if they wish to become pregnant or think they might be pregnant. Losing weight while pregnant is not advised and may cause harm to the unborn child. Talk to your health care provider for more information. What side effects  may I notice from receiving this medicine? Side effects that you should report to your doctor or health care professional as soon as possible:  allergic reactions like skin rash, itching or hives, swelling of the face, lips, or tongue  breathing problems  diarrhea that continues or is severe  lump or swelling on the neck  severe nausea  signs and symptoms of infection like fever or chills; cough; sore throat; pain or trouble passing urine  signs and symptoms of low blood sugar such as feeling anxious; confusion; dizziness; increased hunger; unusually weak or tired; increased sweating; shakiness; cold, clammy skin; irritable; headache; blurred vision; fast heartbeat; loss of consciousness  signs and symptoms of kidney injury like trouble passing urine or change in the amount of urine  trouble swallowing  unusual stomach upset or pain  vomiting Side effects that usually do not require medical attention (report to your doctor or health care professional if  they continue or are bothersome):  constipation  decreased appetite  diarrhea  fatigue  headache  nausea  pain, redness, or irritation at site where injected  stomach upset  stuffy or runny nose This list may not describe all possible side effects. Call your doctor for medical advice about side effects. You may report side effects to FDA at 1-800-FDA-1088. Where should I keep my medicine? Keep out of the reach of children. Store unopened pen in a refrigerator between 2 and 8 degrees C (36 and 46 degrees F). Do not freeze or use if the medicine has been frozen. Protect from light and excessive heat. After you first use the pen, it can be stored at room temperature between 15 and 30 degrees C (59 and 86 degrees F) or in a refrigerator. Throw away your used pen after 30 days or after the expiration date, whichever comes first. Do not store your pen with the needle attached. If the needle is left on, medicine may leak from  the pen. NOTE: This sheet is a summary. It may not cover all possible information. If you have questions about this medicine, talk to your doctor, pharmacist, or health care provider.  2020 Elsevier/Gold Standard (2019-04-28 21:16:59)  This is  Dr. Lupita Dawn  example of a  "Low GI"  Diet:  It will allow you to lose 4 to 8  lbs  per month if you follow it carefully.  Your goal with exercise is a minimum of 30 minutes of aerobic exercise 5 days per week (Walking does not count once it becomes easy!)    All of the foods can be found at grocery stores and in bulk at [employer].  The Atkins protein bars and shakes are available in more varieties at [employer], [employer] and [place].     7 AM Breakfast:  Choose from the following:  Low carbohydrate Protein  Shakes (I recommend the  Premier Protein chocolate shakes,  EAS AdvantEdge "Carb Control" shakes  Or the Atkins shakes all are under 3 net carbs)     a scrambled egg/bacon/cheese burrito made with Mission's "carb balance" whole wheat tortilla  (about 10 net carbs )  Regulatory affairs officer (basically a quiche without the pastry crust) that is eaten cold and very convenient way to get your eggs.  8 carbs)  If you make your own protein shakes, avoid bananas and pineapple,  And use low carb greek yogurt or original /unsweetened almond or soy milk    Avoid cereal and bananas, oatmeal and cream of wheat and grits. They are loaded with carbohydrates!   10 AM: high protein snack:  Protein bar by Atkins (the snack size, under 200 cal, usually < 6 net carbs).    A stick of cheese:  Around 1 carb,  100 cal     Dannon Light n Fit [place] Yogurt  (80 cal, 8 carbs)  Other so called "protein bars" and Greek yogurts tend to be loaded with carbohydrates.  Remember, in food advertising, the word "energy" is synonymous for " carbohydrate."  Lunch:   A Sandwich using the bread choices listed, Can use any  Eggs,  lunchmeat, grilled meat or canned  tuna), avocado, regular mayo/mustard  and cheese.  A Salad using blue cheese, ranch,  Goddess or vinagrette,  Avoid taco shells, croutons or "confetti" and no "candied nuts" but regular nuts OK.   No pretzels, nabs  or chips.  Pickles and miniature sweet peppers are a good  low carb alternative that provide a "crunch"  The bread is the only source of carbohydrate in a sandwich and  can be decreased by trying some of the attached alternatives to traditional loaf bread   Avoid "Low fat dressings, as well as [place] dressings They are loaded with sugar!   3 PM/ Mid day  Snack:  Consider  1 ounce of  almonds, walnuts, pistachios, pecans, peanuts,  Macadamia nuts or a nut medley.  Avoid "granola and granola bars "  Mixed nuts are ok in moderation as long as there are no raisins,  cranberries or dried fruit.   KIND bars are OK if you get the low glycemic index variety   Try the prosciutto/mozzarella cheese sticks by [employer]  In deli /backery section   High protein      6 PM  Dinner:     Meat/fowl/fish with a green salad, and either broccoli, cauliflower, green beans, spinach, brussel sprouts or  Lima beans. DO NOT BREAD THE PROTEIN!!      There is a low carb pasta by Dreamfield's that is acceptable and tastes great: only 5 digestible carbs/serving.( All grocery stores but BJs carry it ) Several ready made meals are available low carb:   Try Michel Angelo's chicken piccata or chicken or eggplant parm over low carb pasta.(Lowes and BJs)   Marjory Lies Sanchez's "Carnitas" (pulled pork, no sauce,  0 carbs) or his beef pot roast to make a dinner burrito (at [employer]J's)  Pesto over low carb pasta (bj's sells a good quality pesto in the center refrigerated section of the deli   Try satueeing  Cheral Marker with mushroooms as a good side   [employer] Giant makes a mashed cauliflower that tastes like mashed potatoes  Whole wheat pasta is still full of digestible carbs and  Not as low in glycemic index as  Dreamfield's.   Brown rice is still rice,  So skip the rice and noodles if you eat [place] or [place] (or at least limit to 1/2 cup)  9 PM snack :   Breyer's "low carb" fudgsicle or  ice cream bar (Carb Smart line), or  Weight Watcher's ice cream bar , or another "no sugar added" ice cream;  a serving of fresh berries/cherries with whipped cream   Cheese or DANNON'S LlGHT N FIT GREEK YOGURT  8 ounces of [place] unsweetened almond/cococunut milk    Treat yourself to a parfait made with whipped cream blueberiies, walnuts and vanilla greek yogurt  Avoid bananas, pineapple, grapes  and watermelon on a regular basis because they are high in sugar.  THINK OF THEM AS DESSERT  Remember that snack Substitutions should be less than 10 NET carbs per serving and meals < 20 carbs. Remember to subtract fiber grams to get the "net carbs."  @TULLOBREADPACKAGE @

## 2019-12-23 NOTE — Progress Notes (Signed)
Subjective:    Patient ID: Lorraine Holmes, female    DOB: 10-Dec-1970, 49 y.o.   MRN: [id number]  CC: Lorraine Holmes is a 49 y.o. female who presents today for follow up.   HPI: Remains frustrated by weight.   No h/o personal or family h/o thyroid cancer.   Tried wellbutrin in the past.   HTN- 135/70 at home. Will forget to take amlodipine. Takes in the morning. No cp, sob.   hasnt been taking voltaren tablet in months. Would like medication to use prn for bilateral knee pain as trying 'by time before knee replacement'. Has been using topical.   No gib.   Active and mows yard when knees allow.     HISTORY:  Past Medical History:  Diagnosis Date  . Anemia   . Arthritis    Bilateral knee  . History of chicken pox    Past Surgical History:  Procedure Laterality Date  . COLONOSCOPY WITH PROPOFOL N/A 07/18/2019   Procedure: COLONOSCOPY WITH PROPOFOL;  Surgeon: Lin Landsman, MD;  Location: [facility] ENDOSCOPY;  Service: Gastroenterology;  Laterality: N/A;  . FOOT FRACTURE SURGERY Left    2002  . JOINT REPLACEMENT    . KNEE ARTHROSCOPY WITH MEDIAL MENISECTOMY Left 11/28/2014   Procedure: KNEE ARTHROSCOPY WITH MEDIAL MENISECTOMY;  Surgeon: Hessie Knows, MD;  Location: [facility] ORS;  Service: Orthopedics;  Laterality: Left;  Marland Kitchen MEDIAL PARTIAL KNEE REPLACEMENT Right 2013  . meniscus tear Right 2012   Family History  Problem Relation Age of Onset  . Hypertension Mother   . Diabetes Mother   . Hypertension Father   . Heart disease Father 48       CAD - died MI  . Diabetes Maternal Grandmother   . Colon cancer Neg Hx   . Breast cancer Neg Hx     Allergies: Patient has no known allergies. Current Outpatient Medications on File Prior to Visit  Medication Sig Dispense Refill  . amLODipine (NORVASC) 5 MG tablet TAKE 1 TABLET BY MOUTH EVERY DAY 30 tablet 11  . cetirizine (ZYRTEC) 10 MG tablet Take 10 mg by mouth daily.    Marland Kitchen levonorgestrel (MIRENA) 20 MCG/24HR IUD 1 each by Intrauterine  route once.     No current facility-administered medications on file prior to visit.    Social History   Tobacco Use  . Smoking status: Never Smoker  . Smokeless tobacco: Never Used  Vaping Use  . Vaping Use: Never used  Substance Use Topics  . Alcohol use: Yes    Alcohol/week: 2.0 standard drinks    Types: 2 Standard drinks or equivalent per week    Comment: occ  . Drug use: No    Review of Systems  Constitutional: Negative for chills and fever.  Respiratory: Negative for cough.   Cardiovascular: Negative for chest pain and palpitations.  Gastrointestinal: Negative for nausea and vomiting.  Musculoskeletal: Positive for arthralgias (knee pain).      Objective:    BP 128/76   Pulse 80   Temp 97.8 F (36.6 C)   Resp 16   Wt 298 lb 9.6 oz (135.4 kg)   SpO2 98%   BMI 49.69 kg/m  BP Readings from Last 3 Encounters:  12/23/19 128/76  07/18/19 115/77  06/24/19 140/80   Wt Readings from Last 3 Encounters:  12/23/19 298 lb 9.6 oz (135.4 kg)  07/18/19 300 lb (136.1 kg)  06/24/19 300 lb (136.1 kg)    Physical Exam Vitals reviewed.  Constitutional:      Appearance: She is well-developed.  Eyes:     Conjunctiva/sclera: Conjunctivae normal.  Cardiovascular:     Rate and Rhythm: Normal rate and regular rhythm.     Pulses: Normal pulses.     Heart sounds: Normal heart sounds.  Pulmonary:     Effort: Pulmonary effort is normal.     Breath sounds: Normal breath sounds. No wheezing, rhonchi or rales.  Skin:    General: Skin is warm and dry.  Neurological:     Mental Status: She is alert.  Psychiatric:        Speech: Speech normal.        Behavior: Behavior normal.        Thought Content: Thought content normal.        Assessment & Plan:   Problem List Items Addressed This Visit      Cardiovascular and Mediastinum   HTN (hypertension)    Elevated today, patient politely declines increasing amlodipine at today's visit.  She would like to focus on weight  loss which I think is appropriate.  She will also be more consistent with amlodipine.  Close follow-up.        Other   Chronic pain of both knees    Chronic, at baseline.  We discussed that weight is likely contributory.  I have given her meloxicam to be used as needed.      Relevant Medications   meloxicam (MOBIC) 7.5 MG tablet   Obesity - Primary    Discussed low glycemic index diet. Trial of saxenda.  Discussed black box warning on for patient today.  She will alert me with any changes in personal family history as it relates to thyroid cancer.      Relevant Medications   Liraglutide -Weight Management (SAXENDA) 18 MG/3ML SOPN       I have discontinued Lerin D. Buschman's diclofenac. I am also having her start on meloxicam and Saxenda. Additionally, I am having her maintain her cetirizine, levonorgestrel, and amLODipine.   Meds ordered this encounter  Medications  . meloxicam (MOBIC) 7.5 MG tablet    Sig: Take 1 tablet (7.5 mg total) by mouth daily as needed for pain.    Dispense:  30 tablet    Refill:  1    Order Specific Question:   Supervising Provider    Answer:   Deborra Medina L [[id number]]  . Liraglutide -Weight Management (SAXENDA) 18 MG/3ML SOPN    Sig: Inject 0.1 mLs (0.6 mg total) into the skin daily.    Dispense:  1 pen    Refill:  3    Order Specific Question:   Supervising Provider    Answer:   Crecencio Mc [[id number]]    Return precautions given.   Risks, benefits, and alternatives of the medications and treatment plan prescribed today were discussed, and patient expressed understanding.   Education regarding symptom management and diagnosis given to patient on AVS.  Continue to follow with Burnard Hawthorne, FNP for routine health maintenance.   Dalyce D Denison and I agreed with plan.   Mable Paris, FNP

## 2019-12-26 ENCOUNTER — Telehealth: Payer: Self-pay | Admitting: Family

## 2019-12-26 DIAGNOSIS — E669 Obesity, unspecified: Secondary | ICD-10-CM

## 2019-12-26 NOTE — Telephone Encounter (Signed)
For your information  

## 2019-12-26 NOTE — Telephone Encounter (Signed)
Pt called to let Joycelyn Schmid know that Liraglutide -Weight Management ([place]) 18 MG/3ML SOPN is not covered with her insurance

## 2019-12-28 MED ORDER — LIRAGLUTIDE 18 MG/3ML ~~LOC~~ SOPN: 0.6000 mg | PEN_INJECTOR | Freq: Every day | SUBCUTANEOUS | 3 refills | Status: DC

## 2019-12-28 NOTE — Telephone Encounter (Signed)
Call pt  I have ordered victoza since saxenda is not covered  Advise her to follow titration instructions for the victoza on new Rx; she does not need to follow titration instructions that I provided on saxenda ; I dced saxenda

## 2019-12-28 NOTE — Telephone Encounter (Signed)
Patient informed and verbalized understanding

## 2019-12-28 NOTE — Addendum Note (Signed)
Addended by: Burnard Hawthorne on: 12/28/2019 08:30 AM   Modules accepted: Orders

## 2020-01-26 IMAGING — MG DIGITAL DIAGNOSTIC UNILATERAL RIGHT MAMMOGRAM WITH TOMO AND CAD
4 series · 4 of 12 positions shown · non-contrast
Comparison: Previous exam(s).

CLINICAL DATA: Patient was called back from screening mammogram for
possible mass in the right breast.

EXAM:
DIGITAL DIAGNOSTIC RIGHT MAMMOGRAM WITH TOMO
ULTRASOUND RIGHT BREAST

[R CC synth-2D]
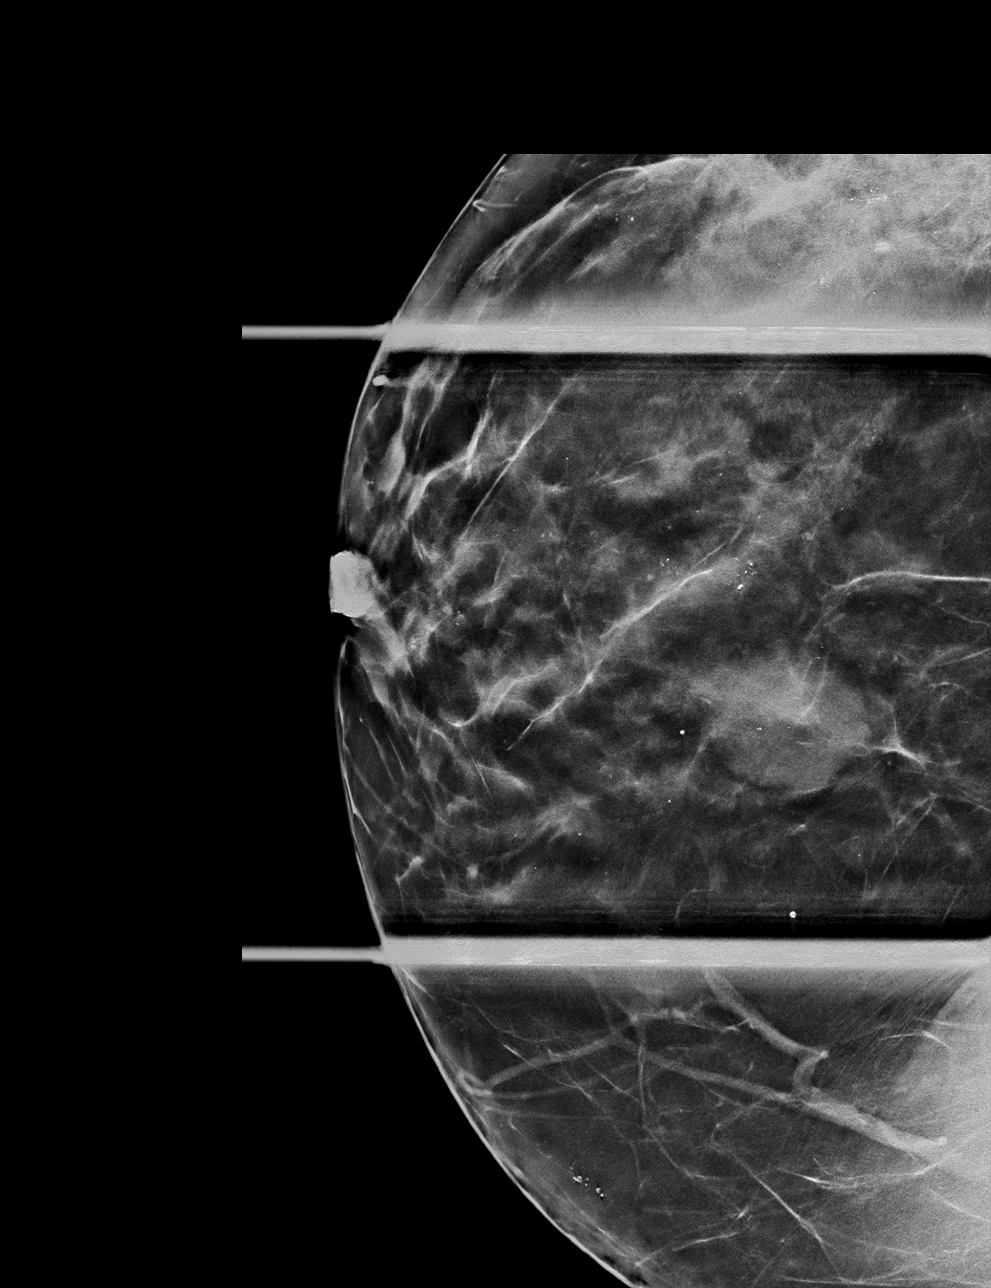

[R MLO synth-2D]
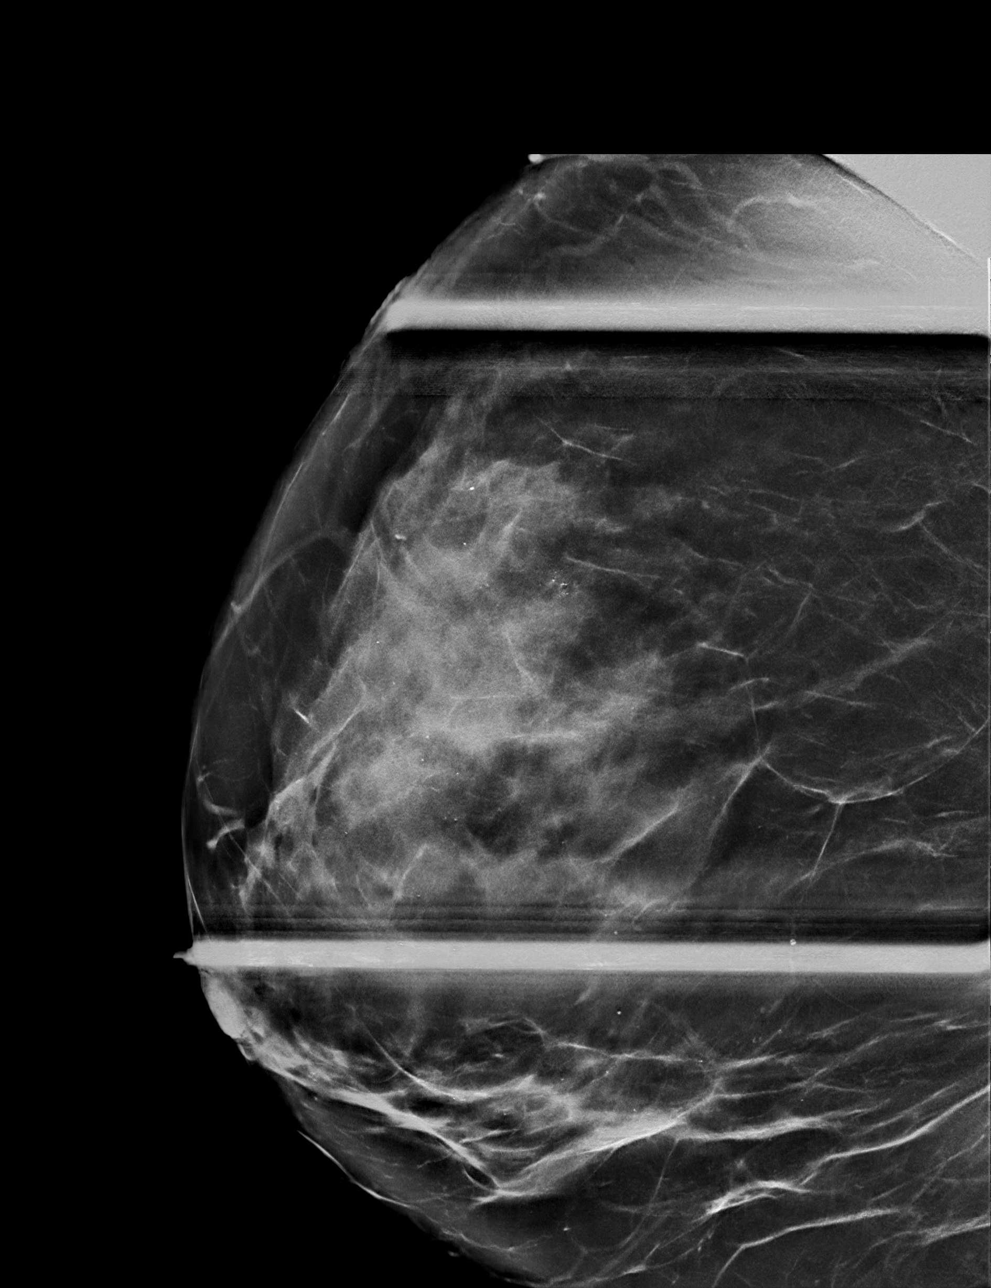

[R CC tomo · tomo slice 35/68.0]
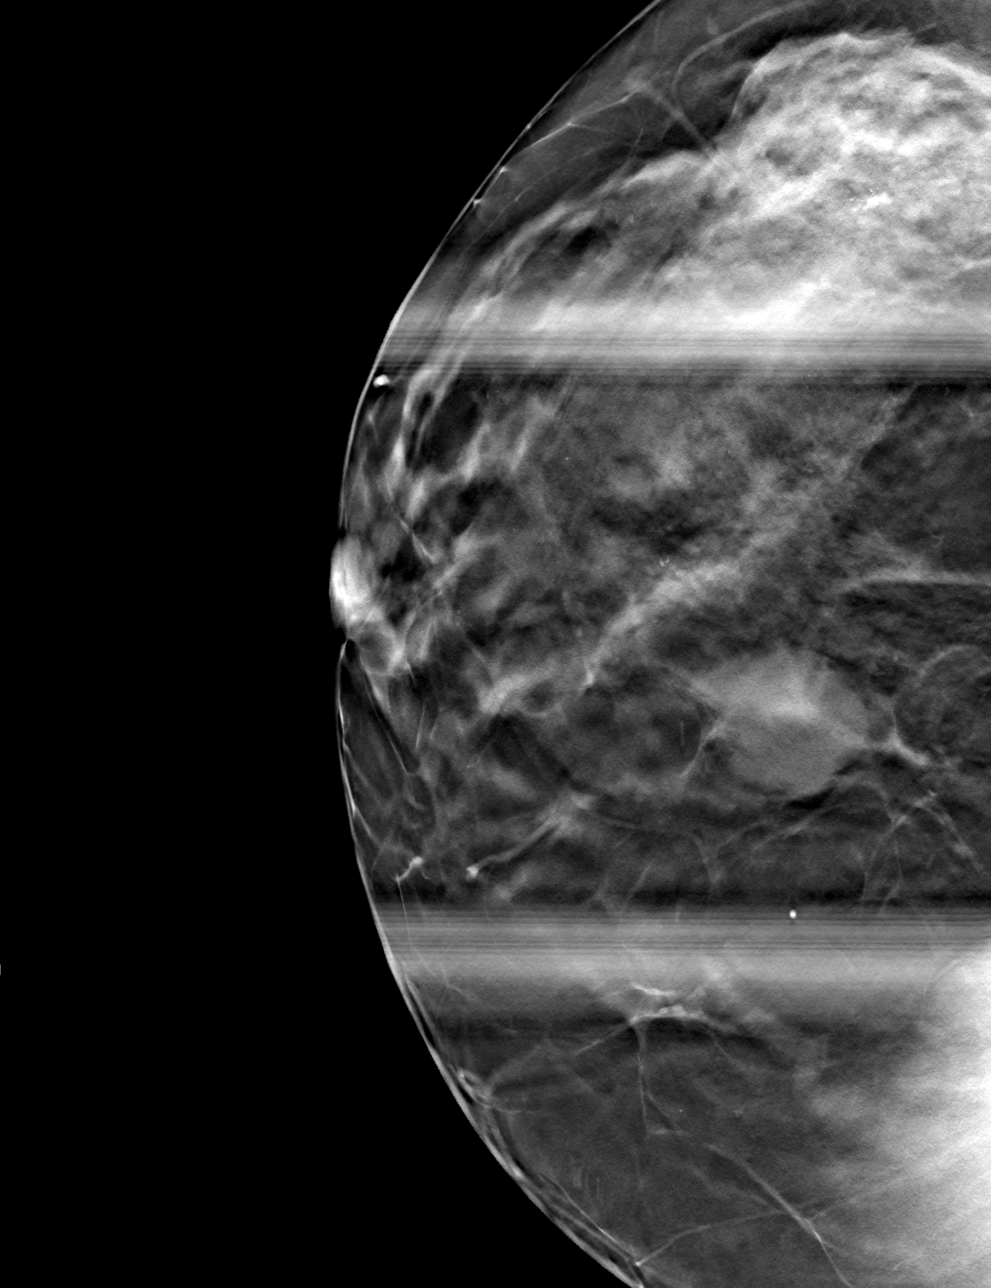

[R MLO tomo · tomo slice 47/92.0]
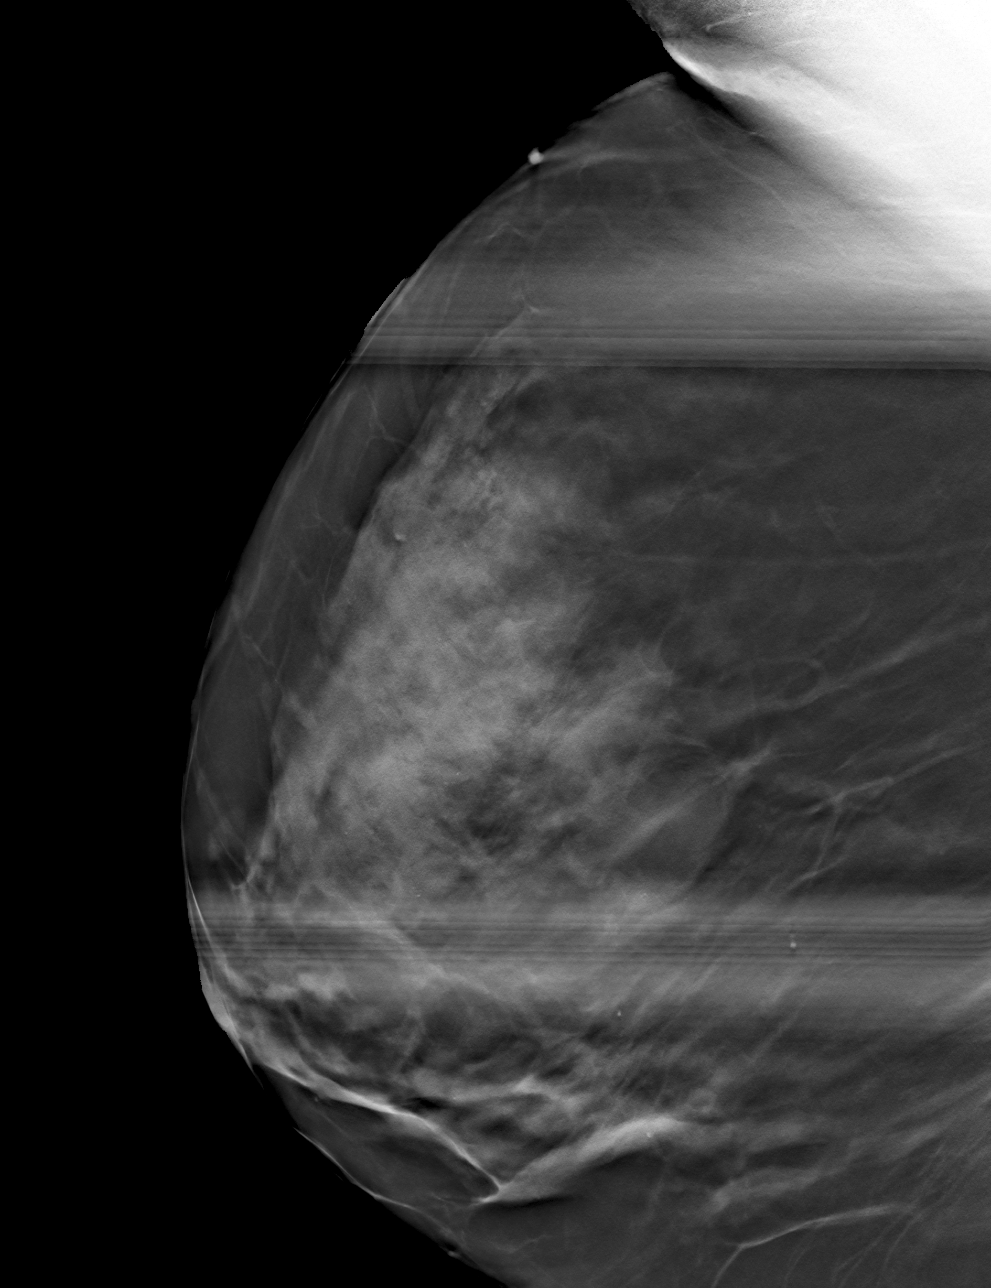

[4 of 12 positions shown; findings below may reference images not displayed]

ACR Breast Density Category c: The breast tissue is heterogeneously
dense, which may obscure small masses.
FINDINGS: Additional imaging of the right breast was performed. There is
persistence of a well-circumscribed 2.5 cm mass in the 12 o'clock
region of the breast.

Targeted ultrasound is performed, showing an anechoic cyst in the
right breast at 12 o'clock 3 cm from the nipple measuring 2.5 x
x 2.2 cm.
IMPRESSION: Right breast cyst.  No evidence of malignancy in the right breast.

RECOMMENDATION:
Bilateral screening mammogram in 1 year is recommended.

I have discussed the findings and recommendations with the patient.
Results were also provided in writing at the conclusion of the
visit. If applicable, a reminder letter will be sent to the patient
regarding the next appointment.

BI-RADS CATEGORY  2: Benign.

## 2020-02-19 ENCOUNTER — Other Ambulatory Visit: Payer: Self-pay | Admitting: Family

## 2020-02-19 DIAGNOSIS — M25562 Pain in left knee: Secondary | ICD-10-CM

## 2020-02-19 DIAGNOSIS — G8929 Other chronic pain: Secondary | ICD-10-CM

## 2020-03-26 ENCOUNTER — Ambulatory Visit: Payer: 59 | Admitting: Family

## 2020-05-03 ENCOUNTER — Other Ambulatory Visit: Payer: Self-pay | Admitting: Family

## 2020-05-03 DIAGNOSIS — G8929 Other chronic pain: Secondary | ICD-10-CM

## 2020-05-03 DIAGNOSIS — M25561 Pain in right knee: Secondary | ICD-10-CM

## 2020-06-26 ENCOUNTER — Other Ambulatory Visit: Payer: Self-pay

## 2020-06-26 ENCOUNTER — Ambulatory Visit (INDEPENDENT_AMBULATORY_CARE_PROVIDER_SITE_OTHER): Payer: 59 | Admitting: Family

## 2020-06-26 ENCOUNTER — Encounter: Payer: Self-pay | Admitting: Family

## 2020-06-26 ENCOUNTER — Other Ambulatory Visit: Payer: Self-pay | Admitting: Family

## 2020-06-26 VITALS — BP 140/76 | HR 87 | Temp 98.0°F | Ht 65.95 in | Wt 291.8 lb

## 2020-06-26 DIAGNOSIS — Z0000 Encounter for general adult medical examination without abnormal findings: Secondary | ICD-10-CM | POA: Diagnosis not present

## 2020-06-26 DIAGNOSIS — I10 Essential (primary) hypertension: Secondary | ICD-10-CM | POA: Diagnosis not present

## 2020-06-26 DIAGNOSIS — Z23 Encounter for immunization: Secondary | ICD-10-CM | POA: Diagnosis not present

## 2020-06-26 DIAGNOSIS — E669 Obesity, unspecified: Secondary | ICD-10-CM

## 2020-06-26 DIAGNOSIS — R030 Elevated blood-pressure reading, without diagnosis of hypertension: Secondary | ICD-10-CM

## 2020-06-26 MED ORDER — AMLODIPINE BESYLATE 10 MG PO TABS: 10.0000 mg | ORAL_TABLET | Freq: Every day | ORAL | 1 refills | Status: DC

## 2020-06-26 MED ORDER — SAXENDA 18 MG/3ML ~~LOC~~ SOPN: 0.6000 mg | PEN_INJECTOR | Freq: Every day | SUBCUTANEOUS | 3 refills | Status: DC

## 2020-06-26 NOTE — Patient Instructions (Signed)
Lets try saxenda to get approved again Start Saxenda 0.6mg  subcutaneously [place]) ; you may take this injection daily and will gradually increase as tolerated to maximum dose of 3mg  injected subcutaneous daily. You do not have to increase each week, you can go weeks without increasing.  If you do increase, you do this by increasing dose 0.6mg  once per week.  For example: Week one : 0.6mg  [place] daily Week two: 1.2mg  [place] daily  Week three: 1.8 mg [place] daily Week four: 2.4mg  [place] daily Week five: 3mg   daily AND STAY THERE.  Remember you may NOT take this medication if ANY personal or family history of thyroid cancer so please let us know asap if this history were to change.    Increase amlodipine to 10mg .  It is imperative that you are seen AT least twice per year for labs and monitoring. Monitor blood pressure at home and me 5-6 reading on separate days. Goal is less than 120/80, based on newest guidelines, however we certainly want to be less than 130/80;  if persistently higher, please make sooner follow up appointment so we can recheck you blood pressure and manage/ adjust medications.  Call [facility] for [place] and Dr Amalia Hailey are great.  Please call  and schedule your 3D mammogram as discussed.   [place], [place]   Health Maintenance, Female Adopting a healthy lifestyle and getting preventive care are important in promoting health and wellness. Ask your health care provider about:  The right schedule for you to have regular tests and exams.  Things you can do on your own to prevent diseases and keep yourself healthy. What should I know about diet, weight, and exercise? Eat a healthy diet   Eat a diet that includes plenty of vegetables, fruits, low-fat dairy products, and lean protein.  Do not eat a lot of foods that are high in solid fats, added sugars, or sodium. Maintain a healthy  weight Body mass index (BMI) is used to identify weight problems. It estimates body fat based on height and weight. Your health care provider can help determine your BMI and help you achieve or maintain a healthy weight. Get regular exercise Get regular exercise. This is one of the most important things you can do for your health. Most adults should:  Exercise for at least 150 minutes each week. The exercise should increase your heart rate and make you sweat (moderate-intensity exercise).  Do strengthening exercises at least twice a week. This is in addition to the moderate-intensity exercise.  Spend less time sitting. Even light physical activity can be beneficial. Watch cholesterol and blood lipids Have your blood tested for lipids and cholesterol at 51 years of age, then have this test every 5 years. Have your cholesterol levels checked more often if:  Your lipid or cholesterol levels are high.  You are older than 51 years of age.  You are at high risk for heart disease. What should I know about cancer screening? Depending on your health history and family history, you may need to have cancer screening at various ages. This may include screening for:  Breast cancer.  Cervical cancer.  Colorectal cancer.  Skin cancer.  Lung cancer. What should I know about heart disease, diabetes, and high blood pressure? Blood pressure and heart disease  High blood pressure causes heart disease and increases the risk of stroke. This is more likely to develop in  people who have high blood pressure readings, are of African descent, or are overweight.  Have your blood pressure checked: ? Every 3-5 years if you are 8-57 years of age. ? Every year if you are 14 years old or older. Diabetes Have regular diabetes screenings. This checks your fasting blood sugar level. Have the screening done:  Once every three years after age 8 if you are at a normal weight and have a low risk for  diabetes.  More often and at a younger age if you are overweight or have a high risk for diabetes. What should I know about preventing infection? Hepatitis B If you have a higher risk for hepatitis B, you should be screened for this virus. Talk with your health care provider to find out if you are at risk for hepatitis B infection. Hepatitis C Testing is recommended for:  Everyone born from [birth year] through 1967.  Anyone with known risk factors for hepatitis C. Sexually transmitted infections (STIs)  Get screened for STIs, including gonorrhea and chlamydia, if: ? You are sexually active and are younger than 51 years of age. ? You are older than 51 years of age and your health care provider tells you that you are at risk for this type of infection. ? Your sexual activity has changed since you were last screened, and you are at increased risk for chlamydia or gonorrhea. Ask your health care provider if you are at risk.  Ask your health care provider about whether you are at high risk for HIV. Your health care provider may recommend a prescription medicine to help prevent HIV infection. If you choose to take medicine to prevent HIV, you should first get tested for HIV. You should then be tested every 3 months for as long as you are taking the medicine. Pregnancy  If you are about to stop having your period (premenopausal) and you may become pregnant, seek counseling before you get pregnant.  Take 400 to 800 micrograms (mcg) of folic acid every day if you become pregnant.  Ask for birth control (contraception) if you want to prevent pregnancy. Osteoporosis and menopause Osteoporosis is a disease in which the bones lose minerals and strength with aging. This can result in bone fractures. If you are 42 years old or older, or if you are at risk for osteoporosis and fractures, ask your health care provider if you should:  Be screened for bone loss.  Take a calcium or vitamin D supplement to lower  your risk of fractures.  Be given hormone replacement therapy (HRT) to treat symptoms of menopause. Follow these instructions at home: Lifestyle  Do not use any products that contain nicotine or tobacco, such as cigarettes, e-cigarettes, and chewing tobacco. If you need help quitting, ask your health care provider.  Do not use street drugs.  Do not share needles.  Ask your health care provider for help if you need support or information about quitting drugs. Alcohol use  Do not drink alcohol if: ? Your health care provider tells you not to drink. ? You are pregnant, may be pregnant, or are planning to become pregnant.  If you drink alcohol: ? Limit how much you use to 0-1 drink a day. ? Limit intake if you are breastfeeding.  Be aware of how much alcohol is in your drink. In the [place] one drink equals one 12 oz bottle of beer (355 mL), one 5 oz glass of wine (148 mL), or one 1 oz glass of hard  liquor (44 mL). General instructions  Schedule regular health, dental, and eye exams.  Stay current with your vaccines.  Tell your health care provider if: ? You often feel depressed. ? You have ever been abused or do not feel safe at home. Summary  Adopting a healthy lifestyle and getting preventive care are important in promoting health and wellness.  Follow your health care provider's instructions about healthy diet, exercising, and getting tested or screened for diseases.  Follow your health care provider's instructions on monitoring your cholesterol and blood pressure. This information is not intended to replace advice given to you by your health care provider. Make sure you discuss any questions you have with your health care provider. Document Revised: 06/16/2018 Document Reviewed: 06/16/2018 Elsevier Patient Education  2020 [employer].

## 2020-06-26 NOTE — Assessment & Plan Note (Signed)
Trial of saxenda. She is aware of black box warning. Education provided on how to titrate

## 2020-06-26 NOTE — Progress Notes (Signed)
Subjective:    Patient ID: Lorraine Holmes, female    DOB: 1971-04-02, 49 y.o.   MRN: [id number]  CC: Lorraine Holmes is a 49 y.o. female who presents today for physical exam.    HPI: Feels well today No new complaints  HTN- at home BP 135/80.compliant with norvasc 5mg . No cp, sob, leg swelling.   Unable to start victoza due to insurance. She  Tried wellbutrin and didn't help with weight loss. No personal or family h/o thyroid or parathyroid cancer.    Chronic knee pain- mobic 7.5mg  prn.   Colorectal Cancer Screening: UTD ,Dr Marius Ditch , repeat in 7 years.  Breast Cancer Screening: Mammogram UTD Cervical Cancer Screening: Follows with GYN.          Tetanus - utd        Hepatitis C screening - Candidate for, consents Labs: Screening labs today. Exercise: Gets regular exercise by walking, daily for 15 minutes.   Alcohol use:  occassional Smoking/tobacco use: Nonsmoker.     HISTORY:  Past Medical History:  Diagnosis Date  . Anemia   . Arthritis    Bilateral knee  . History of chicken pox     Past Surgical History:  Procedure Laterality Date  . COLONOSCOPY WITH PROPOFOL N/A 07/18/2019   Procedure: COLONOSCOPY WITH PROPOFOL;  Surgeon: Lin Landsman, MD;  Location: [facility];  Service: Gastroenterology;  Laterality: N/A;  . FOOT FRACTURE SURGERY Left    2002  . JOINT REPLACEMENT    . KNEE ARTHROSCOPY WITH MEDIAL MENISECTOMY Left 11/28/2014   Procedure: KNEE ARTHROSCOPY WITH MEDIAL MENISECTOMY;  Surgeon: Hessie Knows, MD;  Location: [facility] ORS;  Service: Orthopedics;  Laterality: Left;  Marland Kitchen MEDIAL PARTIAL KNEE REPLACEMENT Right 2013  . meniscus tear Right 2012   Family History  Problem Relation Age of Onset  . Hypertension Mother   . Diabetes Mother   . Hypertension Father   . Heart disease Father 45       CAD - died MI  . Diabetes Maternal Grandmother   . Colon cancer Neg Hx   . Breast cancer Neg Hx   . Thyroid cancer Neg Hx       ALLERGIES: Patient has no known  allergies.  Current Outpatient Medications on File Prior to Visit  Medication Sig Dispense Refill  . cetirizine (ZYRTEC) 10 MG tablet Take 10 mg by mouth daily.    Marland Kitchen levonorgestrel (MIRENA) 20 MCG/24HR IUD 1 each by Intrauterine route once.    . meloxicam (MOBIC) 7.5 MG tablet TAKE 1 TABLET BY MOUTH DAILY AS NEEDED FOR PAIN 30 tablet 1   No current facility-administered medications on file prior to visit.    Social History   Tobacco Use  . Smoking status: Never Smoker  . Smokeless tobacco: Never Used  Vaping Use  . Vaping Use: Never used  Substance Use Topics  . Alcohol use: Yes    Alcohol/week: 2.0 standard drinks    Types: 2 Standard drinks or equivalent per week    Comment: occ  . Drug use: No    Review of Systems  Constitutional: Negative for chills, fever and unexpected weight change.  HENT: Negative for congestion and trouble swallowing.   Respiratory: Negative for cough.   Cardiovascular: Negative for chest pain, palpitations and leg swelling.  Gastrointestinal: Negative for nausea and vomiting.  Musculoskeletal: Negative for arthralgias and myalgias.  Skin: Negative for rash.  Neurological: Negative for headaches.  Hematological: Negative for adenopathy.  Psychiatric/Behavioral: Negative  for confusion.      Objective:    BP 140/76   Pulse 87   Temp 98 F (36.7 C)   Ht 5' 5.95" (1.675 m)   Wt 291 lb 12.8 oz (132.4 kg)   SpO2 99%   BMI 47.17 kg/m   BP Readings from Last 3 Encounters:  06/26/20 140/76  12/23/19 128/76  07/18/19 115/77   Wt Readings from Last 3 Encounters:  06/26/20 291 lb 12.8 oz (132.4 kg)  12/23/19 298 lb 9.6 oz (135.4 kg)  07/18/19 300 lb (136.1 kg)    Physical Exam Vitals reviewed.  Constitutional:      Appearance: She is well-developed and well-nourished.  Eyes:     Conjunctiva/sclera: Conjunctivae normal.  Neck:     Thyroid: No thyroid mass or thyromegaly.  Cardiovascular:     Rate and Rhythm: Normal rate and regular  rhythm.     Pulses: Normal pulses.     Heart sounds: Normal heart sounds.  Pulmonary:     Effort: Pulmonary effort is normal.     Breath sounds: Normal breath sounds. No wheezing, rhonchi or rales.     Comments: CBE performed.  Chest:  Breasts: Breasts are symmetrical.     Right: No inverted nipple, mass, nipple discharge, skin change or tenderness.     Left: No inverted nipple, mass, nipple discharge, skin change or tenderness.    Lymphadenopathy:     Head:     Right side of head: No submental, submandibular, tonsillar, preauricular, posterior auricular or occipital adenopathy.     Left side of head: No submental, submandibular, tonsillar, preauricular, posterior auricular or occipital adenopathy.     Cervical: No cervical adenopathy.     Right cervical: No superficial, deep or posterior cervical adenopathy.    Left cervical: No superficial, deep or posterior cervical adenopathy.     Upper Body:  No axillary adenopathy present. Skin:    General: Skin is warm and dry.  Neurological:     Mental Status: She is alert.  Psychiatric:        Mood and Affect: Mood and affect normal.        Speech: Speech normal.        Behavior: Behavior normal.        Thought Content: Thought content normal.        Assessment & Plan:   Problem List Items Addressed This Visit      Cardiovascular and Mediastinum   HTN (hypertension)    Uncontrolled. Increase amlodipine to 10mg       Relevant Medications   amLODipine (NORVASC) 10 MG tablet     Other   Obesity    Trial of saxenda. She is aware of black box warning. Education provided on how to titrate      Relevant Medications   Liraglutide -Weight Management (SAXENDA) 18 MG/3ML SOPN   Routine general medical examination at a health care facility    CBE performed. Mammogram ordered and patient will schedule. Encouraged continued walking.       Relevant Medications   amLODipine (NORVASC) 10 MG tablet   Other Relevant Orders   MM 3D  SCREEN BREAST BILATERAL   Hepatitis C antibody   CBC with Differential/Platelet   Comprehensive metabolic panel   Hemoglobin A1c   Lipid panel   TSH   VITAMIN D 25 Hydroxy (Vit-D Deficiency, Fractures)    Other Visit Diagnoses    Need for immunization against influenza    -  Primary  Relevant Orders   Flu Vaccine QUAD 36+ mos IM (Completed)   Elevated blood pressure reading       Relevant Medications   amLODipine (NORVASC) 10 MG tablet       I have discontinued Canna D. Tye's liraglutide. I have also changed her amLODipine. Additionally, I am having her start on Saxenda. Lastly, I am having her maintain her cetirizine, levonorgestrel, and meloxicam.   Meds ordered this encounter  Medications  . amLODipine (NORVASC) 10 MG tablet    Sig: Take 1 tablet (10 mg total) by mouth daily.    Dispense:  90 tablet    Refill:  1    Order Specific Question:   Supervising Provider    Answer:   Deborra Medina L [[id number]]  . Liraglutide -Weight Management (SAXENDA) 18 MG/3ML SOPN    Sig: Inject 0.6 mg into the skin daily.    Dispense:  3 mL    Refill:  3    Order Specific Question:   Supervising Provider    Answer:   Crecencio Mc [[id number]]    Return precautions given.   Risks, benefits, and alternatives of the medications and treatment plan prescribed today were discussed, and patient expressed understanding.   Education regarding symptom management and diagnosis given to patient on AVS.   Continue to follow with Burnard Hawthorne, FNP for routine health maintenance.   Eden D Sine and I agreed with plan.   Lorraine Paris, FNP

## 2020-06-26 NOTE — Assessment & Plan Note (Signed)
CBE performed. Mammogram ordered and patient will schedule. Encouraged continued walking.

## 2020-06-26 NOTE — Assessment & Plan Note (Signed)
Uncontrolled. Increase amlodipine to 10mg 

## 2020-06-27 ENCOUNTER — Other Ambulatory Visit (INDEPENDENT_AMBULATORY_CARE_PROVIDER_SITE_OTHER): Payer: 59

## 2020-06-27 DIAGNOSIS — Z0000 Encounter for general adult medical examination without abnormal findings: Secondary | ICD-10-CM

## 2020-06-27 LAB — CBC WITH DIFFERENTIAL/PLATELET
Basophils Absolute: 0.1 10*3/uL (ref 0.0–0.1)
Basophils Relative: 0.6 % (ref 0.0–3.0)
Eosinophils Absolute: 0.1 10*3/uL (ref 0.0–0.7)
Eosinophils Relative: 1.1 % (ref 0.0–5.0)
HCT: 42.2 % (ref 36.0–46.0)
Hemoglobin: 13.5 g/dL (ref 12.0–15.0)
Lymphocytes Relative: 18.3 % (ref 12.0–46.0)
Lymphs Abs: 1.9 10*3/uL (ref 0.7–4.0)
MCHC: 32.1 g/dL (ref 30.0–36.0)
MCV: 87.2 fl (ref 78.0–100.0)
Monocytes Absolute: 0.8 10*3/uL (ref 0.1–1.0)
Monocytes Relative: 7.9 % (ref 3.0–12.0)
Neutro Abs: 7.5 10*3/uL (ref 1.4–7.7)
Neutrophils Relative %: 72.1 % (ref 43.0–77.0)
Platelets: 315 10*3/uL (ref 150.0–400.0)
RBC: 4.84 Mil/uL (ref 3.87–5.11)
RDW: 15 % (ref 11.5–15.5)
WBC: 10.4 10*3/uL (ref 4.0–10.5)

## 2020-06-27 LAB — VITAMIN D 25 HYDROXY (VIT D DEFICIENCY, FRACTURES): VITD: 33.38 ng/mL (ref 30.00–100.00)

## 2020-06-27 LAB — COMPREHENSIVE METABOLIC PANEL
ALT: 8 U/L (ref 0–35)
AST: 13 U/L (ref 0–37)
Albumin: 4.2 g/dL (ref 3.5–5.2)
Alkaline Phosphatase: 88 U/L (ref 39–117)
BUN: 10 mg/dL (ref 6–23)
CO2: 29 mEq/L (ref 19–32)
Calcium: 9.9 mg/dL (ref 8.4–10.5)
Chloride: 103 mEq/L (ref 96–112)
Creatinine, Ser: 0.81 mg/dL (ref 0.40–1.20)
GFR: 83.95 mL/min (ref >60.00)
Glucose, Bld: 99 mg/dL (ref 70–99)
Potassium: 4.2 mEq/L (ref 3.5–5.1)
Sodium: 137 mEq/L (ref 135–145)
Total Bilirubin: 0.6 mg/dL (ref 0.2–1.2)
Total Protein: 7.5 g/dL (ref 6.0–8.3)

## 2020-06-27 LAB — HEMOGLOBIN A1C: Hgb A1c MFr Bld: 5.6 % (ref 4.6–6.5)

## 2020-06-27 LAB — LIPID PANEL
Cholesterol: 149 mg/dL (ref 0–200)
HDL: 47.4 mg/dL (ref >39.00)
LDL Cholesterol: 93 mg/dL (ref 0–99)
NonHDL: 101.7
Total CHOL/HDL Ratio: 3
Triglycerides: 42 mg/dL (ref 0.0–149.0)
VLDL: 8.4 mg/dL (ref 0.0–40.0)

## 2020-06-27 LAB — TSH: TSH: 1.07 u[IU]/mL (ref 0.35–4.50)

## 2020-06-28 LAB — HEPATITIS C ANTIBODY
Hepatitis C Ab: NONREACTIVE
SIGNAL TO CUT-OFF: 0.05 (ref <1.00)

## 2020-07-04 ENCOUNTER — Other Ambulatory Visit: Payer: Self-pay | Admitting: Family

## 2020-07-04 ENCOUNTER — Telehealth: Payer: Self-pay | Admitting: Family

## 2020-07-04 DIAGNOSIS — E669 Obesity, unspecified: Secondary | ICD-10-CM

## 2020-07-04 MED ORDER — LIRAGLUTIDE 18 MG/3ML ~~LOC~~ SOPN: PEN_INJECTOR | SUBCUTANEOUS | 12 refills | Status: DC

## 2020-07-04 NOTE — Telephone Encounter (Signed)
I called patient to advise on below. Pt verbalized understanding & will let us know if any issues getting filled.

## 2020-07-04 NOTE — Telephone Encounter (Signed)
Call pt saxenda doesn't appear approved due to message we got from pharmacy I have sent in victoza which is same medication, different brand name   remember black box warning that you may NOT take medication if you or a family member is diagnosed with thyroid cancer.    You may increase by 0.6 mg/day once a week. For instance, first week, take 0.6mg  [place] daily. Second week take 1.2 mg [place] daily. Third week take 1.8 mg [place] daily which is max.   Remember the goal of weight loss is 1 to 2 pounds maximum per week. Its VERY reasonable to stay on a dose for a couple of weeks ( or more) prior to increasing. We dont want to increase too fast.

## 2020-07-18 ENCOUNTER — Encounter: Payer: Self-pay | Admitting: Family

## 2020-08-30 ENCOUNTER — Ambulatory Visit
Admission: RE | Admit: 2020-08-30 | Discharge: 2020-08-30 | Disposition: A | Discharge status: Home / Self Care | Payer: 59 | Source: Ambulatory Visit | Attending: Family | Admitting: Family

## 2020-08-30 ENCOUNTER — Other Ambulatory Visit: Payer: Self-pay

## 2020-08-30 DIAGNOSIS — R921 Mammographic calcification found on diagnostic imaging of breast: Secondary | ICD-10-CM | POA: Diagnosis not present

## 2020-08-30 DIAGNOSIS — Z0000 Encounter for general adult medical examination without abnormal findings: Secondary | ICD-10-CM

## 2020-08-30 DIAGNOSIS — Z1231 Encounter for screening mammogram for malignant neoplasm of breast: Secondary | ICD-10-CM | POA: Diagnosis present

## 2020-09-03 ENCOUNTER — Other Ambulatory Visit: Payer: Self-pay | Admitting: Family

## 2020-09-03 DIAGNOSIS — R921 Mammographic calcification found on diagnostic imaging of breast: Secondary | ICD-10-CM

## 2020-09-03 DIAGNOSIS — R928 Other abnormal and inconclusive findings on diagnostic imaging of breast: Secondary | ICD-10-CM

## 2020-09-14 ENCOUNTER — Ambulatory Visit
Admission: RE | Admit: 2020-09-14 | Discharge: 2020-09-14 | Disposition: A | Discharge status: Home / Self Care | Payer: 59 | Source: Ambulatory Visit | Attending: Family | Admitting: Family

## 2020-09-14 ENCOUNTER — Other Ambulatory Visit: Payer: Self-pay

## 2020-09-14 DIAGNOSIS — R921 Mammographic calcification found on diagnostic imaging of breast: Secondary | ICD-10-CM | POA: Diagnosis present

## 2020-09-14 DIAGNOSIS — R928 Other abnormal and inconclusive findings on diagnostic imaging of breast: Secondary | ICD-10-CM | POA: Insufficient documentation

## 2020-09-17 ENCOUNTER — Telehealth: Payer: Self-pay

## 2020-09-17 ENCOUNTER — Other Ambulatory Visit: Payer: Self-pay | Admitting: Family

## 2020-09-17 DIAGNOSIS — R928 Other abnormal and inconclusive findings on diagnostic imaging of breast: Secondary | ICD-10-CM

## 2020-09-17 DIAGNOSIS — R921 Mammographic calcification found on diagnostic imaging of breast: Secondary | ICD-10-CM

## 2020-09-17 NOTE — Telephone Encounter (Signed)
LMTCB for mammogram results.  

## 2020-09-27 ENCOUNTER — Other Ambulatory Visit: Payer: Self-pay

## 2020-09-27 ENCOUNTER — Ambulatory Visit
Admission: RE | Admit: 2020-09-27 | Discharge: 2020-09-27 | Disposition: A | Discharge status: Home / Self Care | Payer: 59 | Source: Ambulatory Visit | Attending: Family | Admitting: Family

## 2020-09-27 DIAGNOSIS — R921 Mammographic calcification found on diagnostic imaging of breast: Secondary | ICD-10-CM | POA: Diagnosis not present

## 2020-09-27 DIAGNOSIS — R928 Other abnormal and inconclusive findings on diagnostic imaging of breast: Secondary | ICD-10-CM | POA: Insufficient documentation

## 2020-09-27 HISTORY — PX: BREAST BIOPSY: SHX20

## 2020-09-28 LAB — SURGICAL PATHOLOGY

## 2020-09-30 DIAGNOSIS — D0511 Intraductal carcinoma in situ of right breast: Secondary | ICD-10-CM

## 2020-09-30 NOTE — Progress Notes (Signed)
Navigation initiated. Will schedule  Med/Onc and surgical consult on 3/28.

## 2020-10-01 NOTE — Progress Notes (Signed)
Scheduled Surgical consult with Dr. Christian Mate, and Med/Onc consult with Dr. Tasia Catchings.  Explained physicians would review imaging, and pathology to determine her best treatment plan.

## 2020-10-02 ENCOUNTER — Telehealth: Payer: Self-pay | Admitting: Family

## 2020-10-02 NOTE — Telephone Encounter (Signed)
Spoke with pt regarding new right breast cancer diagnosis. She is coping well and in good spirits appts scheduled with surgery and oncology She will me if she needs anything

## 2020-10-04 ENCOUNTER — Ambulatory Visit: Payer: Self-pay | Admitting: Surgery

## 2020-10-04 ENCOUNTER — Other Ambulatory Visit: Payer: Self-pay

## 2020-10-04 ENCOUNTER — Encounter: Payer: Self-pay | Admitting: Surgery

## 2020-10-04 ENCOUNTER — Ambulatory Visit (INDEPENDENT_AMBULATORY_CARE_PROVIDER_SITE_OTHER): Payer: 59 | Admitting: Surgery

## 2020-10-04 VITALS — BP 158/89 | HR 97 | Temp 98.7°F | Ht 65.75 in | Wt 287.2 lb

## 2020-10-04 DIAGNOSIS — D0511 Intraductal carcinoma in situ of right breast: Secondary | ICD-10-CM | POA: Diagnosis not present

## 2020-10-04 NOTE — Progress Notes (Signed)
Patient ID: [facility], female   DOB: [birth year], 50 y.o.   MRN: [id number]  Chief Complaint: DCIS right breast.  History of Present Illness Lorraine Holmes is a 50 y.o. female with a screening mammogram showing a tightly clustered area of suspicious calcifications.  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  Prognostic indicators are pending.  It seemed to be the only area of concern, subcentimeter cluster of calcifications.   Of note she has utilized [employer] for 9 years, and just had a recent reimplantation.  She is premenopausal she has no family history of breast cancer.  She is pregnant twice and was 50 years old when her first child delivered.  She does perform monthly exams.  And has no known breast changes or issues.  Apparently had one cyst aspirated 1998.  Past Medical History Past Medical History:  Diagnosis Date  . Anemia   . Arthritis    Bilateral knee  . History of chicken pox       Past Surgical History:  Procedure Laterality Date  . BREAST BIOPSY Right 09/27/2020   stereo bx, ribbon clip, path pending   . COLONOSCOPY WITH PROPOFOL N/A 07/18/2019   Procedure: COLONOSCOPY WITH PROPOFOL;  Surgeon: Lin Landsman, MD;  Location: [facility] ENDOSCOPY;  Service: Gastroenterology;  Laterality: N/A;  . FOOT FRACTURE SURGERY Left    2002  . JOINT REPLACEMENT    . KNEE ARTHROSCOPY WITH MEDIAL MENISECTOMY Left 11/28/2014   Procedure: KNEE ARTHROSCOPY WITH MEDIAL MENISECTOMY;  Surgeon: Hessie Knows, MD;  Location: [facility] ORS;  Service: Orthopedics;  Laterality: Left;  Marland Kitchen MEDIAL PARTIAL KNEE REPLACEMENT Right 2013  . meniscus tear Right 2012    No Known Allergies  Current Outpatient Medications  Medication Sig Dispense Refill  . amLODipine (NORVASC) 10 MG tablet Take 1 tablet (10 mg total) by mouth daily. 90 tablet 1  . cetirizine (ZYRTEC) 10 MG tablet Take 10 mg by mouth daily.    Marland Kitchen levonorgestrel (MIRENA) 20 MCG/24HR IUD 1 each by  Intrauterine route once.    . meloxicam (MOBIC) 7.5 MG tablet TAKE 1 TABLET BY MOUTH DAILY AS NEEDED FOR PAIN 30 tablet 1   No current facility-administered medications for this visit.    Family History Family History  Problem Relation Age of Onset  . Hypertension Mother   . Diabetes Mother   . Hypertension Father   . Heart disease Father 18       CAD - died MI  . Diabetes Maternal Grandmother   . Colon cancer Neg Hx   . Breast cancer Neg Hx   . Thyroid cancer Neg Hx       Social History Social History   Tobacco Use  . Smoking status: Never Smoker  . Smokeless tobacco: Never Used  Vaping Use  . Vaping Use: Never used  Substance Use Topics  . Alcohol use: Yes    Alcohol/week: 2.0 standard drinks    Types: 2 Standard drinks or equivalent per week    Comment: occ  . Drug use: No        Review of Systems  Constitutional: Negative.   HENT: Negative.   Eyes: Negative.   Respiratory: Negative.   Cardiovascular: Negative.   Gastrointestinal: Negative.   Genitourinary: Negative.   Skin: Negative.   Neurological: Negative.   Psychiatric/Behavioral: Negative.       Physical Exam Blood pressure (!) 158/89, pulse 97, temperature 98.7 F (37.1 C), temperature source Oral,  height 5' 5.75" (1.67 m), weight 287 lb 3.2 oz (130.3 kg), SpO2 97 %. Last Weight  Most recent update: 10/04/2020  2:58 PM   Weight  130.3 kg (287 lb 3.2 oz)            CONSTITUTIONAL: Well developed, and nourished, appropriately responsive and aware without distress.   EYES: Sclera non-icteric.   EARS, NOSE, MOUTH AND THROAT: Mask worn.    Hearing is intact to voice.  NECK: Trachea is midline, and there is no jugular venous distension.  LYMPH NODES:  Lymph nodes in the neck are not enlarged. RESPIRATORY:  Lungs are clear, and breath sounds are equal bilaterally. Normal respiratory effort without pathologic use of accessory muscles. CARDIOVASCULAR: Heart is regular in rate and rhythm. GI: The  abdomen is  soft, nontender, and nondistended.  GU: She reports she is a 36F cup.  No palpable masses.  Levada Dy present as a Producer, television/film/video. MUSCULOSKELETAL:  Symmetrical muscle tone appreciated in all four extremities.    SKIN: Skin turgor is normal. No pathologic skin lesions appreciated.  NEUROLOGIC:  Motor and sensation appear grossly normal.  Cranial nerves are grossly without defect. PSYCH:  Alert and oriented to person, place and time. Affect is appropriate for situation.  Data Reviewed I have personally reviewed what is currently available of the patient's imaging, recent labs and medical records.   Labs:  CBC Latest Ref Rng & Units 06/27/2020 07/05/2019 03/24/2018  WBC 4.0 - 10.5 K/uL 10.4 8.4 8.6  Hemoglobin 12.0 - 15.0 g/dL 13.5 13.1 12.7  Hematocrit 36.0 - 46.0 % 42.2 41.1 39.1  Platelets 150.0 - 400.0 K/uL 315.0 257.0 291.0   CMP Latest Ref Rng & Units 06/27/2020 07/05/2019 03/24/2018  Glucose 70 - 99 mg/dL 99 100(H) 98  BUN 6 - 23 mg/dL 10 13 12   Creatinine 0.40 - 1.20 mg/dL 0.81 0.73 0.66  Sodium 135 - 145 mEq/L 137 137 140  Potassium 3.5 - 5.1 mEq/L 4.2 4.2 4.3  Chloride 96 - 112 mEq/L 103 104 106  CO2 19 - 32 mEq/L 29 25 29   Calcium 8.4 - 10.5 mg/dL 9.9 9.9 9.6  Total Protein 6.0 - 8.3 g/dL 7.5 7.9 7.3  Total Bilirubin 0.2 - 1.2 mg/dL 0.6 0.4 0.4  Alkaline Phos 39 - 117 U/L 88 89 77  AST 0 - 37 U/L 13 17 17   ALT 0 - 35 U/L 8 13 14    SURGICAL PATHOLOGY  CASE: [id number]  PATIENT: [name]  Surgical Pathology Report      Specimen Submitted:  A. Breast, right   Clinical History: 50 year old female with 0.8 cm group of upper outer  right breast calcifications. Ribbon clip placed       DIAGNOSIS:  A. BREAST WITH CALCIFICATIONS, RIGHT UPPER OUTER; STEREOTACTIC BIOPSY:  - HIGH-GRADE DUCTAL CARCINOMA IN SITU (DCIS), COMEDO TYPE, INVOLVING  SCLEROSING ADENOSIS.  - LOBULAR NEOPLASIA AND COLUMNAR CELL CHANGE.  - CALCIFICATIONS ASSOCIATED WITH DCIS AND COLUMNAR  CELL CHANGE.   Comment:  DCIS is present in 2 of 3 blocks and the largest linear focus measures 5  mm. Immunohistochemical staining for ER is deferred to an excision  specimen.    Imaging: Radiology review:   CLINICAL DATA:  50 year old female for further evaluation of RIGHT breast calcifications identified on screening exam.  EXAM: DIGITAL DIAGNOSTIC UNILATERAL RIGHT MAMMOGRAM  TECHNIQUE: Right digital diagnostic mammography was performed. Mammographic images were processed with CAD.  COMPARISON:  Previous exam(s).  ACR Breast Density Category c:  The breast tissue is heterogeneously dense, which may obscure small masses.  FINDINGS: Full field and magnification views of the RIGHT breast demonstrate a 0.8 cm tight group of heterogeneous calcifications within the UPPER OUTER RIGHT breast, middle depth.  Benign milk of calcium is also noted scattered throughout the UPPER OUTER RIGHT breast.  IMPRESSION: 0.8 cm group of indeterminate calcifications within the UPPER-OUTER RIGHT breast. Tissue sampling is recommended.  RECOMMENDATION: 3D/stereotactic guided RIGHT breast biopsy.  I have discussed the findings and recommendations with the patient. If applicable, a reminder letter will be sent to the patient regarding the next appointment.  BI-RADS CATEGORY  4: Suspicious.   Electronically Signed   By: Margarette Canada M.D.   On: 09/14/2020 11:07 Within last 24 hrs: No results found.  Assessment    Subcentimeter area of DCIS right breast, high-grade comedo type. There are no prognostic indicators pending at this time.  Patient Active Problem List   Diagnosis Date Noted  . Chronic pain of both knees 03/24/2018  . Obesity 02/26/2017  . HTN (hypertension) 02/26/2017  . Routine general medical examination at a health care facility 12/21/2013    Plan    RF tag right breast lumpectomy. At that time we will obtain prognostic indicators and have further  discussion regarding her current use of Mirena.  We discussed its likely warranted to go ahead and discontinue Mirena, however she wondered what her alternative would be.  We discussed the role of radiation treatment to the same breast to reduce her risk of long-term.   We also duct discussed the role of estrogen blockade in the long-term role of diminishing her risk of in breast recurrence.  Face-to-face time spent with the patient and accompanying care providers(if present) was 45 minutes, with more than 50% of the time spent counseling, educating, and coordinating care of the patient.      Ronny Bacon M.D., FACS 10/04/2020, 3:57 PM

## 2020-10-04 NOTE — Patient Instructions (Signed)
Our surgery scheduler Pamala Hurry will call you within 24-48 hours to get you scheduled. If you have not heard from her after 48 hours, please call our office. You will need to get Covid tested before surgery and have the blue sheet available when she calls to write down important information. If you have any concerns or questions, please feel free to call our office.     Lumpectomy  A lumpectomy, sometimes called a partial mastectomy, is surgery to remove a cancerous tumor or mass (the lump) from a breast. It is a form of breast-conserving or breast-preservation surgery. This means that the cancerous tissue is removed but the breast remains intact. During a lumpectomy, the portion of the breast that contains the tumor is removed. Some normal tissue around the lump may be taken out to make sure that all of the tumor has been removed. Lymph nodes under your arm may also be removed and tested to find out if the cancer has spread. Lymph nodes are part of the body's disease-fighting system (immune system) and are usually the first place where breast cancer spreads. Tell a health care provider about:  Any allergies you have.  All medicines you are taking, including vitamins, herbs, eye drops, creams, and over-the-counter medicines.  Any problems you or family members have had with anesthetic medicines.  Any blood disorders you have.  Any surgeries you have had.  Any medical conditions you have.  Whether you are pregnant or may be pregnant. What are the risks? Generally, this is a safe procedure. However, problems may occur, including:  Bleeding.  Infection.  Allergic reaction to medicines.  Pain, swelling, weakness, or numbness in the arm on the side of your surgery.  Temporary swelling.  Change in the shape of the breast, particularly if a large portion is removed.  Scar tissue that forms at the surgical site and feels hard to the touch.  Blood clots. What happens before the  procedure? Staying hydrated Follow instructions from your health care provider about hydration, which may include:  Up to 2 hours before the procedure - you may continue to drink clear liquids, such as water, clear fruit juice, black coffee, and plain tea.   Eating and drinking restrictions Follow instructions from your health care provider about eating and drinking, which may include:  8 hours before the procedure - stop eating heavy meals or foods, such as meat, fried foods, or fatty foods.  6 hours before the procedure - stop eating light meals or foods, such as toast or cereal.  6 hours before the procedure - stop drinking milk or drinks that contain milk.  2 hours before the procedure - stop drinking clear liquids. Medicines Ask your health care provider about:  Changing or stopping your regular medicines. This is especially important if you are taking diabetes medicines or blood thinners.  Taking medicines such as aspirin and ibuprofen. These medicines can thin your blood. Do not take these medicines unless your health care provider tells you to take them.  Taking over-the-counter medicines, vitamins, herbs, and supplements. General instructions  Prior to surgery, your health care provider may do a procedure to locate and mark the tumor area in your breast (localization). This will help guide your surgeon to where the incision will be made. This may be done with: ? Imaging, such as a mammogram, ultrasound, or MRI. ? Insertion of a small wire, clip, or seed, or an implant that will reflect a radar signal.  You may have screening  tests or exams to get baseline measurements of your arm. These can be compared to measurements done after surgery to monitor for swelling (lymphedema) that can develop after having lymph nodes removed.  Ask your health care provider: ? How your surgery site will be marked. ? What steps will be taken to help prevent infection. These may  include:  Washing skin with a germ-killing soap.  Taking antibiotic medicine.  Plan to have someone take you home from the hospital or clinic.  Plan to have a responsible adult care for you for at least 24 hours after you leave the hospital or clinic. This is important. What happens during the procedure?  An IV will be inserted into one of your veins.  You will be given one or more of the following: ? A medicine to help you relax (sedative). ? A medicine to numb the area (local anesthetic). ? A medicine to make you fall asleep (general anesthetic).  Your health care provider will use a kind of electric scalpel that uses heat to reduce bleeding (electrocautery knife). A curved incision that follows the natural curve of your breast will be made. This type of incision will allow for minimal scarring and better healing.  The tumor will be removed along with some of the tissue around it. This will be sent to the lab for testing. Your health care provider may also remove lymph nodes at this time if needed.  If the tumor is close to the muscles over your chest, some muscle tissue may also be removed.  A small drain tube may be inserted into your breast area or armpit to collect fluid that may build up after surgery. This tube will be connected to a suction bulb on the outside of your body to remove the fluid.  The incision will be closed with stitches (sutures).  A bandage (dressing) may be placed over the incision. The procedure may vary among health care providers and hospitals.   What happens after the procedure?  Your blood pressure, heart rate, breathing rate, and blood oxygen level will be monitored until you leave the hospital or clinic.  You will be given medicine for pain as needed.  Your IV will be removed when you are able to eat and drink by mouth.  You will be encouraged to get up and walk as soon as you can. This is important to improve blood flow and breathing. Ask for  help if you feel weak or unsteady.  You may have: ? A drain tube in place for 2-3 days to prevent a collection of blood (hematoma) from developing in the breast. You will be given instructions about caring for the drain before you go home. ? A pressure bandage applied for 1-2 days to prevent bleeding or swelling. Your pressure bandage may look like a thick piece of fabric or an elastic wrap. Ask your health care provider how to care for your bandage at home.  You may be given a tight sleeve to wear over your arm on the side of your surgery. You should wear this sleeve as told by your health care provider.  Do not drive for 24 hours if you were given a sedative during your procedure. Summary  A lumpectomy, sometimes called a partial mastectomy, is surgery to remove a cancerous tumor or mass (the lump) from a breast.  During a lumpectomy, the portion of the breast that contains the tumor is removed. Lymph nodes under your arm may also be removed and  tested to find out if the cancer has spread.  Plan to have someone take you home from the hospital or clinic.  You may have a drain tube in place for 2-3 days to prevent a collection of blood (hematoma) from developing in the breast. You will be given instructions about caring for the drain before you go home. This information is not intended to replace advice given to you by your health care provider. Make sure you discuss any questions you have with your health care provider. Document Revised: 12/27/2018 Document Reviewed: 12/27/2018 Elsevier Patient Education  [employer].

## 2020-10-04 NOTE — H&P (View-Only) (Signed)
Patient ID: Lorraine Holmes, female   DOB: [birth year], 51 y.o.   MRN: [id number]  Chief Complaint: DCIS right breast.  History of Present Illness Lorraine Holmes is a 50 y.o. female with a screening mammogram showing a tightly clustered area of suspicious calcifications.  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  Prognostic indicators are pending.  It seemed to be the only area of concern, subcentimeter cluster of calcifications.   Of note she has utilized [facility] for 9 years, and just had a recent reimplantation.  She is premenopausal she has no family history of breast cancer.  She is pregnant twice and was 50 years old when her first child delivered.  She does perform monthly exams.  And has no known breast changes or issues.  Apparently had one cyst aspirated 1998.  Past Medical History Past Medical History:  Diagnosis Date  . Anemia   . Arthritis    Bilateral knee  . History of chicken pox       Past Surgical History:  Procedure Laterality Date  . BREAST BIOPSY Right 09/27/2020   stereo bx, ribbon clip, path pending   . COLONOSCOPY WITH PROPOFOL N/A 07/18/2019   Procedure: COLONOSCOPY WITH PROPOFOL;  Surgeon: Lorraine Landsman, MD;  Location: [facility];  Service: Gastroenterology;  Laterality: N/A;  . FOOT FRACTURE SURGERY Left    2002  . JOINT REPLACEMENT    . KNEE ARTHROSCOPY WITH MEDIAL MENISECTOMY Left 11/28/2014   Procedure: KNEE ARTHROSCOPY WITH MEDIAL MENISECTOMY;  Surgeon: Lorraine Knows, MD;  [facility]: [facility];  Service: Orthopedics;  Laterality: Left;  Marland Kitchen MEDIAL PARTIAL KNEE REPLACEMENT Right 2013  . meniscus tear Right 2012    No Known Allergies  Current Outpatient Medications  Medication Sig Dispense Refill  . amLODipine (NORVASC) 10 MG tablet Take 1 tablet (10 mg total) by mouth daily. 90 tablet 1  . cetirizine (ZYRTEC) 10 MG tablet Take 10 mg by mouth daily.    Marland Kitchen levonorgestrel (MIRENA) 20 MCG/24HR IUD 1 each by  Intrauterine route once.    . meloxicam (MOBIC) 7.5 MG tablet TAKE 1 TABLET BY MOUTH DAILY AS NEEDED FOR PAIN 30 tablet 1   No current facility-administered medications for this visit.    Family History Family History  Problem Relation Age of Onset  . Hypertension Mother   . Diabetes Mother   . Hypertension Father   . Heart disease Father 77       CAD - died MI  . Diabetes Maternal Grandmother   . Colon cancer Neg Hx   . Breast cancer Neg Hx   . Thyroid cancer Neg Hx       Social History Social History   Tobacco Use  . Smoking status: Never Smoker  . Smokeless tobacco: Never Used  Vaping Use  . Vaping Use: Never used  Substance Use Topics  . Alcohol use: Yes    Alcohol/week: 2.0 standard drinks    Types: 2 Standard drinks or equivalent per week    Comment: occ  . Drug use: No        Review of Systems  Constitutional: Negative.   HENT: Negative.   Eyes: Negative.   Respiratory: Negative.   Cardiovascular: Negative.   Gastrointestinal: Negative.   Genitourinary: Negative.   Skin: Negative.   Neurological: Negative.   Psychiatric/Behavioral: Negative.       Physical Exam Blood pressure (!) 158/89, pulse 97, temperature 98.7 F (37.1 C), temperature source Oral,  height 5' 5.75" (1.67 m), weight 287 lb 3.2 oz (130.3 kg), SpO2 97 %. Last Weight  Most recent update: 10/04/2020  2:58 PM   Weight  130.3 kg (287 lb 3.2 oz)            CONSTITUTIONAL: Well developed, and nourished, appropriately responsive and aware without distress.   EYES: Sclera non-icteric.   EARS, NOSE, MOUTH AND THROAT: Mask worn.    Hearing is intact to voice.  NECK: Trachea is midline, and there is no jugular venous distension.  LYMPH NODES:  Lymph nodes in the neck are not enlarged. RESPIRATORY:  Lungs are clear, and breath sounds are equal bilaterally. Normal respiratory effort without pathologic use of accessory muscles. CARDIOVASCULAR: Heart is regular in rate and rhythm. GI: The  abdomen is  soft, nontender, and nondistended.  GU: She reports she is a 27F cup.  No palpable masses.  Lorraine Holmes present as a Producer, television/film/video. MUSCULOSKELETAL:  Symmetrical muscle tone appreciated in all four extremities.    SKIN: Skin turgor is normal. No pathologic skin lesions appreciated.  NEUROLOGIC:  Motor and sensation appear grossly normal.  Cranial nerves are grossly without defect. PSYCH:  Alert and oriented to person, place and time. Affect is appropriate for situation.  Data Reviewed I have personally reviewed what is currently available of the patient's imaging, recent labs and medical records.   Labs:  CBC Latest Ref Rng & Units 06/27/2020 07/05/2019 03/24/2018  WBC 4.0 - 10.5 K/uL 10.4 8.4 8.6  Hemoglobin 12.0 - 15.0 g/dL 13.5 13.1 12.7  Hematocrit 36.0 - 46.0 % 42.2 41.1 39.1  Platelets 150.0 - 400.0 K/uL 315.0 257.0 291.0   CMP Latest Ref Rng & Units 06/27/2020 07/05/2019 03/24/2018  Glucose 70 - 99 mg/dL 99 100(H) 98  BUN 6 - 23 mg/dL 10 13 12   Creatinine 0.40 - 1.20 mg/dL 0.81 0.73 0.66  Sodium 135 - 145 mEq/L 137 137 140  Potassium 3.5 - 5.1 mEq/L 4.2 4.2 4.3  Chloride 96 - 112 mEq/L 103 104 106  CO2 19 - 32 mEq/L 29 25 29   Calcium 8.4 - 10.5 mg/dL 9.9 9.9 9.6  Total Protein 6.0 - 8.3 g/dL 7.5 7.9 7.3  Total Bilirubin 0.2 - 1.2 mg/dL 0.6 0.4 0.4  Alkaline Phos 39 - 117 U/L 88 89 77  AST 0 - 37 U/L 13 17 17   ALT 0 - 35 U/L 8 13 14    SURGICAL PATHOLOGY  CASE: [id number]  PATIENT: Lorraine Holmes  Surgical Pathology Report      Specimen Submitted:  A. Breast, right   Clinical History: 50 year old female with 0.8 cm group of upper outer  right breast calcifications. Ribbon clip placed       DIAGNOSIS:  A. BREAST WITH CALCIFICATIONS, RIGHT UPPER OUTER; STEREOTACTIC BIOPSY:  - HIGH-GRADE DUCTAL CARCINOMA IN SITU (DCIS), COMEDO TYPE, INVOLVING  SCLEROSING ADENOSIS.  - LOBULAR NEOPLASIA AND COLUMNAR CELL CHANGE.  - CALCIFICATIONS ASSOCIATED WITH DCIS AND COLUMNAR  CELL CHANGE.   Comment:  DCIS is present in 2 of 3 blocks and the largest linear focus measures 5  mm. Immunohistochemical staining for ER is deferred to an excision  specimen.    Imaging: Radiology review:   CLINICAL DATA:  50 year old female for further evaluation of RIGHT breast calcifications identified on screening exam.  EXAM: DIGITAL DIAGNOSTIC UNILATERAL RIGHT MAMMOGRAM  TECHNIQUE: Right digital diagnostic mammography was performed. Mammographic images were processed with CAD.  COMPARISON:  Previous exam(s).  ACR Breast Density Category c:  The breast tissue is heterogeneously dense, which may obscure small masses.  FINDINGS: Full field and magnification views of the RIGHT breast demonstrate a 0.8 cm tight group of heterogeneous calcifications within the UPPER OUTER RIGHT breast, middle depth.  Benign milk of calcium is also noted scattered throughout the UPPER OUTER RIGHT breast.  IMPRESSION: 0.8 cm group of indeterminate calcifications within the UPPER-OUTER RIGHT breast. Tissue sampling is recommended.  RECOMMENDATION: 3D/stereotactic guided RIGHT breast biopsy.  I have discussed the findings and recommendations with the patient. If applicable, a reminder letter will be sent to the patient regarding the next appointment.  BI-RADS CATEGORY  4: Suspicious.   Electronically Signed   By: Margarette Canada M.D.   On: 09/14/2020 11:07 Within last 24 hrs: No results found.  Assessment    Subcentimeter area of DCIS right breast, high-grade comedo type. There are no prognostic indicators pending at this time.  Patient Active Problem List   Diagnosis Date Noted  . Chronic pain of both knees 03/24/2018  . Obesity 02/26/2017  . HTN (hypertension) 02/26/2017  . Routine general medical examination at a health care facility 12/21/2013    Plan    RF tag right breast lumpectomy. At that time we will obtain prognostic indicators and have further  discussion regarding her current use of Mirena.  We discussed its likely warranted to go ahead and discontinue Mirena, however she wondered what her alternative would be.  We discussed the role of radiation treatment to the same breast to reduce her risk of long-term.   We also duct discussed the role of estrogen blockade in the long-term role of diminishing her risk of in breast recurrence.  Face-to-face time spent with the patient and accompanying care providers(if present) was 45 minutes, with more than 50% of the time spent counseling, educating, and coordinating care of the patient.      Ronny Bacon M.D., FACS 10/04/2020, 3:57 PM

## 2020-10-05 ENCOUNTER — Inpatient Hospital Stay: Payer: 59 | Attending: Oncology | Admitting: Oncology

## 2020-10-05 ENCOUNTER — Encounter: Payer: Self-pay | Admitting: Oncology

## 2020-10-05 ENCOUNTER — Inpatient Hospital Stay: Payer: 59

## 2020-10-05 VITALS — BP 142/69 | HR 95 | Temp 97.8°F | Wt 285.9 lb

## 2020-10-05 DIAGNOSIS — D0511 Intraductal carcinoma in situ of right breast: Secondary | ICD-10-CM | POA: Diagnosis present

## 2020-10-05 DIAGNOSIS — Z7189 Other specified counseling: Secondary | ICD-10-CM

## 2020-10-05 DIAGNOSIS — Z803 Family history of malignant neoplasm of breast: Secondary | ICD-10-CM | POA: Diagnosis not present

## 2020-10-05 HISTORY — DX: Intraductal carcinoma in situ of right breast: D05.11

## 2020-10-05 NOTE — Progress Notes (Signed)
New patient evaluation.   

## 2020-10-05 NOTE — Progress Notes (Signed)
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 09/14/2020, right unilateral diagnostic mammogram showed 0.8 cm indeterminate calcification within the upper outer right breast.  Tissue sampling is recommended. 09/27/2020 Right breast right upper outer quadrant calcification biopsy showed high-grade DCIS, comedo type, involving sclerosing adenosis.  Lobular neoplastic and columnar cell change.  Calcifications associated with DCIS and a columnar cell change.  ER receptor is deferred to an excision sample.  Patient has established care with Dr. Atha Starks who recommends lumpectomy. Patient is on Mirena for 7 to 8 years.  She had a recent reimplantation last year. Denies any family history of breast cancer. Patient was accompanied by her mother.     Review of Systems  Constitutional: Negative for appetite change, chills, fatigue and fever.  HENT:   Negative for hearing loss and voice change.   Eyes: Negative for eye problems.  Respiratory: Negative for chest tightness and cough.   Cardiovascular: Negative for chest pain.  Gastrointestinal: Negative for abdominal distention, abdominal pain and blood in stool.  Endocrine: Negative for hot flashes.  Genitourinary: Negative for difficulty urinating and frequency.   Musculoskeletal: Negative for arthralgias.  Skin: Negative for itching and rash.  Neurological: Negative for extremity weakness.  Hematological: Negative for  adenopathy.  Psychiatric/Behavioral: Negative for confusion. The patient is nervous/anxious.     MEDICAL HISTORY:  Past Medical History:  Diagnosis Date  . Anemia   . Arthritis    Bilateral knee  . Breast cancer ([place])    Ductal carcinoma in situ (DCIS) of right breast  . Ductal carcinoma in situ (DCIS) of right breast 10/05/2020  . History of chicken pox   . Hypertension     SURGICAL HISTORY: Past Surgical History:  Procedure Laterality Date  . BREAST BIOPSY Right 09/27/2020   stereo bx, ribbon clip, path pending   . COLONOSCOPY WITH PROPOFOL N/A 07/18/2019   Procedure: COLONOSCOPY WITH PROPOFOL;  Surgeon: Lin Landsman, MD;  Location: [facility] ENDOSCOPY;  Service: Gastroenterology;  Laterality: N/A;  . FOOT FRACTURE SURGERY Left    2002  . JOINT REPLACEMENT    . KNEE ARTHROSCOPY WITH MEDIAL MENISECTOMY Left 11/28/2014   Procedure: KNEE ARTHROSCOPY WITH MEDIAL MENISECTOMY;  Surgeon: Hessie Knows, MD;  Location: [facility] ORS;  Service: Orthopedics;  Laterality: Left;  Marland Kitchen MEDIAL PARTIAL KNEE REPLACEMENT Right 2013  . meniscus tear Right 2012    SOCIAL HISTORY: Social History   Socioeconomic History  . Marital status: Single    Spouse name: Not on file  . Number of children: 2  . Years of education: 19  . Highest education level: Not on file  Occupational History  . 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 Smoking status: Never Smoker  . Smokeless tobacco: Never Used  Vaping Use  . Vaping Use: Never used  Substance and Sexual Activity  . Alcohol use: Yes    Alcohol/week: 2.0 standard drinks    Types: 2  Standard drinks or equivalent per week    Comment: occ  . Drug use: No  . Sexual activity: Yes    Birth control/protection: I.U.D.    Comment: mirena  Other Topics Concern  . Not on file  Social History Narrative   Maygen grew up in [place]. She lives at home with her children and her mother. 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       Working [employer] in [place] Strain: Not on file  Food Insecurity: Not on file  Transportation Needs: Not on file  Physical Activity: Not on file  Stress: Not on file  Social Connections: Not on file  Intimate Partner Violence: Not on file    FAMILY HISTORY: Family History  Problem Relation Age of Onset  . Hypertension Mother   . Diabetes Mother   . Hypertension Father   . Heart disease Father 27       CAD - died [place]  . Diabetes Maternal Grandmother   . Breast cancer Maternal Aunt        great aunt  . Colon cancer Neg Hx   . Thyroid cancer Neg Hx     ALLERGIES:  has No Known Allergies.  MEDICATIONS:  Current Outpatient Medications  Medication Sig Dispense Refill  . amLODipine (NORVASC) 10 MG tablet Take 1 tablet (10 mg total) by mouth daily. 90 tablet 1  . cetirizine (ZYRTEC) 10 MG tablet Take 10 mg by mouth daily.    Marland Kitchen levonorgestrel (MIRENA) 20 MCG/24HR IUD 1 each by Intrauterine route once.    . meloxicam (MOBIC) 7.5 MG tablet TAKE 1 TABLET BY MOUTH DAILY AS NEEDED FOR PAIN 30 tablet 1   No current facility-administered medications for this visit.     PHYSICAL EXAMINATION: ECOG PERFORMANCE STATUS: 0 - Asymptomatic Vitals:   10/05/20 1115  BP: (!) 142/69  Pulse: 95  Temp: 97.8 F (36.6 C)  SpO2: 100%   Filed Weights   10/05/20 1115  Weight: 285 lb 14.4 oz (129.7 kg)    Physical Exam Constitutional:      General: She is not in acute distress. HENT:     Head: Normocephalic and atraumatic.  Eyes:     General: No scleral icterus. Cardiovascular:     Rate and Rhythm: Normal rate and regular rhythm.     Heart sounds: Normal heart sounds.  Pulmonary:     Effort: Pulmonary effort is normal. No respiratory distress.     Breath sounds: No wheezing.  Abdominal:     General: Bowel sounds are normal.  There is no distension.     Palpations: Abdomen is soft.  Musculoskeletal:        General: No deformity. Normal range of motion.     Cervical back: Normal range of motion and neck supple.  Skin:    General: Skin is warm and dry.     Findings: No erythema or rash.  Neurological:     Mental Status: She is alert and oriented to person, place, and time. Mental status is at baseline.     Cranial Nerves: No cranial nerve deficit.     Coordination: Coordination normal.  Psychiatric:        Mood and Affect: Mood normal.   Breast exam was performed in seated and lying down position. Right breast status post biopsy.  No palpable masses in both breasts.  No palpable axillary lymphadenopathy.    LABORATORY DATA:  I have reviewed the data as listed Lab Results  Component Value Date   WBC 10.4 06/27/2020   HGB 13.5 06/27/2020   HCT 42.2 06/27/2020   MCV 87.2 06/27/2020   PLT 315.0 06/27/2020   Recent Labs    06/27/20 0859  NA 137  K 4.2  CL 103  CO2 29  GLUCOSE 99  BUN 10  CREATININE 0.81  CALCIUM 9.9  PROT 7.5  ALBUMIN 4.2  AST 13  ALT 8  ALKPHOS 88  BILITOT 0.6   Iron/TIBC/Ferritin/ %Sat    Component Value Date/Time   IRON 83 03/19/2017 1046   FERRITIN 83.9 03/19/2017 1046   IRONPCTSAT 27.4 03/19/2017 1046      RADIOGRAPHIC STUDIES: I have personally reviewed the radiological images as listed and agreed with the findings in the report. MM Digital Diagnostic Unilat R  Result Date: 09/14/2020 CLINICAL DATA:  50 year old female for further evaluation of RIGHT breast calcifications identified on screening exam. EXAM: DIGITAL DIAGNOSTIC UNILATERAL RIGHT MAMMOGRAM TECHNIQUE: Right digital diagnostic mammography was performed. Mammographic images were processed with CAD. COMPARISON:  Previous exam(s). ACR Breast Density Category c: The breast tissue is heterogeneously dense, which may obscure small masses. FINDINGS: Full field and magnification views of the RIGHT breast  demonstrate a 0.8 cm tight group of heterogeneous calcifications within the UPPER OUTER RIGHT breast, middle depth. Benign milk of calcium is also noted scattered throughout the UPPER OUTER RIGHT breast. IMPRESSION: 0.8 cm group of indeterminate calcifications within the UPPER-OUTER RIGHT breast. Tissue sampling is recommended. RECOMMENDATION: 3D/stereotactic guided RIGHT breast biopsy. I have discussed the findings and recommendations with the patient. If applicable, a reminder letter will be sent to the patient regarding the next appointment. BI-RADS CATEGORY  4: Suspicious. Electronically Signed   By: Margarette Canada M.D.   On: 09/14/2020 11:07   MM CLIP PLACEMENT RIGHT  Result Date: 09/27/2020 CLINICAL DATA:  Evaluate RIBBON clip placement following stereotactic guided RIGHT breast biopsy. EXAM: 3D DIAGNOSTIC RIGHT MAMMOGRAM POST STEREOTACTIC BIOPSY COMPARISON:  Previous exam(s). FINDINGS: 3D Mammographic images were obtained following stereotactic guided biopsy of 0.8 cm group of UPPER-OUTER RIGHT breast calcifications. The RIBBON biopsy marking clip is in expected position at the site of biopsy. IMPRESSION: Appropriate positioning of the RIBBON shaped biopsy marking clip at the site of biopsy in the UPPER OUTER RIGHT breast. Final Assessment: Post Procedure Mammograms for Marker Placement Electronically Signed   By: Margarette Canada M.D.   On: 09/27/2020 08:44   MM RT BREAST BX W LOC DEV 1ST LESION IMAGE BX SPEC STEREO GUIDE  Addendum Date: 09/28/2020   ADDENDUM REPORT: 09/28/2020 16:00 ADDENDUM: PATHOLOGY revealed: A. BREAST WITH CALCIFICATIONS, RIGHT UPPER OUTER; STEREOTACTIC BIOPSY: - HIGH-GRADE DUCTAL CARCINOMA IN SITU (DCIS), COMEDO TYPE, INVOLVING SCLEROSING ADENOSIS. - LOBULAR NEOPLASIA AND COLUMNAR CELL CHANGE. - CALCIFICATIONS ASSOCIATED WITH DCIS AND COLUMNAR CELL CHANGE. Comment: DCIS is present in 2 of 3 blocks and the largest linear focus measures 5 mm. Immunohistochemical staining for ER is  deferred to an excision specimen. Pathology results are CONCORDANT with imaging findings, per Dr. Hassan Rowan. Pathology results and recommendations below were discussed with patient by telephone on 09/28/2020. Patient reported biopsy site within normal limits with slight tenderness at the site, and no significant bruising. Post biopsy care instructions were reviewed, questions were answered and my direct phone number was provided to patient. Patient was instructed to call [facility] if any concerns or questions  arise related to the biopsy. Recommendations: 1. Surgical consultation: Request for surgical consultation relayed to Al Pimple RN and Tanya Nones RN at [facility] by Electa Sniff RN on 09/28/2020. 2. Consider bilateral breast MRI due to high grade DCIS and to determine extent of breast disease. Pathology results reported by Electa Sniff RN on 09/28/2020. Electronically Signed   By: Margarette Canada M.D.   On: 09/28/2020 16:00   Result Date: 09/28/2020 CLINICAL DATA:  50 year old female for tissue sampling of 0.8 cm group of UPPER-OUTER RIGHT breast calcifications. EXAM: RIGHT BREAST STEREOTACTIC CORE NEEDLE BIOPSY COMPARISON:  Previous exams. FINDINGS: The patient and I discussed the procedure of stereotactic-guided biopsy including benefits and alternatives. We discussed the high likelihood of a successful procedure. We discussed the risks of the procedure including infection, bleeding, tissue injury, clip migration, and inadequate sampling. Informed written consent was given. The usual time out protocol was performed immediately prior to the procedure. Using sterile technique and 1% Lidocaine with and without epinephrine as local anesthetic, under stereotactic guidance, a 9 gauge vacuum assisted device was used to perform core needle biopsy of 0.8 cm group of calcifications within the UPPER-OUTER RIGHT breast using a LATERAL approach. Specimen radiograph was performed showing  calcifications. Specimens with calcifications are identified for pathology. Lesion quadrant: UPPER-OUTER RIGHT breast At the conclusion of the procedure, a RIBBON tissue marker clip was deployed into the biopsy cavity. Follow-up 2-view mammogram was performed and dictated separately. IMPRESSION: Stereotactic-guided biopsy of 0.8 cm group of UPPER-OUTER RIGHT breast calcifications. No apparent complications. Electronically Signed: By: Margarette Canada M.D. On: 09/27/2020 08:43      ASSESSMENT & PLAN:  1. Ductal carcinoma in situ (DCIS) of right breast   2. Goals of care, counseling/discussion    Images were independently reviewed by me.  Pathology was reviewed and discussed with patient. The DCIS diagnosis and care plan were discussed with patient in detail.  We discussed that DCIS is a non invasive breast cancer,  cancer is only in the duct and has not spread into the tissues around it.  Patient's DCIS is high-grade.  Discussed the 10% possibility of invasive component find in the lumpectomy surgical specimen.  Pathology also showed lobular neoplasm.  I discussed with patient about MRI breast for further evaluation.  Patient wants to consider and know the timeframe of the MRI and she will call and update me about her decision whether to proceed with MRI or not. Recommend lumpectomy.  Patient will need adjuvant radiation followed by antiestrogen treatment if ER is positive. Goals of care is with curative intent.  She has CBC CMP blood work done few months ago.  Labs are reviewed. All questions were answered. The patient knows to call the clinic with any problems questions or concerns.  cc Burnard Hawthorne, FNP    Return of visit: To be determined. Thank you for this kind referral and the opportunity to participate in the care of this patient. A copy of today's note is routed to referring provider    Earlie Server, MD, PhD Hematology Oncology [facility] at [facility] Pager-  [phone] 10/05/2020

## 2020-10-08 ENCOUNTER — Other Ambulatory Visit: Payer: Self-pay | Admitting: Surgery

## 2020-10-08 ENCOUNTER — Telehealth: Payer: Self-pay | Admitting: *Deleted

## 2020-10-08 DIAGNOSIS — D0511 Intraductal carcinoma in situ of right breast: Secondary | ICD-10-CM

## 2020-10-08 NOTE — Telephone Encounter (Signed)
FYI...   I called [facility] again this morning to see when the nx date was for a Breast MRI... (A message was left on [name] VM).Marland KitchenMarland Kitchen I called pt to make her aware that I was waiting to see what the nx avail date was and Pt stated that she did NOT want to have and MRI done and that she wanted to go ahead and proceed with having surgery!!

## 2020-10-09 ENCOUNTER — Telehealth: Payer: Self-pay | Admitting: Surgery

## 2020-10-09 NOTE — Telephone Encounter (Addendum)
Outbound call made to the pt; left v/m requesting a call back to relay the following surgery scheduling information.  When the pt returns the call, plz advise the following Pre-Admission date/time, COVID Testing date and Surgery date:  Surgery Date: 10/24/20 Preadmission Testing Date: 10/17/20 (phone 8a-1p) Covid Testing Date: 10/22/20 @ 8:45 am - patient to go to the [facility] ([place])   As well, plz make sure the patient has been made aware to call [phone], between 1-3:00pm the day before surgery, to find out what time to arrive for surgery.    Finally, Samantha w/[facility] has left a v/m requesting the pt call her to schedule the RF tag placement prior to her surgery.  Thank you

## 2020-10-09 NOTE — Telephone Encounter (Signed)
FYI

## 2020-10-10 ENCOUNTER — Telehealth: Payer: Self-pay

## 2020-10-10 NOTE — Telephone Encounter (Signed)
Patient notified of surgery date, pre-admissin date, covid test and she has spoke with [place] at [facility].

## 2020-10-17 ENCOUNTER — Ambulatory Visit
Admission: RE | Admit: 2020-10-17 | Discharge: 2020-10-17 | Disposition: A | Discharge status: Home / Self Care | Payer: 59 | Source: Ambulatory Visit | Attending: Surgery | Admitting: Surgery

## 2020-10-17 ENCOUNTER — Other Ambulatory Visit: Payer: Self-pay

## 2020-10-17 ENCOUNTER — Encounter
Admission: RE | Admit: 2020-10-17 | Discharge: 2020-10-17 | Disposition: A | Discharge status: Home / Self Care | Payer: 59 | Source: Ambulatory Visit | Attending: Surgery | Admitting: Surgery

## 2020-10-17 DIAGNOSIS — D0511 Intraductal carcinoma in situ of right breast: Secondary | ICD-10-CM | POA: Diagnosis present

## 2020-10-17 HISTORY — DX: Obesity, unspecified: E66.9

## 2020-10-17 HISTORY — DX: Personal history of other medical treatment: Z92.89

## 2020-10-17 NOTE — Patient Instructions (Addendum)
INSTRUCTIONS FOR SURGERY     Your surgery is scheduled for:   Wednesday, April 20TH     To find out your arrival time for the day of surgery,          please call [phone] between 1 pm and 3 pm on :  Tuesday, April 10TH IF YOU NEED TO GO TO MAMMOGRAPHY AS DISCUSSED, YOU NEED TO ARRIVE IN  THE MEDICAL MALL AT 7:15 AM AND THEY WILL DIRECT YOU TO MAMMO.     ONCE COMPLETED THERE, THEY WILL GET YOU TO THE SECOND FLOOR OF THE    MEDICAL MALL.  YOUR SON CAN GO RIGHT THERE AND CHECK IN, IF HE IS STAYING.   REMEMBER: Instructions that are not followed completely may result in serious medical risk,  up to and including death, or upon the discretion of your surgeon and anesthesiologist,            your surgery may need to be rescheduled.  __X__ 1. Do not eat food after midnight the night before your procedure.                    No gum, candy, lozenger, tic tacs, tums or hard candies.                  ABSOLUTELY NOTHING SOLID IN YOUR MOUTH AFTER MIDNIGHT                    You may drink unlimited clear liquids up to 2 hours before you are scheduled to arrive for surgery.                   Do not drink anything within those 2 hours unless you need to take medicine, then take the                   smallest amount you need.  Clear liquids include:  water, apple juice without pulp,                   any flavor Gatorade, Black coffee, black tea.  Sugar may be added but no dairy/ honey /lemon.                        Broth and jello is not considered a clear liquid.  __x__  2. On the morning of surgery, please brush your teeth with toothpaste and water. You may rinse with                  mouthwash if you wish but DO NOT SWALLOW TOOTHPASTE OR MOUTHWASH  __X___3. NO alcohol for 24 hours before or after surgery.  __x___ 4.  Do NOT smoke or use e-cigarettes for 24 HOURS PRIOR TO SURGERY.                      DO NOT Use any chewable tobacco  products for at least 6 hours prior to surgery.  __x___ 5. If you start any new medication after this appointment and prior to surgery, please  Bring it with you on the day of surgery.  ___x__ 6. Notify your doctor if there is any change in your medical condition, such as fever,                   infection, vomitting, diarrhea or any open sores.  __x___ 7.  USE the CHG SOAP as instructed, the night before surgery and the day of surgery.                   Once you have washed with this soap, do NOT use any of the following: Powders, perfumes                    or lotions. Please do not wear make up, hairpins, clips or nail polish. You may not wear deodorant.                   Men may shave their face and neck.  Women need to shave 48 hours prior to surgery.                   DO NOT wear ANY jewelry on the day of surgery. If there are rings that are too tight to                    remove easily, please address this prior to the surgery day. Piercings need to be removed.                                                                     NO METAL ON YOUR BODY.                    Do NOT bring any valuables.  If you came to Pre-Admit testing then you will not need license,                     insurance card or credit card.  If you will be staying overnight, please either leave your things in                     the car or have your family be responsible for these items.                     [facility] [place] IS NOT RESPONSIBLE FOR BELONGINGS OR VALUABLES.  ___X__ 8. DO NOT wear contact lenses on surgery day.  You may not have dentures,                     Hearing aides, contacts or glasses in the operating room. These items can be                    Placed in the Recovery Room to receive immediately after surgery.  __x___ 9. IF YOU ARE SCHEDULED TO GO HOME ON THE SAME DAY, YOU MUST                   Have someone to drive you home and to stay with you  for the first 24 hours.  Have an arrangement prior to arriving on surgery day.  ___x__ 10. Take the following medications on the morning of surgery with a sip of water:                              1. ZYRTEC                     2.                       __X__  12. STOP ALL ASPIRIN PRODUCTS AS OF TODAY, April 13TH                       THIS INCLUDES BC POWDERS / GOODIES POWDER  __x___ 13. STOP Anti-inflammatories as of TODAY, April 13TH                      This includes IBUPROFEN / MOTRIN / ADVIL / ALEVE/ NAPROXYN / MELOXICAM                    YOU MAY TAKE TYLENOL ANY TIME PRIOR TO SURGERY.  __X___ 14.  You may continue taking Vitamin D3 but do not take on the morning of surgery.  ______18. Wear clean and comfortable clothing to the hospital.  [facility]. BRING PHONE NUMBERS FOR YOUR CONTACT PEOPLE.  NO REPETITIVE MOTION USING YOUR RIGHT ARM AFTER SURGERY (ie, sweeping, vacuuming)     And do not carry a pocket book on your right shoulder for the first week.

## 2020-10-22 ENCOUNTER — Other Ambulatory Visit
Admission: RE | Admit: 2020-10-22 | Discharge: 2020-10-22 | Disposition: A | Discharge status: Home / Self Care | Payer: 59 | Source: Ambulatory Visit | Attending: Surgery | Admitting: Surgery

## 2020-10-22 ENCOUNTER — Other Ambulatory Visit: Payer: Self-pay

## 2020-10-22 DIAGNOSIS — I10 Essential (primary) hypertension: Secondary | ICD-10-CM | POA: Diagnosis not present

## 2020-10-22 DIAGNOSIS — Z01818 Encounter for other preprocedural examination: Secondary | ICD-10-CM | POA: Insufficient documentation

## 2020-10-22 LAB — COMPREHENSIVE METABOLIC PANEL
ALT: 11 U/L (ref 0–44)
AST: 16 U/L (ref 15–41)
Albumin: 3.9 g/dL (ref 3.5–5.0)
Alkaline Phosphatase: 78 U/L (ref 38–126)
Anion gap: 10 (ref 5–15)
BUN: 12 mg/dL (ref 6–20)
CO2: 24 mmol/L (ref 22–32)
Calcium: 9.5 mg/dL (ref 8.9–10.3)
Chloride: 105 mmol/L (ref 98–111)
Creatinine, Ser: 0.82 mg/dL (ref 0.44–1.00)
GFR, Estimated: >60 mL/min (ref >60)
Glucose, Bld: 98 mg/dL (ref 70–99)
Potassium: 3.8 mmol/L (ref 3.5–5.1)
Sodium: 139 mmol/L (ref 135–145)
Total Bilirubin: 0.6 mg/dL (ref 0.3–1.2)
Total Protein: 7.7 g/dL (ref 6.5–8.1)

## 2020-10-22 LAB — CBC WITH DIFFERENTIAL/PLATELET
Abs Immature Granulocytes: 0.03 10*3/uL (ref 0.00–0.07)
Basophils Absolute: 0.1 10*3/uL (ref 0.0–0.1)
Basophils Relative: 1 %
Eosinophils Absolute: 0.1 10*3/uL (ref 0.0–0.5)
Eosinophils Relative: 1 %
HCT: 40.5 % (ref 36.0–46.0)
Hemoglobin: 12.9 g/dL (ref 12.0–15.0)
Immature Granulocytes: 0 %
Lymphocytes Relative: 27 %
Lymphs Abs: 2.8 10*3/uL (ref 0.7–4.0)
MCH: 28.2 pg (ref 26.0–34.0)
MCHC: 31.9 g/dL (ref 30.0–36.0)
MCV: 88.6 fL (ref 80.0–100.0)
Monocytes Absolute: 0.7 10*3/uL (ref 0.1–1.0)
Monocytes Relative: 7 %
Neutro Abs: 6.7 10*3/uL (ref 1.7–7.7)
Neutrophils Relative %: 64 %
Platelets: 254 10*3/uL (ref 150–400)
RBC: 4.57 MIL/uL (ref 3.87–5.11)
RDW: 14.7 % (ref 11.5–15.5)
WBC: 10.4 10*3/uL (ref 4.0–10.5)
nRBC: 0 % (ref 0.0–0.2)

## 2020-10-24 ENCOUNTER — Encounter
Admission: RE | Disposition: A | Discharge status: Home / Self Care | Payer: Self-pay | Source: Home / Self Care | Attending: Surgery

## 2020-10-24 ENCOUNTER — Other Ambulatory Visit: Payer: Self-pay

## 2020-10-24 ENCOUNTER — Ambulatory Visit: Payer: 59 | Admitting: Certified Registered"

## 2020-10-24 ENCOUNTER — Ambulatory Visit
Admission: RE | Admit: 2020-10-24 | Discharge: 2020-10-24 | Disposition: A | Discharge status: Home / Self Care | Payer: 59 | Source: Ambulatory Visit | Attending: Surgery | Admitting: Surgery

## 2020-10-24 ENCOUNTER — Ambulatory Visit
Admission: RE | Admit: 2020-10-24 | Discharge: 2020-10-24 | Disposition: A | Discharge status: Home / Self Care | Payer: 59 | Attending: Surgery | Admitting: Surgery

## 2020-10-24 ENCOUNTER — Encounter: Payer: Self-pay | Admitting: Surgery

## 2020-10-24 DIAGNOSIS — M25562 Pain in left knee: Secondary | ICD-10-CM | POA: Diagnosis not present

## 2020-10-24 DIAGNOSIS — Z6841 Body Mass Index (BMI) 40.0 and over, adult: Secondary | ICD-10-CM | POA: Diagnosis not present

## 2020-10-24 DIAGNOSIS — Z96651 Presence of right artificial knee joint: Secondary | ICD-10-CM | POA: Diagnosis not present

## 2020-10-24 DIAGNOSIS — D0511 Intraductal carcinoma in situ of right breast: Secondary | ICD-10-CM

## 2020-10-24 DIAGNOSIS — G8929 Other chronic pain: Secondary | ICD-10-CM | POA: Diagnosis not present

## 2020-10-24 DIAGNOSIS — N62 Hypertrophy of breast: Secondary | ICD-10-CM | POA: Diagnosis not present

## 2020-10-24 DIAGNOSIS — N6081 Other benign mammary dysplasias of right breast: Secondary | ICD-10-CM | POA: Diagnosis not present

## 2020-10-24 DIAGNOSIS — N6489 Other specified disorders of breast: Secondary | ICD-10-CM | POA: Diagnosis not present

## 2020-10-24 DIAGNOSIS — E669 Obesity, unspecified: Secondary | ICD-10-CM | POA: Insufficient documentation

## 2020-10-24 DIAGNOSIS — Z975 Presence of (intrauterine) contraceptive device: Secondary | ICD-10-CM | POA: Diagnosis not present

## 2020-10-24 DIAGNOSIS — M25561 Pain in right knee: Secondary | ICD-10-CM | POA: Insufficient documentation

## 2020-10-24 DIAGNOSIS — Z79899 Other long term (current) drug therapy: Secondary | ICD-10-CM | POA: Insufficient documentation

## 2020-10-24 DIAGNOSIS — I10 Essential (primary) hypertension: Secondary | ICD-10-CM | POA: Diagnosis not present

## 2020-10-24 HISTORY — PX: BREAST LUMPECTOMY: SHX2

## 2020-10-24 HISTORY — PX: BREAST LUMPECTOMY WITH RADIOFREQUENCY TAG IDENTIFICATION: SHX6884

## 2020-10-24 LAB — POCT PREGNANCY, URINE: Preg Test, Ur: NEGATIVE

## 2020-10-24 SURGERY — BREAST LUMPECTOMY WITH RADIOFREQUENCY TAG IDENTIFICATION
Anesthesia: General | Site: Breast | Laterality: Right

## 2020-10-24 MED ORDER — LIDOCAINE HCL URETHRAL/MUCOSAL 2 % EX GEL
CUTANEOUS | Status: AC
  Filled 2020-10-24: qty 5

## 2020-10-24 MED ORDER — ONDANSETRON HCL 4 MG/2ML IJ SOLN: 4.0000 mg | Freq: Once | INTRAMUSCULAR | Status: DC | PRN

## 2020-10-24 MED ORDER — BUPIVACAINE-EPINEPHRINE (PF) 0.25% -1:200000 IJ SOLN
INTRAMUSCULAR | Status: DC | PRN
  Administered 2020-10-24: 30 mL

## 2020-10-24 MED ORDER — BUPIVACAINE LIPOSOME 1.3 % IJ SUSP
20.0000 mL | Freq: Once | INTRAMUSCULAR | Status: DC
Start: 2020-10-24 — End: 2020-10-24

## 2020-10-24 MED ORDER — ACETAMINOPHEN 500 MG PO TABS: 1000.0000 mg | ORAL_TABLET | ORAL | Status: AC

## 2020-10-24 MED ORDER — CHLORHEXIDINE GLUCONATE 0.12 % MT SOLN
OROMUCOSAL | Status: AC
  Administered 2020-10-24: 15 mL via OROMUCOSAL
  Filled 2020-10-24: qty 15

## 2020-10-24 MED ORDER — PROPOFOL 500 MG/50ML IV EMUL
INTRAVENOUS | Status: AC
  Filled 2020-10-24: qty 50

## 2020-10-24 MED ORDER — CHLORHEXIDINE GLUCONATE 0.12 % MT SOLN: 15.0000 mL | Freq: Once | OROMUCOSAL | Status: AC

## 2020-10-24 MED ORDER — FENTANYL CITRATE (PF) 100 MCG/2ML IJ SOLN: 25.0000 ug | INTRAMUSCULAR | Status: DC | PRN

## 2020-10-24 MED ORDER — KETAMINE HCL 10 MG/ML IJ SOLN
INTRAMUSCULAR | Status: DC | PRN
  Administered 2020-10-24: 20 mg via INTRAVENOUS

## 2020-10-24 MED ORDER — GABAPENTIN 300 MG PO CAPS
ORAL_CAPSULE | ORAL | Status: AC
  Administered 2020-10-24: 300 mg via ORAL
  Filled 2020-10-24: qty 1

## 2020-10-24 MED ORDER — ORAL CARE MOUTH RINSE: 15.0000 mL | Freq: Once | OROMUCOSAL | Status: AC

## 2020-10-24 MED ORDER — PROPOFOL 10 MG/ML IV BOLUS
INTRAVENOUS | Status: DC | PRN
  Administered 2020-10-24: 150 ug/kg/min via INTRAVENOUS
  Administered 2020-10-24: 160 mg via INTRAVENOUS
  Administered 2020-10-24: 40 mg via INTRAVENOUS
  Administered 2020-10-24: 180 ug/kg/min via INTRAVENOUS
  Administered 2020-10-24: 150 ug/kg/min via INTRAVENOUS

## 2020-10-24 MED ORDER — CELECOXIB 200 MG PO CAPS: 200.0000 mg | ORAL_CAPSULE | ORAL | Status: AC

## 2020-10-24 MED ORDER — MIDAZOLAM HCL 2 MG/2ML IJ SOLN
INTRAMUSCULAR | Status: AC
  Filled 2020-10-24: qty 2

## 2020-10-24 MED ORDER — CHLORHEXIDINE GLUCONATE CLOTH 2 % EX PADS: 6.0000 | MEDICATED_PAD | Freq: Once | CUTANEOUS | Status: DC

## 2020-10-24 MED ORDER — LACTATED RINGERS IV SOLN: INTRAVENOUS | Status: DC

## 2020-10-24 MED ORDER — FAMOTIDINE 20 MG PO TABS
ORAL_TABLET | ORAL | Status: AC
  Administered 2020-10-24: 20 mg via ORAL
  Filled 2020-10-24: qty 1

## 2020-10-24 MED ORDER — PROPOFOL 10 MG/ML IV BOLUS
INTRAVENOUS | Status: AC
  Filled 2020-10-24: qty 20

## 2020-10-24 MED ORDER — GABAPENTIN 300 MG PO CAPS: 300.0000 mg | ORAL_CAPSULE | ORAL | Status: AC

## 2020-10-24 MED ORDER — LIDOCAINE HCL (CARDIAC) PF 100 MG/5ML IV SOSY
PREFILLED_SYRINGE | INTRAVENOUS | Status: DC | PRN
  Administered 2020-10-24: 100 mg via INTRAVENOUS

## 2020-10-24 MED ORDER — BUPIVACAINE LIPOSOME 1.3 % IJ SUSP
INTRAMUSCULAR | Status: AC
  Filled 2020-10-24: qty 20

## 2020-10-24 MED ORDER — MIDAZOLAM HCL 2 MG/2ML IJ SOLN
INTRAMUSCULAR | Status: DC | PRN
  Administered 2020-10-24: 2 mg via INTRAVENOUS

## 2020-10-24 MED ORDER — CELECOXIB 200 MG PO CAPS
ORAL_CAPSULE | ORAL | Status: AC
  Administered 2020-10-24: 200 mg via ORAL
  Filled 2020-10-24: qty 1

## 2020-10-24 MED ORDER — FENTANYL CITRATE (PF) 100 MCG/2ML IJ SOLN
INTRAMUSCULAR | Status: DC | PRN
  Administered 2020-10-24 (×3): 25 ug via INTRAVENOUS

## 2020-10-24 MED ORDER — KETAMINE HCL 50 MG/5ML IJ SOSY
PREFILLED_SYRINGE | INTRAMUSCULAR | Status: AC
  Filled 2020-10-24: qty 5

## 2020-10-24 MED ORDER — CEFAZOLIN IN SODIUM CHLORIDE 3-0.9 GM/100ML-% IV SOLN
3.0000 g | INTRAVENOUS | Status: AC
Start: 2020-10-24 — End: 2020-10-24
  Administered 2020-10-24: 3 g via INTRAVENOUS
  Filled 2020-10-24: qty 100

## 2020-10-24 MED ORDER — PHENYLEPHRINE HCL (PRESSORS) 10 MG/ML IV SOLN
INTRAVENOUS | Status: DC | PRN
  Administered 2020-10-24: 100 ug via INTRAVENOUS
  Administered 2020-10-24: 150 ug via INTRAVENOUS
  Administered 2020-10-24 (×2): 100 ug via INTRAVENOUS
  Administered 2020-10-24: 200 ug via INTRAVENOUS
  Administered 2020-10-24: 50 ug via INTRAVENOUS
  Administered 2020-10-24: 200 ug via INTRAVENOUS
  Administered 2020-10-24 (×2): 100 ug via INTRAVENOUS

## 2020-10-24 MED ORDER — ACETAMINOPHEN 500 MG PO TABS
ORAL_TABLET | ORAL | Status: AC
  Administered 2020-10-24: 1000 mg via ORAL
  Filled 2020-10-24: qty 2

## 2020-10-24 MED ORDER — HYDROCODONE-ACETAMINOPHEN 5-325 MG PO TABS: 1.0000 | ORAL_TABLET | Freq: Four times a day (QID) | ORAL | 0 refills | Status: DC | PRN

## 2020-10-24 MED ORDER — FENTANYL CITRATE (PF) 100 MCG/2ML IJ SOLN
INTRAMUSCULAR | Status: AC
  Filled 2020-10-24: qty 2

## 2020-10-24 MED ORDER — LIDOCAINE HCL (PF) 2 % IJ SOLN
INTRAMUSCULAR | Status: AC
  Filled 2020-10-24: qty 5

## 2020-10-24 MED ORDER — BUPIVACAINE-EPINEPHRINE (PF) 0.25% -1:200000 IJ SOLN
INTRAMUSCULAR | Status: AC
  Filled 2020-10-24: qty 30

## 2020-10-24 MED ORDER — FAMOTIDINE 20 MG PO TABS: 20.0000 mg | ORAL_TABLET | Freq: Once | ORAL | Status: AC

## 2020-10-24 MED ORDER — OXYMETAZOLINE HCL 0.05 % NA SOLN
NASAL | Status: AC
  Filled 2020-10-24: qty 30

## 2020-10-24 MED ORDER — DEXAMETHASONE SODIUM PHOSPHATE 10 MG/ML IJ SOLN
INTRAMUSCULAR | Status: DC | PRN
  Administered 2020-10-24: 8 mg via INTRAVENOUS

## 2020-10-24 SURGICAL SUPPLY — 39 items
ADH SKN CLS APL DERMABOND .7 (GAUZE/BANDAGES/DRESSINGS) ×1
APL PRP STRL LF DISP 70% ISPRP (MISCELLANEOUS) ×1
APPLIER CLIP 9.375 SM OPEN (CLIP)
APR CLP SM 9.3 20 MLT OPN (CLIP)
BLADE SURG 15 STRL LF DISP TIS (BLADE) ×1 IMPLANT
BLADE SURG 15 STRL SS (BLADE) ×2
CANISTER SUCT 1200ML W/VALVE (MISCELLANEOUS) ×2 IMPLANT
CHLORAPREP W/TINT 26 (MISCELLANEOUS) ×2 IMPLANT
CLIP APPLIE 9.375 SM OPEN (CLIP) IMPLANT
CNTNR SPEC 2.5X3XGRAD LEK (MISCELLANEOUS)
CONT SPEC 4OZ STER OR WHT (MISCELLANEOUS)
CONT SPEC 4OZ STRL OR WHT (MISCELLANEOUS)
CONTAINER SPEC 2.5X3XGRAD LEK (MISCELLANEOUS) IMPLANT
COVER WAND RF STERILE (DRAPES) ×3 IMPLANT
DERMABOND ADVANCED (GAUZE/BANDAGES/DRESSINGS) ×1
DERMABOND ADVANCED .7 DNX12 (GAUZE/BANDAGES/DRESSINGS) ×1 IMPLANT
DEVICE DUBIN SPECIMEN MAMMOGRA (MISCELLANEOUS) ×2 IMPLANT
DRAPE LAPAROTOMY TRNSV 106X77 (MISCELLANEOUS) ×2 IMPLANT
ELECT CAUTERY BLADE TIP 2.5 (TIP) ×2
ELECT REM PT RETURN 9FT ADLT (ELECTROSURGICAL) ×2
ELECTRODE CAUTERY BLDE TIP 2.5 (TIP) ×1 IMPLANT
ELECTRODE REM PT RTRN 9FT ADLT (ELECTROSURGICAL) ×1 IMPLANT
GAUZE 4X4 16PLY RFD (DISPOSABLE) ×1 IMPLANT
GLOVE ORTHO TXT STRL SZ7.5 (GLOVE) ×2 IMPLANT
GOWN STRL REUS W/ TWL LRG LVL3 (GOWN DISPOSABLE) ×2 IMPLANT
GOWN STRL REUS W/TWL LRG LVL3 (GOWN DISPOSABLE) ×4
KIT MARKER MARGIN INK (KITS) IMPLANT
KIT TURNOVER KIT A (KITS) ×2 IMPLANT
MANIFOLD NEPTUNE II (INSTRUMENTS) ×2 IMPLANT
NEEDLE HYPO 22GX1.5 SAFETY (NEEDLE) ×2 IMPLANT
PACK BASIN MINOR ARMC (MISCELLANEOUS) ×2 IMPLANT
SET LOCALIZER 20 PROBE US (MISCELLANEOUS) ×2 IMPLANT
SUT MNCRL 4-0 (SUTURE) ×2
SUT MNCRL 4-0 27XMFL (SUTURE) ×1
SUT VIC AB 3-0 SH 27 (SUTURE) ×2
SUT VIC AB 3-0 SH 27X BRD (SUTURE) ×1 IMPLANT
SUTURE MNCRL 4-0 27XMF (SUTURE) ×1 IMPLANT
SYR 20ML LL LF (SYRINGE) ×2 IMPLANT
WATER STERILE IRR 1000ML POUR (IV SOLUTION) ×2 IMPLANT

## 2020-10-24 NOTE — Op Note (Signed)
  Pre-operative Diagnosis: Breast Cancer, DCIS, Right breast.    Post-operative Diagnosis: Same  Surgeon: Ronny Bacon, M.D., FACS  Anesthesia: General LMA.  Procedure: Right lateral breast lumpectomy, RFID tag directed,  Procedure Details  The patient was seen again in the Holding Room. The benefits, complications, treatment options, and expected outcomes were discussed with the patient. The risks of bleeding, infection, recurrence of symptoms, failure to resolve symptoms, hematoma, seroma, open wound, cosmetic deformity, and the need for further surgery were discussed.  The patient was taken to Operating Room, identified as Lorraine Holmes and the procedure verified.  A Time Out was held and the above information confirmed.  Prior to the induction of general anesthesia, antibiotic prophylaxis was administered. VTE prophylaxis was in place. The patient was positioned in the supine position. Appropriate anesthesia was then administered and tolerated well. The LOCALizer is used to mark the skin for incision.  The breast was taped to minimize fluidity. Attention was turned to the RFID tag localization site where an incision was made. Dissection using the LOCALizer to perform a lumpectomy, however was severely limited due to the fact that 2 of our wired probes were nonfunctional.  However the doughnut/finder with the localizer helped me get a gross region, and so dissection continued to obtain an adequate lumpectomy with adequate margins.  I encountered the RF ID tag during sharp dissection, I grasped the adjacent tissue with Allis forceps to provide additional excision of the immediate region.  This was done with minimal electrocautery and primarily sharp dissection with Mayo scissors.  Hemostasis was obtained immediately.  And the cavity packed.  The specimen was taken to the back table and painted to demarcate the 6 surfaces of potential margin.  After painting this region and placing within the  Faxitron, I was concerned that the closest margin would be the superior 1.  So an additional shave margin was obtained from the superior aspect and this was also painted red.    I returned to the cavity to remove the packing, and hemostasis was confirmed with electrocautery.   Once assuring that hemostasis was adequate and checked multiple times with water irrigation, the wound was closed with interrupted 3-0 Vicryl followed by 4-0 subcuticular Monocryl sutures. Dermabond is utilized to seal the incision.  A depot of the remaining quarter percent Marcaine with epinephrine was infiltrated into the biopsy cavity.   Findings: Faxitron imaging: Both markers present, clip still within tissue.  RF ID tag is free, alongside tissue.  Calcifications noted in specimen adjacent to initial biopsy clip.  I believe these are adequately within the tissue.  Estimated Blood Loss: Minimal         Drains: None         Specimens: Lateral right breast tissue.  With additional superior margin.       Complications: None.         Condition: Stable   Ronny Bacon, M.D., [facility]  10/24/2020 ; 3:38 PM

## 2020-10-24 NOTE — Transfer of Care (Signed)
Immediate Anesthesia Transfer of Care Note  Patient: Lorraine Holmes  Procedure(s) Performed: BREAST LUMPECTOMY WITH RADIOFREQUENCY TAG IDENTIFICATION (Right Breast)  Patient Location: PACU  Anesthesia Type:General  Level of Consciousness: awake and alert   Airway & Oxygen Therapy: Patient Spontanous Breathing and Patient connected to face mask oxygen  Post-op Assessment: Report given to RN and Post -op Vital signs reviewed and stable  Post vital signs: Reviewed and stable  Last Vitals:  Vitals Value Taken Time  BP 93/59 10/24/20 1545  Temp 36.7 C 10/24/20 1543  Pulse 91 10/24/20 1550  Resp 16 10/24/20 1550  SpO2 100 % 10/24/20 1550  Vitals shown include unvalidated device data.  Last Pain:  Vitals:   10/24/20 1242  TempSrc: Temporal  PainSc: 0-No pain         Complications: No complications documented.

## 2020-10-24 NOTE — Discharge Instructions (Signed)

## 2020-10-24 NOTE — Anesthesia Preprocedure Evaluation (Signed)
Anesthesia Evaluation  Patient identified by MRN, date of birth, ID band Patient awake    Reviewed: Allergy & Precautions, NPO status , Patient's Chart, lab work & pertinent test results  History of Anesthesia Complications Negative for: history of anesthetic complications  Airway Mallampati: II       Dental   Pulmonary neg sleep apnea, neg COPD, Not current smoker,           Cardiovascular hypertension, Pt. on medications (-) Past MI and (-) CHF (-) dysrhythmias (-) Valvular Problems/Murmurs     Neuro/Psych neg Seizures    GI/Hepatic Neg liver ROS, neg GERD  ,  Endo/Other  neg diabetes  Renal/GU negative Renal ROS     Musculoskeletal   Abdominal   Peds  Hematology  (+) anemia ,   Anesthesia Other Findings   Reproductive/Obstetrics                             Anesthesia Physical Anesthesia Plan  ASA: II  Anesthesia Plan:    Post-op Pain Management:    Induction: Intravenous  PONV Risk Score and Plan: 2 and Propofol infusion and TIVA  Airway Management Planned: LMA  Additional Equipment:   Intra-op Plan:   Post-operative Plan:   Informed Consent: I have reviewed the patients History and Physical, chart, labs and discussed the procedure including the risks, benefits and alternatives for the proposed anesthesia with the patient or authorized representative who has indicated his/her understanding and acceptance.       Plan Discussed with:   Anesthesia Plan Comments:         Anesthesia Quick Evaluation

## 2020-10-24 NOTE — Anesthesia Procedure Notes (Signed)
Procedure Name: LMA Insertion Date/Time: 10/24/2020 2:07 PM Performed by: Allean Found, CRNA Pre-anesthesia Checklist: Patient identified, Patient being monitored, Timeout performed, Emergency Drugs available and Suction available Patient Re-evaluated:Patient Re-evaluated prior to induction Oxygen Delivery Method: Circle system utilized Preoxygenation: Pre-oxygenation with 100% oxygen Induction Type: IV induction Ventilation: Mask ventilation without difficulty LMA: LMA inserted LMA Size: 4.0 Tube type: Oral Number of attempts: 1 Placement Confirmation: positive ETCO2 and breath sounds checked- equal and bilateral Tube secured with: Tape Dental Injury: Teeth and Oropharynx as per pre-operative assessment

## 2020-10-24 NOTE — Interval H&P Note (Signed)
History and Physical Interval Note:  10/24/2020 1:46 PM  Lorraine Holmes  has presented today for surgery, with the diagnosis of DCIS right breast.  The various methods of treatment have been discussed with the patient and family. After consideration of risks, benefits and other options for treatment, the patient has consented to  Procedure(s): BREAST LUMPECTOMY WITH RADIOFREQUENCY TAG IDENTIFICATION (Right) as a surgical intervention.  The patient's history has been reviewed, patient examined, no change in status, stable for surgery.  I have reviewed the patient's chart and labs.  Questions were answered to the patient's satisfaction.    Right side is marked correct.   Ronny Bacon

## 2020-10-24 NOTE — Anesthesia Postprocedure Evaluation (Signed)
Anesthesia Post Note  Patient: Kyley D Pietrzyk  Procedure(s) Performed: BREAST LUMPECTOMY WITH RADIOFREQUENCY TAG IDENTIFICATION (Right Breast)  Patient location during evaluation: PACU Anesthesia Type: General Level of consciousness: awake and alert Pain management: pain level controlled Vital Signs Assessment: post-procedure vital signs reviewed and stable Respiratory status: spontaneous breathing, nonlabored ventilation, respiratory function stable and patient connected to nasal cannula oxygen Cardiovascular status: blood pressure returned to baseline and stable Postop Assessment: no apparent nausea or vomiting Anesthetic complications: no   No complications documented.   Last Vitals:  Vitals:   10/24/20 1628 10/24/20 1645  BP: 122/75 120/60  Pulse: 80 82  Resp: 17 18  Temp:  36.6 C  SpO2: 100% 97%    Last Pain:  Vitals:   10/24/20 1645  TempSrc: Temporal  PainSc: 0-No pain                 Martha Clan

## 2020-10-25 ENCOUNTER — Encounter: Payer: Self-pay | Admitting: Surgery

## 2020-10-30 ENCOUNTER — Other Ambulatory Visit: Payer: Self-pay | Admitting: Anatomic Pathology & Clinical Pathology

## 2020-10-30 LAB — SURGICAL PATHOLOGY

## 2020-11-01 ENCOUNTER — Ambulatory Visit (INDEPENDENT_AMBULATORY_CARE_PROVIDER_SITE_OTHER): Payer: 59 | Admitting: Surgery

## 2020-11-01 ENCOUNTER — Encounter: Payer: Self-pay | Admitting: *Deleted

## 2020-11-01 ENCOUNTER — Encounter: Payer: Self-pay | Admitting: Surgery

## 2020-11-01 ENCOUNTER — Other Ambulatory Visit: Payer: Self-pay

## 2020-11-01 VITALS — BP 147/88 | HR 88 | Temp 98.0°F | Ht 65.0 in | Wt 286.0 lb

## 2020-11-01 DIAGNOSIS — D0511 Intraductal carcinoma in situ of right breast: Secondary | ICD-10-CM

## 2020-11-01 NOTE — Progress Notes (Signed)
[facility] POST-OP OFFICE VISIT  11/01/2020  HPI: Lorraine Holmes is a 50 y.o. female 8 days s/p RF tagged localization right breast lumpectomy.   She has no complaints of pain, just some mild tenderness and itchiness at the incision site.  She had a bit of bruising which has diminished remarkably over the last few days. Vital signs: Ht 5\' 5"  (1.651 m)   Wt 286 lb (129.7 kg)   BMI 47.59 kg/m    Physical Exam: Constitutional: She looks great. Right breast without dimpling, or mass-effect.  No evidence of hematoma or seroma. Skin: Clean, dry and intact.  Resolving ecchymosis at dependent areas.  SURGICAL PATHOLOGY  * THIS IS AN ADDENDUM REPORT *  CASE: [id number]  PATIENT: Lorraine Holmes  Surgical Pathology Report  *Addendum *   Reason for Addendum #1: Breast Biomarker Results   Specimen Submitted:  A. Breast, right  B. Breast, right, additional superior margin   Clinical History: DCIS right breast     DIAGNOSIS:  A. BREAST, RIGHT; EXCISION:  - DUCTAL CARCINOMA IN SITU, HIGH-GRADE, WITH COMEDO NECROSIS.  - CLIP, RF TAG, AND BIOPSY SITE IDENTIFIED.  - NEGATIVE FOR INVASIVE CARCINOMA.  - INCIDENTAL RADIAL SCAR.  - BACKGROUND FIBROCYSTIC, FIBROADENOMATOID, AND APOCRINE CHANGES.  - CALCIFICATIONS ASSOCIATED WITH BENIGN AND NEOPLASTIC MAMMARY ELEMENTS.  - SEE CANCER SUMMARY.   B. BREAST, RIGHT, ADDITIONAL SUPERIOR MARGIN; EXCISION:  - BENIGN MAMMARY PARENCHYMA WITH FIBROCYSTIC AND FIBROADENOMATOID  CHANGES, AND FOCAL USUAL DUCTAL HYPERPLASIA.  - CALCIFICATIONS ASSOCIATED WITH BENIGN MAMMARY ELEMENTS.  - NEGATIVE FOR DUCTAL CARCINOMA IN SITU AND MALIGNANCY.   Comment:  Immunohistochemical testing for myoepithelial markers was performed on  one selected tissue block, and demonstrates an intact myoepithelial  layer in the tissue of interest. There is no evidence of invasive  carcinoma.   CANCER CASE SUMMARY: DUCTAL CARCINOMA IN SITU OF THE BREAST   Standard(s): AJCC-UICC 8   SPECIMEN  Procedure: Excision  Specimen Laterality: Right   TUMOR  Histologic Type: Ductal carcinoma in situ  Size (Extent) of DCIS: at least 8 mm  Nuclear Grade: Grade III (high)  Necrosis: Present, central (expansive comedonecrosis)   MARGINS  Margin Status: All margins negative for DCIS            Distance from DCIS to closest margin: 5 mm            Specify closest margin: Posterior/deep   REGIONAL LYMPH NODES  Regional Lymph node Status: Not applicable (no regional lymph nodes  submitted or found)   DISTANT METASTASIS  Distant Site(s) Involved, if applicable (select all that apply): Not  applicable   PATHOLOGIC STAGE CLASSIFICATION (pTNM, AJCC 8th Edition)  TNM Descriptors: Not applicable  pTis (DCIS)  Regional Lymph Nodes Modifier: Not applicable  pN not assigned (no nodes submitted or found)  pM - Not applicable   SPECIAL STUDIES  Ancillary estrogen receptor (ER) testing will be performed and reported  as an addendum.   GROSS DESCRIPTION:  A. Labeled: Right breast lumpectomy  Received: Fresh  Specimen radiograph image(s) available for review  Radiographic findings: A clip and RF ID tag are identified. The RF ID  tag is not present within the specimen and is freely floating within the  container.  Time in fixative: Collected at 2:48 PM on 10/24/2020 and placed in  formalin at 3:07 PM on 10/24/2020  Cold ischemic time: Less than 30 minutes  Total fixation time: 26.25 hours  Type of procedure: Breast lumpectomy  Location / laterality of specimen: Right breast  Orientation of specimen: The specimen is received inked.  Inking:  Anterior = green  Inferior = blue  Lateral = orange  Medial = yellow  Posterior = black  Superior = red  Size of specimen: 9.4 (anterior to posterior) x 7.5 (medial to lateral)  x 2.2 (superior to inferior) cm  Skin: Not grossly appreciated.   Biopsy site: There is a biopsy site  with a ribbon shaped clip.  Number of discrete masses: None grossly appreciated  Description of remainder of tissue: The clip site is surrounded by  tan-white firm fibrous tissue, 0.9 x 0.9 x 0.5 cm. The clip site is 0.6  cm to the posterior margin, 1 cm to the superior margin, 0.9 cm to the  lateral margin, 1.1 cm to the medial margin, 1.8 cm to the inferior  margin, and 3.4 cm to the anterior margin. Additionally adjacent to the  clip site is a 0.5 x 0.5 x 0.5 cm unilocular cyst. The cyst contains  clear contents and has smooth walls. The remainder of the specimen is  comprised of yellow lobulated adipose tissue admixed with tan-white firm  fibrous tissue. The fat to fibrous tissue ratio is 70:30.   Block summary (The entirety of the clip site and surrounding fibrous  tissue is submitted. Approximately 70% of the specimen remains.):  1 - 3 - superior medial margin, perpendicularly sectioned  4 - 6 - grossly normal breast tissue adjacent to area of fibrous tissue  surrounding clip  7 - 16 - clip site with surrounding fibrous tissue, submitted entirely  and sequentially    7 - closest lateral margin to clip site and fibrous tissue    8, 11, 14, 16 - area of fibrous tissue       11 - clip site    9 - closest medial margin to clip site    10 - additional closest lateral margin to clip site    11, 14 - closest posterior margins to clip site    12 - closest superior margin to clip site    14, 16 - cyst  17 - 19 - grossly normal breast tissue adjacent to area of fibrous  tissue surrounding clip  20 - additional closest medial and superior margins to clip site  21 - closest inferior margins to clip site  22 - closest anterior margins to clip site  23 - 25 - anterior margin, perpendicularly sectioned   B. Labeled: Right breast lumpectomy additionalsuperior margin  Received: Formalin  Specimen radiograph image(s) available for review  Radiographic  findings: Not provided  Time in fixative: Collected at 3:07 PM on 10/24/2020 and placed in  formalin at 3:13 PM on 10/24/2020  Cold ischemic time: Less than 10 minutes  Total fixation time: 26 hours  Type of procedure: Breast lobectomy  Location / laterality of specimen: Right breast  Orientation of specimen: The specimen is received inked red on the  presumed new superior margin. The opposing aspect is inked blue at time  of grossing.  Inking:  Inferior = blue  Superior = red  Size of specimen: 3.7 x 3.1 x 0.8 cm  Skin: None grossly appreciated.   Biopsy site: None grossly appreciated.   Number of discrete masses: None grossly appreciated.  Description of tissue: Sectioning reveals tan-white firm fibrous tissue  admixed with yellow lobulated adipose tissue. There are multiple  unilocular cysts ranging from 0.1 to 0.3 cm in greatest dimension. The  fat to fibrous tissue ratio is 20:80. No distinct masses or lesions are  grossly identified.   Block summary (The specimen is submitted entirely.):  1 - 2 - resection margin A, perpendicularly sectioned  3 - 8 - central sections, submitted sequentially from resection margin A  to resection margin B    7 - 8 - area with cysts  9 - 10 - resection margin B with cysts, perpendicularly sectioned   RB 10/25/2020   Final Diagnosis performed by Allena Napoleon, MD.  Electronically signed  10/29/2020 1:17:36PM  The electronic signature indicates that the named Attending Pathologist  has evaluated the specimen  Technical component performed at [facility], [address] [place],  [place] [address] Lab: [phone] Dir: Rush Farmer, MD, MMM  Professional component performed at [facility], [facility], [place], [place], [place] [address] Lab: [phone]  Dir: Dellia Nims. Rubinas, MD   ADDENDUM:  CASE SUMMARY: BREAST BIOMARKER TESTS  TEST(S) PERFORMED:  Estrogen Receptor (ER) Status: POSITIVE      Percentage of  cells with nuclear positivity: Greater than 90%      Average intensity of staining: Strong   Cold Ischemia and Fixation Times: Meet requirements specified in latest  version of the ASCO/CAP guidelines   Assessment/Plan: This is a 50 y.o. female 8 days s/p RF ID localized right breast lumpectomy for DCIS.  ER positive.  Margins negative.  Patient Active Problem List   Diagnosis Date Noted  . Ductal carcinoma in situ (DCIS) of right breast 10/05/2020  . Chronic pain of both knees 03/24/2018  . Obesity 02/26/2017  . HTN (hypertension) 02/26/2017  . Routine general medical examination at a health care facility 12/21/2013    -She felt very strongly about transferring her care to a different oncologist.  -I have made a referral to radiation oncology as well.  -I will anticipate following her up after her semiannual diagnostic mammography.  I will be glad to see her back as needed.   Ronny Bacon M.D., FACS 11/01/2020, 9:34 AM

## 2020-11-01 NOTE — Patient Instructions (Addendum)
Please give our office a call if you have any questions or concerns.  We will send you a referral to Dr Donella Stade and Dr Grayland Ormond, They will give you call to make an appointment.    We will see you back in 6 months for follow up mammogram. We will give you a call to make that appointment.

## 2020-11-08 ENCOUNTER — Telehealth: Payer: Self-pay | Admitting: *Deleted

## 2020-11-08 NOTE — Telephone Encounter (Signed)
Faxed FMLA to [phone] and STD to [employer] at [phone]

## 2020-11-19 ENCOUNTER — Encounter: Payer: Self-pay | Admitting: *Deleted

## 2020-11-19 ENCOUNTER — Telehealth: Payer: Self-pay | Admitting: Surgery

## 2020-11-19 NOTE — Telephone Encounter (Signed)
Incoming call from the patient.  She had breast lumpectomy done on 10/24/20 and has already had her post op.  She said that she has been waiting for oncology to call her for consult and treatment, but hasn't heard from anyone yet.  Please look into and call patient.  Thank you.

## 2020-11-19 NOTE — Telephone Encounter (Signed)
I have Oncology involved-they will be reaching out to the patient to schedule the appointment.

## 2020-11-20 ENCOUNTER — Telehealth: Payer: Self-pay | Admitting: Oncology

## 2020-11-20 ENCOUNTER — Encounter: Payer: Self-pay | Admitting: *Deleted

## 2020-11-20 NOTE — Progress Notes (Signed)
Informed patient of her consult with Dr. Baruch Gouty on Friday, Nov 23, 2020 @ 10:30.  Informed patient that Dr. Grayland Ormond is not taking any new patients at this time.  States she would like to see a female.  Informed patient I will call her back when I have that appointment scheduled for her.

## 2020-11-20 NOTE — Telephone Encounter (Signed)
Per Lorraine Holmes has requested to switch from Dr. Tasia Catchings to Dr. Janese Banks and needs to be seen early next week (post op).  Spoke with patient and confirmed appt on 5/24 at 9:45am.

## 2020-11-22 ENCOUNTER — Ambulatory Visit: Payer: 59 | Admitting: Oncology

## 2020-11-23 ENCOUNTER — Other Ambulatory Visit: Payer: Self-pay

## 2020-11-23 ENCOUNTER — Ambulatory Visit
Admission: RE | Admit: 2020-11-23 | Discharge: 2020-11-23 | Disposition: A | Discharge status: Home / Self Care | Payer: 59 | Source: Ambulatory Visit | Attending: Radiation Oncology | Admitting: Radiation Oncology

## 2020-11-23 ENCOUNTER — Encounter: Payer: Self-pay | Admitting: Radiation Oncology

## 2020-11-23 VITALS — BP 125/81 | HR 84 | Temp 97.0°F | Resp 16 | Wt 288.8 lb

## 2020-11-23 DIAGNOSIS — D0511 Intraductal carcinoma in situ of right breast: Secondary | ICD-10-CM | POA: Diagnosis present

## 2020-11-23 DIAGNOSIS — Z79899 Other long term (current) drug therapy: Secondary | ICD-10-CM | POA: Insufficient documentation

## 2020-11-23 DIAGNOSIS — Z170 Estrogen receptor positive status [ER+]: Secondary | ICD-10-CM | POA: Diagnosis not present

## 2020-11-23 DIAGNOSIS — Z803 Family history of malignant neoplasm of breast: Secondary | ICD-10-CM | POA: Insufficient documentation

## 2020-11-23 DIAGNOSIS — D649 Anemia, unspecified: Secondary | ICD-10-CM | POA: Insufficient documentation

## 2020-11-23 DIAGNOSIS — I10 Essential (primary) hypertension: Secondary | ICD-10-CM | POA: Diagnosis not present

## 2020-11-23 DIAGNOSIS — E669 Obesity, unspecified: Secondary | ICD-10-CM | POA: Insufficient documentation

## 2020-11-23 NOTE — Consult Note (Signed)
NEW PATIENT EVALUATION  Name: Lorraine Holmes  MRN: [id number]  Date:   11/23/2020     DOB: 06-03-1971   This 50 y.o. female patient presents to the clinic for initial evaluation of ER positive ductal carcinoma in situ stage 0 (Tis N0 M0) status post wide local excision.  REFERRING PHYSICIAN: Burnard Hawthorne, FNP  CHIEF COMPLAINT:  Chief Complaint  Patient presents with  . Breast Cancer    Initial consultation    DIAGNOSIS: The encounter diagnosis was Ductal carcinoma in situ (DCIS) of right breast.   PREVIOUS INVESTIGATIONS:  Mammogram and ultrasound reviewed Clinical notes reviewed Pathology report reviewed  HPI: [name] who presented with abnormal screening mammogram.  Mammogram showed a 0.8 cm area of heterogeneous calcifications in the upper outer right breast which prompted biopsy.  Biopsy was positive for ductal carcinoma in situ high-grade with comedonecrosis.  She went on to have a wide local excision for 8 mm area of again high-grade expansive comedonecrosis ductal carcinoma in situ nuclear grade 3.  Margins were clear at 5 mm.  No lymph nodes were submitted.  Patient is tolerated treatments well she has been slightly delayed in referral to radiation oncology although she is seen today for evaluation.  She is doing well.  She specifically denies breast tenderness cough or bone pain.  She has extremely large pendulous breasts.  PLANNED TREATMENT REGIMEN: Right whole breast radiation  PAST MEDICAL HISTORY:  has a past medical history of Anemia, Arthritis, Breast cancer ([place]), Ductal carcinoma in situ (DCIS) of right breast (10/05/2020), History of blood transfusion, History of chicken pox, Hypertension, and Obesity.    PAST SURGICAL HISTORY:  Past Surgical History:  Procedure Laterality Date  . BREAST BIOPSY Right 09/27/2020   stereo bx, ribbon clip, path pending   . BREAST LUMPECTOMY WITH RADIOFREQUENCY TAG IDENTIFICATION Right 10/24/2020   Procedure:  BREAST LUMPECTOMY WITH RADIOFREQUENCY TAG IDENTIFICATION;  Surgeon: Ronny Bacon, MD;  Location: [facility] ORS;  Service: General;  Laterality: Right;  . COLONOSCOPY WITH PROPOFOL N/A 07/18/2019   Procedure: COLONOSCOPY WITH PROPOFOL;  Surgeon: Lin Landsman, MD;  Location: [facility] ENDOSCOPY;  Service: Gastroenterology;  Laterality: N/A;  . FOOT FRACTURE SURGERY Left    2002.  metal in toe  . JOINT REPLACEMENT    . KNEE ARTHROSCOPY WITH MEDIAL MENISECTOMY Left 11/28/2014   Procedure: KNEE ARTHROSCOPY WITH MEDIAL MENISECTOMY;  Surgeon: Hessie Knows, MD;  Location: [facility] ORS;  Service: Orthopedics;  Laterality: Left;  Marland Kitchen MEDIAL PARTIAL KNEE REPLACEMENT Right 2013  . meniscus tear Right 2012    FAMILY HISTORY: family history includes Breast cancer in her maternal aunt; Diabetes in her maternal grandmother and mother; Heart disease (age of onset: 38) in her father; Hypertension in her father and mother.  SOCIAL HISTORY:  reports that she has never smoked. She has never used smokeless tobacco. She reports current alcohol use of about 2.0 standard drinks of alcohol per week. She reports that she does not use drugs.  ALLERGIES: No healthtouch food allergies  MEDICATIONS:  Current Outpatient Medications  Medication Sig Dispense Refill  . amLODipine (NORVASC) 10 MG tablet Take 1 tablet (10 mg total) by mouth daily. (Patient taking differently: Take 10 mg by mouth daily. Takes in the evening) 90 tablet 1  . cetirizine (ZYRTEC) 10 MG tablet Take 10 mg by mouth daily.    . cholecalciferol (VITAMIN D3) 25 MCG (1000 UNIT) tablet Take 1,000 Units by mouth daily.    Marland Kitchen levonorgestrel (  MIRENA) 20 MCG/24HR IUD 1 each by Intrauterine route once.    . meloxicam (MOBIC) 7.5 MG tablet TAKE 1 TABLET BY MOUTH DAILY AS NEEDED FOR PAIN (Patient taking differently: Take 7.5 mg by mouth daily as needed for pain.) 30 tablet 1  . HYDROcodone-acetaminophen (NORCO/VICODIN) 5-325 MG tablet Take 1 tablet by mouth every 6 (six)  hours as needed for moderate pain. (Patient not taking: No sig reported) 15 tablet 0   No current facility-administered medications for this encounter.    ECOG PERFORMANCE STATUS:  0 - Asymptomatic  REVIEW OF SYSTEMS: Patient denies any weight loss, fatigue, weakness, fever, chills or night sweats. Patient denies any loss of vision, blurred vision. Patient denies any ringing  of the ears or hearing loss. No irregular heartbeat. Patient denies heart murmur or history of fainting. Patient denies any chest pain or pain radiating to her upper extremities. Patient denies any shortness of breath, difficulty breathing at night, cough or hemoptysis. Patient denies any swelling in the lower legs. Patient denies any nausea vomiting, vomiting of blood, or coffee ground material in the vomitus. Patient denies any stomach pain. Patient states has had normal bowel movements no significant constipation or diarrhea. Patient denies any dysuria, hematuria or significant nocturia. Patient denies any problems walking, swelling in the joints or loss of balance. Patient denies any skin changes, loss of hair or loss of weight. Patient denies any excessive worrying or anxiety or significant depression. Patient denies any problems with insomnia. Patient denies excessive thirst, polyuria, polydipsia. Patient denies any swollen glands, patient denies easy bruising or easy bleeding. Patient denies any recent infections, allergies or URI. Patient "s visual fields have not changed significantly in recent time.   PHYSICAL EXAM: BP 125/81 (BP Location: Left Arm, Patient Position: Sitting)   Pulse 84   Temp (!) 97 F (36.1 C) (Tympanic)   Resp 16   Wt 288 lb 12.8 oz (131 kg)   BMI 48.06 kg/m  Patient is status post wide local excision in the upper outer quadrant of the right breast which is healed well.  No dominant masses noted in either breast.  No axillary or supraclavicular adenopathy is appreciated.  Her breasts are  extremely large and pendulous.  Well-developed well-nourished patient in NAD. HEENT reveals PERLA, EOMI, discs not visualized.  Oral cavity is clear. No oral mucosal lesions are identified. Neck is clear without evidence of cervical or supraclavicular adenopathy. Lungs are clear to A&P. Cardiac examination is essentially unremarkable with regular rate and rhythm without murmur rub or thrill. Abdomen is benign with no organomegaly or masses noted. Motor sensory and DTR levels are equal and symmetric in the upper and lower extremities. Cranial nerves II through XII are grossly intact. Proprioception is intact. No peripheral adenopathy or edema is identified. No motor or sensory levels are noted. Crude visual fields are within normal range.  LABORATORY DATA: Pathology report reviewed    RADIOLOGY RESULTS: Mammograms reviewed compatible with above-stated findings   IMPRESSION: Ductal carcinoma in situ ER positive of the right breast in 50 year old female  PLAN: This time of recommended whole breast radiation her breasts are too large for hypofractionated course of treatment.  Would plan on delivering 5040 cGy in 28 fractions to her right breast.  I would also boost her scar another 1000 cGy using electron beam.  Risks and benefits of treatment including skin reaction fatigue alteration of blood counts possible inclusion of superficial lung all were described in detail to the patient.  She  seems to comprehend my treatment plan well.  I have personally set up and ordered CT simulation for first thing next week.  I would like to take this opportunity to thank you for allowing me to participate in the care of your patient.Noreene Filbert, MD

## 2020-11-26 ENCOUNTER — Ambulatory Visit: Payer: 59 | Attending: Radiation Oncology

## 2020-11-26 DIAGNOSIS — D0511 Intraductal carcinoma in situ of right breast: Secondary | ICD-10-CM | POA: Diagnosis present

## 2020-11-27 ENCOUNTER — Inpatient Hospital Stay: Payer: 59 | Attending: Oncology | Admitting: Oncology

## 2020-11-27 ENCOUNTER — Inpatient Hospital Stay: Payer: 59

## 2020-11-27 ENCOUNTER — Encounter: Payer: Self-pay | Admitting: Oncology

## 2020-11-27 ENCOUNTER — Other Ambulatory Visit: Payer: Self-pay

## 2020-11-27 VITALS — BP 126/68 | HR 84 | Temp 98.7°F | Resp 16 | Ht 65.0 in | Wt 287.0 lb

## 2020-11-27 DIAGNOSIS — D0511 Intraductal carcinoma in situ of right breast: Secondary | ICD-10-CM | POA: Insufficient documentation

## 2020-11-27 DIAGNOSIS — Z803 Family history of malignant neoplasm of breast: Secondary | ICD-10-CM | POA: Diagnosis not present

## 2020-11-27 NOTE — Progress Notes (Signed)
Pt here for breast cancer after her surgery, she has seen dr Baruch Gouty yest. And will start chemo next wed.

## 2020-11-27 NOTE — Progress Notes (Signed)
Hematology/Oncology Consult note [facility]  Telephone:([phone] Fax:([phone]  Patient Care Team: Lorraine Hawthorne, FNP as PCP - General (Family Medicine) Lorraine Guadeloupe, MD as Consulting Physician (Oncology)   Name of the patient: Lorraine Holmes  [id number]  [birth year]   Date of visit: 11/27/20  Diagnosis-right breast DCIS ER positive  Chief complaint/ Reason for visit-discuss final pathology results and further management  Heme/Onc history: [name] who was found to have calcifications in the right breast on a screening mammogram that was followed by diagnostic mammogram and ultrasound.  Showed 0.8 cm of indeterminate calcification which was biopsied and consistent with high-grade DCIS comedo type.  She underwent Lumpectomy which showed high-grade DCIS which was negative for any invasive carcinoma.  Margins negative with 2 mm ER positive pTis.  Patient has met with radiation oncology and will be going for adjuvant radiation treatment as well.  She has a Mirena in place for the last 7 years and has not had any menstrual cycles on Mirena.  She will be meeting with GYN to discuss her birth control options.  Interval history-currently patient is doing well and denies any specific complaints at this time  ECOG PS- 0 Pain scale- 0   Review of systems- Review of Systems  Constitutional: Negative for chills, fever, malaise/fatigue and weight loss.  HENT: Negative for congestion, ear discharge and nosebleeds.   Eyes: Negative for blurred vision.  Respiratory: Negative for cough, hemoptysis, sputum production, shortness of breath and wheezing.   Cardiovascular: Negative for chest pain, palpitations, orthopnea and claudication.  Gastrointestinal: Negative for abdominal pain, blood in stool, constipation, diarrhea, heartburn, melena, nausea and vomiting.  Genitourinary: Negative for dysuria, flank pain, frequency, hematuria and  urgency.  Musculoskeletal: Negative for back pain, joint pain and myalgias.  Skin: Negative for rash.  Neurological: Negative for dizziness, tingling, focal weakness, seizures, weakness and headaches.  Endo/Heme/Allergies: Does not bruise/bleed easily.  Psychiatric/Behavioral: Negative for depression and suicidal ideas. The patient does not have insomnia.      Allergies  Allergen Reactions  . No Healthtouch Food Allergies Hives    Many food allergies.  Takes zyrtec for hives everyday     Past Medical History:  Diagnosis Date  . Anemia   . Arthritis    Bilateral knee  . Breast cancer ([place])    Ductal carcinoma in situ (DCIS) of right breast  . Ductal carcinoma in situ (DCIS) of right breast 10/05/2020  . History of blood transfusion   . History of chicken pox   . Hypertension   . Obesity    bmi 44     Past Surgical History:  Procedure Laterality Date  . BREAST BIOPSY Right 09/27/2020   stereo bx, ribbon clip, path pending   . BREAST LUMPECTOMY WITH RADIOFREQUENCY TAG IDENTIFICATION Right 10/24/2020   Procedure: BREAST LUMPECTOMY WITH RADIOFREQUENCY TAG IDENTIFICATION;  Surgeon: Lorraine Bacon, MD;  Location: [facility] ORS;  Service: General;  Laterality: Right;  . COLONOSCOPY WITH PROPOFOL N/A 07/18/2019   Procedure: COLONOSCOPY WITH PROPOFOL;  Surgeon: Lorraine Landsman, MD;  Location: [facility] ENDOSCOPY;  Service: Gastroenterology;  Laterality: N/A;  . FOOT FRACTURE SURGERY Left    2002.  metal in toe  . JOINT REPLACEMENT    . KNEE ARTHROSCOPY WITH MEDIAL MENISECTOMY Left 11/28/2014   Procedure: KNEE ARTHROSCOPY WITH MEDIAL MENISECTOMY;  Surgeon: Lorraine Knows, MD;  Location: [facility] ORS;  Service: Orthopedics;  Laterality: Left;  Marland Kitchen MEDIAL PARTIAL KNEE REPLACEMENT  Right 2013  . meniscus tear Right 2012    Social History   Socioeconomic History  . Marital status: Single    Spouse name: Not on file  . Number of children: 2  . Years of education: 69  . Highest education level:  Not on file  Occupational History  . 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 Smoking status: Never Smoker  . Smokeless tobacco: Never Used  Vaping Use  . Vaping Use: Never used  Substance and Sexual Activity  . Alcohol use: Yes    Alcohol/week: 2.0 standard drinks    Types: 2 Standard drinks or equivalent per week    Comment: occ  . Drug use: No  . Sexual activity: Yes    Birth control/protection: I.U.D.    Comment: mirena  Other Topics Concern  . Not on file  Social History Narrative   Lorraine Holmes grew up in [place]. She lives at home with her children and her mother. 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       Working [employer] in [place].       Works from home      Lorraine Holmes: Not on [employer] Insecurity: Not on file  Transportation Needs: Not on file  Physical Activity: Not on file  Stress: Not on file  Social Connections: Not on file  Intimate Partner Violence: Not on file    Family History  Problem Relation Age of Onset  . Hypertension Mother   . Diabetes Mother   . Hypertension Father   . Heart disease Father 45       CAD - died MI  . Diabetes Maternal Grandmother   . Breast cancer Maternal Aunt        great aunt  . Colon cancer Neg Hx   . Thyroid cancer Neg Hx      Current Outpatient Medications:  .  amLODipine (NORVASC) 10 MG tablet, Take 1 tablet (10 mg total) by mouth daily. (Patient taking differently: Take 10 mg by mouth daily. Takes in the evening), Disp: 90 tablet, Rfl: 1 .  cetirizine (ZYRTEC) 10 MG tablet, Take 10 mg by mouth daily., Disp: , Rfl:  .  cholecalciferol (VITAMIN D3) 25 MCG (1000 UNIT) tablet, Take 1,000 Units by mouth daily., Disp: , Rfl:  .  levonorgestrel (MIRENA) 20 MCG/24HR IUD, 1 each by Intrauterine route once., Disp: , Rfl:  .  meloxicam (MOBIC) 7.5 MG  tablet, TAKE 1 TABLET BY MOUTH DAILY AS NEEDED FOR PAIN (Patient taking differently: Take 7.5 mg by mouth daily as needed for pain.), Disp: 30 tablet, Rfl: 1 .  HYDROcodone-acetaminophen (NORCO/VICODIN) 5-325 MG tablet, Take 1 tablet by mouth every 6 (six) hours as needed for moderate pain. (Patient not taking: No sig reported), Disp: 15 tablet, Rfl: 0  Physical exam:  Vitals:   11/27/20 1125  BP: 126/68  Pulse: 84  Resp: 16  Temp: 98.7 F (37.1 C)  TempSrc: Oral  Weight: 287 lb (130.2 kg)  Height: 5' 5" (1.651 m)   Physical Exam Constitutional:      General: She is not in acute distress. Cardiovascular:     Rate and Rhythm: Normal rate and regular rhythm.     Heart sounds: Normal heart sounds.  Pulmonary:     Effort: Pulmonary effort is normal.  Breath sounds: Normal breath sounds.  Skin:    General: Skin is warm and dry.  Neurological:     Mental Status: She is alert and oriented to person, place, and time.      CMP Latest Ref Rng & Units 10/22/2020  Glucose 70 - 99 mg/dL 98  BUN 6 - 20 mg/dL 12  Creatinine 0.44 - 1.00 mg/dL 0.82  Sodium 135 - 145 mmol/L 139  Potassium 3.5 - 5.1 mmol/L 3.8  Chloride 98 - 111 mmol/L 105  CO2 22 - 32 mmol/L 24  Calcium 8.9 - 10.3 mg/dL 9.5  Total Protein 6.5 - 8.1 g/dL 7.7  Total Bilirubin 0.3 - 1.2 mg/dL 0.6  Alkaline Phos 38 - 126 U/L 78  AST 15 - 41 U/L 16  ALT 0 - 44 U/L 11   CBC Latest Ref Rng & Units 10/22/2020  WBC 4.0 - 10.5 K/uL 10.4  Hemoglobin 12.0 - 15.0 g/dL 12.9  Hematocrit 36.0 - 46.0 % 40.5  Platelets 150 - 400 K/uL 254      Assessment and plan- Patient is a 50 y.o. female with right breast DCIS ER positive here to discuss further management  Discussed with the patient that following adjuvant radiation therapy for DCIS patient would benefit from 5 years of hormone therapy.  We will be checking her hormone levels today including FSH LH and estradiol to determine her menopausal status.  Patient will be meeting  with GYN to discuss her birth control option should she still be premenopausal.  Recommend switching from Mirena to copper IUD which would be an nonhormonal method of birth control if she is still premenopausal.  Discussed that for premenopausal ER positive DCIS patient's tamoxifen would be her only option which would be taken once a day for 5 years.  Discussed risks and benefits of tamoxifen including all but not limited to mood swings, hot flashes, arthralgias, risk of DVT cataracts and uterine cancer.  If her hormone levels are conclusively postmenopausal I would probably repeated 1 more time to make sure that they are consistently postmenopausal before suggesting AI's for her.  I will call the patient once the results of her hormone work-up are back from today  I will see her in 3 months following radiation and after she has been on hormone therapy for a month.  Treatment will be given with a curative intent.  I would also recommend that she should take calcium 1200 mg along with vitamin D 800 international units daily   Visit Diagnosis 1. Ductal carcinoma in situ (DCIS) of right breast      Dr. Randa Evens, MD, MPH [facility] at [facility] [phone] 11/27/2020 3:24 PM

## 2020-11-28 ENCOUNTER — Other Ambulatory Visit: Payer: Self-pay | Admitting: *Deleted

## 2020-11-28 DIAGNOSIS — D0511 Intraductal carcinoma in situ of right breast: Secondary | ICD-10-CM

## 2020-11-28 LAB — FSH/LH
FSH: 36.2 m[IU]/mL
LH: 29.4 m[IU]/mL

## 2020-11-28 LAB — ESTRADIOL: Estradiol: 10.5 pg/mL

## 2020-11-29 DIAGNOSIS — D0511 Intraductal carcinoma in situ of right breast: Secondary | ICD-10-CM | POA: Diagnosis not present

## 2020-11-30 ENCOUNTER — Ambulatory Visit (INDEPENDENT_AMBULATORY_CARE_PROVIDER_SITE_OTHER): Payer: 59 | Admitting: Obstetrics and Gynecology

## 2020-11-30 ENCOUNTER — Encounter: Payer: Self-pay | Admitting: Obstetrics and Gynecology

## 2020-11-30 ENCOUNTER — Encounter: Payer: Self-pay | Admitting: Oncology

## 2020-11-30 ENCOUNTER — Other Ambulatory Visit: Payer: Self-pay

## 2020-11-30 VITALS — BP 125/78 | HR 94 | Ht 65.0 in | Wt 291.1 lb

## 2020-11-30 DIAGNOSIS — Z30432 Encounter for removal of intrauterine contraceptive device: Secondary | ICD-10-CM

## 2020-11-30 DIAGNOSIS — D0511 Intraductal carcinoma in situ of right breast: Secondary | ICD-10-CM | POA: Diagnosis not present

## 2020-11-30 DIAGNOSIS — Z780 Asymptomatic menopausal state: Secondary | ICD-10-CM | POA: Diagnosis not present

## 2020-11-30 NOTE — Progress Notes (Signed)
GYNECOLOGY PROGRESS NOTE  Subjective:    Patient ID: Lorraine Holmes, female    DOB: 1971-01-08, 50 y.o.   MRN: [id number]  HPI Lorraine Holmes is a 50 y.o. [id number] here for possible Mirena IUD removal. Previously was a patient of Melody Trego, CNM. She reports that she was recently diagnosed with DCIS of the right breast. Has undergone lumpectomy in April, and will soon be starting radiation treatments.  After this, will begin adjuvent hormonal chemotherapy.  Notes that her oncologist suggested discussion with GYN regarding continued use of her Mirena IUD.  Had recent labs that noted that she may already have transitioned to menopause. Current IUD has been in place for ~ 2 years.  Last pap smear was on 02/23/2019 and was normal.   The following portions of the patient's history were reviewed and updated as appropriate:  She  has a past medical history of Anemia, Arthritis, Breast cancer ([place]), Breast cancer ([place]), Ductal carcinoma in situ (DCIS) of right breast (10/05/2020), History of blood transfusion, History of chicken pox, Hypertension, and Obesity.   She  has a past surgical history that includes Medial partial knee replacement (Right, 2013); meniscus tear (Right, 2012); Foot fracture surgery (Left); Knee arthroscopy with medial menisectomy (Left, 11/28/2014); Colonoscopy with propofol (N/A, 07/18/2019); Joint replacement; Breast biopsy (Right, 09/27/2020); and Breast lumpectomy with radiofrequency tag identification (Right, 10/24/2020).   Her family history includes Breast cancer in her maternal aunt; Diabetes in her maternal grandmother and mother; Heart disease (age of onset: 41) in her father; Hypertension in her father and mother.   She  reports that she has never smoked. She has never used smokeless tobacco. She reports current alcohol use of about 2.0 standard drinks of alcohol per week. She reports that she does not use drugs.   Current Outpatient Medications on File Prior to Visit   Medication Sig Dispense Refill  . amLODipine (NORVASC) 10 MG tablet Take 1 tablet (10 mg total) by mouth daily. (Patient taking differently: Take 10 mg by mouth daily. Takes in the evening) 90 tablet 1  . cetirizine (ZYRTEC) 10 MG tablet Take 10 mg by mouth daily.    . cholecalciferol (VITAMIN D3) 25 MCG (1000 UNIT) tablet Take 1,000 Units by mouth daily.    . meloxicam (MOBIC) 7.5 MG tablet TAKE 1 TABLET BY MOUTH DAILY AS NEEDED FOR PAIN (Patient taking differently: Take 7.5 mg by mouth daily as needed for pain.) 30 tablet 1   No current facility-administered medications on file prior to visit.   She is allergic to no healthtouch food allergies..  Review of Systems A comprehensive review of systems was negative except for: positive for occassional hot flashes  Objective:   Blood pressure 125/78, pulse 94, height 5\' 5"  (1.651 m), weight 291 lb 1.6 oz (132 kg). General appearance: alert and no distress Abdomen: soft, non-tender; bowel sounds normal; no masses,  no organomegaly Pelvic: external genitalia normal, rectovaginal septum normal.  Vagina without discharge.  Cervix normal appearing, no lesions and no motion tenderness.  Bimanual exam not performed.    Labs:  Appointment on 11/27/2020  Component Date Value Ref Range Status  . Estradiol 11/27/2020 10.5  pg/mL Final   Comment: (NOTE)                    Adult Female:                      Follicular  phase   12.5 -   166.0                      Ovulation phase    85.8 -   498.0                      Luteal phase       43.8 -   211.0                      Postmenopausal     <6.0 -    54.7                    Pregnancy                      1st trimester     215.0 - >4300.0 Roche ECLIA methodology Performed At: [facility] [employer] [place], [place] [id number] Rush Farmer MD KN:[id number]   . LH 11/27/2020 29.4  mIU/mL Final   Comment: (NOTE)                    Adult Female:                      Follicular phase       2.4 -  12.6                      Ovulation phase      14.0 -  95.6                      Luteal phase          1.0 -  11.4                      Postmenopausal        7.7 -  58.5   . [facility] 11/27/2020 36.2  mIU/mL Final   Comment: (NOTE)                    Adult Female:                      Follicular phase      3.5 -  12.5                      Ovulation phase       4.7 -  21.5                      Luteal phase          1.7 -   7.7                      Postmenopausal       25.8 - 134.8 Performed At: [facility] [place], [place] [id number] Rush Farmer MD ZH:[id number]      Assessment:   1. Encounter for IUD removal   2. Ductal carcinoma in situ (DCIS) of right breast   3. Menopause    Plan:   1. Reviewed labs with patient. Patient likely newly menopausal as her [place] and LH and estradiol are in menopausal range, however on the lower end. Discussed that if her IUD were removed at this time, her chances of pregnancy would still be minimal  to nonexistent. She notes that she is "not very sexually active".  If concerns still present, she could use a barrier method or other non-hormonal option for contraception on those occurrences over the next year. Patient ok to have IUD removed today.  2. Patient to notify if she begins to experience worsening hot flushes, night sweats after IUD removal and initiation of chemotherapy. Can discuss non-hormonal options.  3. DCIS of right breast managing with Oncology.   Return to clinic for any scheduled appointments or for any gynecologic concerns as needed.    IUD Removal  Patient identified, informed consent performed, consent signed.  Patient was in the dorsal lithotomy position, normal external genitalia was noted.  A speculum was placed in the patient's vagina, normal discharge was noted, no lesions. The cervix was partially visualized, no lesions, no abnormal discharge.  The strings of the IUD were grasped and pulled using  ring forceps. The IUD was removed in its entirety.  Patient tolerated the procedure well.    Patient appears to be newly menopausal but can use non-hormonal contraception for the next year if desired. Routine preventative health maintenance measures emphasized.   A total of 30 minutes were spent face-to-face with the patient during this encounter and over half of that time dealt with reviewing previous encounters, labs, and imaging, counseling and coordination of care.  Rubie Maid, MD [facility]

## 2020-12-04 ENCOUNTER — Ambulatory Visit: Payer: 59

## 2020-12-05 ENCOUNTER — Ambulatory Visit: Payer: 59

## 2020-12-05 ENCOUNTER — Ambulatory Visit: Admission: RE | Admit: 2020-12-05 | Payer: 59 | Source: Ambulatory Visit

## 2020-12-05 DIAGNOSIS — D0511 Intraductal carcinoma in situ of right breast: Secondary | ICD-10-CM | POA: Insufficient documentation

## 2020-12-06 ENCOUNTER — Ambulatory Visit
Admission: RE | Admit: 2020-12-06 | Discharge: 2020-12-06 | Disposition: A | Discharge status: Home / Self Care | Payer: 59 | Source: Ambulatory Visit | Attending: Radiation Oncology | Admitting: Radiation Oncology

## 2020-12-06 DIAGNOSIS — D0511 Intraductal carcinoma in situ of right breast: Secondary | ICD-10-CM | POA: Diagnosis not present

## 2020-12-07 ENCOUNTER — Ambulatory Visit
Admission: RE | Admit: 2020-12-07 | Discharge: 2020-12-07 | Disposition: A | Discharge status: Home / Self Care | Payer: 59 | Source: Ambulatory Visit | Attending: Radiation Oncology | Admitting: Radiation Oncology

## 2020-12-07 ENCOUNTER — Encounter: Payer: Self-pay | Admitting: *Deleted

## 2020-12-07 DIAGNOSIS — D0511 Intraductal carcinoma in situ of right breast: Secondary | ICD-10-CM | POA: Diagnosis not present

## 2020-12-10 ENCOUNTER — Ambulatory Visit
Admission: RE | Admit: 2020-12-10 | Discharge: 2020-12-10 | Disposition: A | Discharge status: Home / Self Care | Payer: 59 | Source: Ambulatory Visit | Attending: Radiation Oncology | Admitting: Radiation Oncology

## 2020-12-10 DIAGNOSIS — D0511 Intraductal carcinoma in situ of right breast: Secondary | ICD-10-CM | POA: Diagnosis not present

## 2020-12-11 ENCOUNTER — Ambulatory Visit
Admission: RE | Admit: 2020-12-11 | Discharge: 2020-12-11 | Disposition: A | Discharge status: Home / Self Care | Payer: 59 | Source: Ambulatory Visit | Attending: Radiation Oncology | Admitting: Radiation Oncology

## 2020-12-11 DIAGNOSIS — D0511 Intraductal carcinoma in situ of right breast: Secondary | ICD-10-CM | POA: Diagnosis not present

## 2020-12-12 ENCOUNTER — Ambulatory Visit
Admission: RE | Admit: 2020-12-12 | Discharge: 2020-12-12 | Disposition: A | Discharge status: Home / Self Care | Payer: 59 | Source: Ambulatory Visit | Attending: Radiation Oncology | Admitting: Radiation Oncology

## 2020-12-12 DIAGNOSIS — D0511 Intraductal carcinoma in situ of right breast: Secondary | ICD-10-CM | POA: Diagnosis not present

## 2020-12-13 ENCOUNTER — Ambulatory Visit
Admission: RE | Admit: 2020-12-13 | Discharge: 2020-12-13 | Disposition: A | Discharge status: Home / Self Care | Payer: 59 | Source: Ambulatory Visit | Attending: Radiation Oncology | Admitting: Radiation Oncology

## 2020-12-13 DIAGNOSIS — D0511 Intraductal carcinoma in situ of right breast: Secondary | ICD-10-CM | POA: Diagnosis not present

## 2020-12-14 ENCOUNTER — Ambulatory Visit: Payer: 59

## 2020-12-14 ENCOUNTER — Ambulatory Visit
Admission: RE | Admit: 2020-12-14 | Discharge: 2020-12-14 | Disposition: A | Discharge status: Home / Self Care | Payer: 59 | Source: Ambulatory Visit | Attending: Radiation Oncology | Admitting: Radiation Oncology

## 2020-12-14 DIAGNOSIS — D0511 Intraductal carcinoma in situ of right breast: Secondary | ICD-10-CM | POA: Diagnosis not present

## 2020-12-17 ENCOUNTER — Ambulatory Visit
Admission: RE | Admit: 2020-12-17 | Discharge: 2020-12-17 | Disposition: A | Discharge status: Home / Self Care | Payer: 59 | Source: Ambulatory Visit | Attending: Radiation Oncology | Admitting: Radiation Oncology

## 2020-12-17 DIAGNOSIS — D0511 Intraductal carcinoma in situ of right breast: Secondary | ICD-10-CM | POA: Diagnosis not present

## 2020-12-18 ENCOUNTER — Ambulatory Visit
Admission: RE | Admit: 2020-12-18 | Discharge: 2020-12-18 | Disposition: A | Discharge status: Home / Self Care | Payer: 59 | Source: Ambulatory Visit | Attending: Radiation Oncology | Admitting: Radiation Oncology

## 2020-12-18 DIAGNOSIS — D0511 Intraductal carcinoma in situ of right breast: Secondary | ICD-10-CM | POA: Diagnosis not present

## 2020-12-19 ENCOUNTER — Ambulatory Visit
Admission: RE | Admit: 2020-12-19 | Discharge: 2020-12-19 | Disposition: A | Discharge status: Home / Self Care | Payer: 59 | Source: Ambulatory Visit | Attending: Radiation Oncology | Admitting: Radiation Oncology

## 2020-12-19 ENCOUNTER — Other Ambulatory Visit: Payer: Self-pay

## 2020-12-19 ENCOUNTER — Inpatient Hospital Stay: Payer: 59 | Attending: Radiation Oncology

## 2020-12-19 DIAGNOSIS — D0511 Intraductal carcinoma in situ of right breast: Secondary | ICD-10-CM | POA: Diagnosis present

## 2020-12-19 LAB — CBC
HCT: 40.1 % (ref 36.0–46.0)
Hemoglobin: 12.9 g/dL (ref 12.0–15.0)
MCH: 28.5 pg (ref 26.0–34.0)
MCHC: 32.2 g/dL (ref 30.0–36.0)
MCV: 88.7 fL (ref 80.0–100.0)
Platelets: 305 10*3/uL (ref 150–400)
RBC: 4.52 MIL/uL (ref 3.87–5.11)
RDW: 14.6 % (ref 11.5–15.5)
WBC: 8.2 10*3/uL (ref 4.0–10.5)
nRBC: 0 % (ref 0.0–0.2)

## 2020-12-20 ENCOUNTER — Ambulatory Visit
Admission: RE | Admit: 2020-12-20 | Discharge: 2020-12-20 | Disposition: A | Discharge status: Home / Self Care | Payer: 59 | Source: Ambulatory Visit | Attending: Radiation Oncology | Admitting: Radiation Oncology

## 2020-12-20 DIAGNOSIS — D0511 Intraductal carcinoma in situ of right breast: Secondary | ICD-10-CM | POA: Diagnosis not present

## 2020-12-21 ENCOUNTER — Ambulatory Visit
Admission: RE | Admit: 2020-12-21 | Discharge: 2020-12-21 | Disposition: A | Discharge status: Home / Self Care | Payer: 59 | Source: Ambulatory Visit | Attending: Radiation Oncology | Admitting: Radiation Oncology

## 2020-12-21 DIAGNOSIS — D0511 Intraductal carcinoma in situ of right breast: Secondary | ICD-10-CM | POA: Diagnosis not present

## 2020-12-24 ENCOUNTER — Ambulatory Visit
Admission: RE | Admit: 2020-12-24 | Discharge: 2020-12-24 | Disposition: A | Discharge status: Home / Self Care | Payer: 59 | Source: Ambulatory Visit | Attending: Radiation Oncology | Admitting: Radiation Oncology

## 2020-12-24 DIAGNOSIS — D0511 Intraductal carcinoma in situ of right breast: Secondary | ICD-10-CM | POA: Diagnosis not present

## 2020-12-25 ENCOUNTER — Ambulatory Visit
Admission: RE | Admit: 2020-12-25 | Discharge: 2020-12-25 | Disposition: A | Discharge status: Home / Self Care | Payer: 59 | Source: Ambulatory Visit | Attending: Radiation Oncology | Admitting: Radiation Oncology

## 2020-12-25 DIAGNOSIS — D0511 Intraductal carcinoma in situ of right breast: Secondary | ICD-10-CM | POA: Diagnosis not present

## 2020-12-26 ENCOUNTER — Ambulatory Visit
Admission: RE | Admit: 2020-12-26 | Discharge: 2020-12-26 | Disposition: A | Discharge status: Home / Self Care | Payer: 59 | Source: Ambulatory Visit | Attending: Radiation Oncology | Admitting: Radiation Oncology

## 2020-12-26 DIAGNOSIS — D0511 Intraductal carcinoma in situ of right breast: Secondary | ICD-10-CM | POA: Diagnosis not present

## 2020-12-27 ENCOUNTER — Ambulatory Visit: Payer: 59

## 2020-12-27 ENCOUNTER — Ambulatory Visit
Admission: RE | Admit: 2020-12-27 | Discharge: 2020-12-27 | Disposition: A | Discharge status: Home / Self Care | Payer: 59 | Source: Ambulatory Visit | Attending: Radiation Oncology | Admitting: Radiation Oncology

## 2020-12-27 DIAGNOSIS — D0511 Intraductal carcinoma in situ of right breast: Secondary | ICD-10-CM | POA: Diagnosis not present

## 2020-12-28 ENCOUNTER — Ambulatory Visit: Payer: 59

## 2020-12-28 ENCOUNTER — Ambulatory Visit
Admission: RE | Admit: 2020-12-28 | Discharge: 2020-12-28 | Disposition: A | Discharge status: Home / Self Care | Payer: 59 | Source: Ambulatory Visit | Attending: Radiation Oncology | Admitting: Radiation Oncology

## 2020-12-28 DIAGNOSIS — D0511 Intraductal carcinoma in situ of right breast: Secondary | ICD-10-CM | POA: Diagnosis not present

## 2020-12-31 ENCOUNTER — Ambulatory Visit: Payer: 59

## 2020-12-31 ENCOUNTER — Ambulatory Visit
Admission: RE | Admit: 2020-12-31 | Discharge: 2020-12-31 | Disposition: A | Discharge status: Home / Self Care | Payer: 59 | Source: Ambulatory Visit | Attending: Radiation Oncology | Admitting: Radiation Oncology

## 2020-12-31 DIAGNOSIS — D0511 Intraductal carcinoma in situ of right breast: Secondary | ICD-10-CM | POA: Diagnosis not present

## 2021-01-01 ENCOUNTER — Ambulatory Visit
Admission: RE | Admit: 2021-01-01 | Discharge: 2021-01-01 | Disposition: A | Discharge status: Home / Self Care | Payer: 59 | Source: Ambulatory Visit | Attending: Radiation Oncology | Admitting: Radiation Oncology

## 2021-01-01 DIAGNOSIS — D0511 Intraductal carcinoma in situ of right breast: Secondary | ICD-10-CM | POA: Diagnosis not present

## 2021-01-02 ENCOUNTER — Ambulatory Visit
Admission: RE | Admit: 2021-01-02 | Discharge: 2021-01-02 | Disposition: A | Discharge status: Home / Self Care | Payer: 59 | Source: Ambulatory Visit | Attending: Radiation Oncology | Admitting: Radiation Oncology

## 2021-01-02 ENCOUNTER — Ambulatory Visit: Payer: 59

## 2021-01-02 DIAGNOSIS — D0511 Intraductal carcinoma in situ of right breast: Secondary | ICD-10-CM | POA: Diagnosis not present

## 2021-01-03 ENCOUNTER — Ambulatory Visit: Payer: 59

## 2021-01-03 ENCOUNTER — Ambulatory Visit
Admission: RE | Admit: 2021-01-03 | Discharge: 2021-01-03 | Disposition: A | Discharge status: Home / Self Care | Payer: 59 | Source: Ambulatory Visit | Attending: Radiation Oncology | Admitting: Radiation Oncology

## 2021-01-03 DIAGNOSIS — D0511 Intraductal carcinoma in situ of right breast: Secondary | ICD-10-CM | POA: Diagnosis not present

## 2021-01-04 ENCOUNTER — Ambulatory Visit: Payer: 59

## 2021-01-08 ENCOUNTER — Ambulatory Visit: Payer: 59

## 2021-01-09 ENCOUNTER — Ambulatory Visit: Payer: 59

## 2021-01-09 ENCOUNTER — Inpatient Hospital Stay: Payer: 59 | Attending: Oncology | Admitting: Oncology

## 2021-01-09 ENCOUNTER — Other Ambulatory Visit: Payer: Self-pay

## 2021-01-09 ENCOUNTER — Encounter: Payer: Self-pay | Admitting: Oncology

## 2021-01-09 VITALS — BP 137/83 | HR 80 | Temp 97.3°F | Resp 20 | Wt 290.7 lb

## 2021-01-09 DIAGNOSIS — D0511 Intraductal carcinoma in situ of right breast: Secondary | ICD-10-CM

## 2021-01-09 DIAGNOSIS — Z170 Estrogen receptor positive status [ER+]: Secondary | ICD-10-CM | POA: Insufficient documentation

## 2021-01-09 DIAGNOSIS — Z803 Family history of malignant neoplasm of breast: Secondary | ICD-10-CM | POA: Insufficient documentation

## 2021-01-09 DIAGNOSIS — Z923 Personal history of irradiation: Secondary | ICD-10-CM | POA: Insufficient documentation

## 2021-01-09 MED ORDER — TAMOXIFEN CITRATE 20 MG PO TABS: 20.0000 mg | ORAL_TABLET | Freq: Every day | ORAL | 1 refills | Status: DC

## 2021-01-09 NOTE — Progress Notes (Signed)
rt breast has a sunburn that is causing pain, has not broken the skin yet but it is sore.

## 2021-01-10 ENCOUNTER — Ambulatory Visit: Payer: 59

## 2021-01-10 NOTE — Progress Notes (Signed)
Hematology/Oncology Consult note [facility]  Telephone:([phone] [phone]  Patient Care Team: Burnard Hawthorne, FNP as PCP - General (Family Medicine) Sindy Guadeloupe, MD as Consulting Physician (Oncology)   Name of the patient: Lorraine Holmes  [id number]  [birth year]   Date of visit: 01/10/21  Diagnosis-  right breast DCIS ER positive  Chief complaint/ Reason for visit- discuss hormone therapy for breast cancer  Heme/Onc history: Patient is a 50 year old female who was found to have calcifications in the right breast on a screening mammogram that was followed by diagnostic mammogram and ultrasound.  Showed 0.8 cm of indeterminate calcification which was biopsied and consistent with high-grade DCIS comedo type.  She underwent Lumpectomy which showed high-grade DCIS which was negative for any invasive carcinoma.  Margins negative with 2 mm ER positive pTis.   Patient has met with radiation oncology and will be going for adjuvant radiation treatment as well.  She has a Mirena in place for the last 7 years and has not had any menstrual cycles on Mirena.  Mirena taken out in June 2022  Interval history-patient has completed her radiation treatment and overall tolerated it well.  She is using Aquaphor for radiation dermatitis.  Denies any new complaints at this time  ECOG PS- 0 Pain scale- 0   Review of systems- Review of Systems  Constitutional:  Negative for chills, fever, malaise/fatigue and weight loss.  HENT:  Negative for congestion, ear discharge and nosebleeds.   Eyes:  Negative for blurred vision.  Respiratory:  Negative for cough, hemoptysis, sputum production, shortness of breath and wheezing.   Cardiovascular:  Negative for chest pain, palpitations, orthopnea and claudication.  Gastrointestinal:  Negative for abdominal pain, blood in stool, constipation, diarrhea, heartburn, melena, nausea and vomiting.  Genitourinary:  Negative for  dysuria, flank pain, frequency, hematuria and urgency.  Musculoskeletal:  Negative for back pain, joint pain and myalgias.  Skin:  Negative for rash.  Neurological:  Negative for dizziness, tingling, focal weakness, seizures, weakness and headaches.  Endo/Heme/Allergies:  Does not bruise/bleed easily.  Psychiatric/Behavioral:  Negative for depression and suicidal ideas. The patient does not have insomnia.       Allergies  Allergen Reactions   No Healthtouch Food Allergies Hives    Many food allergies.  Takes zyrtec for hives everyday     Past Medical History:  Diagnosis Date   Anemia    Arthritis    Bilateral knee   Breast cancer ([place])    Ductal carcinoma in situ (DCIS) of right breast   Breast cancer ([place])    Ductal carcinoma in situ (DCIS) of right breast 10/05/2020   History of blood transfusion    History of chicken pox    Hypertension    Obesity    bmi 44     Past Surgical History:  Procedure Laterality Date   BREAST BIOPSY Right 09/27/2020   stereo bx, ribbon clip, path pending    BREAST LUMPECTOMY WITH RADIOFREQUENCY TAG IDENTIFICATION Right 10/24/2020   Procedure: BREAST LUMPECTOMY WITH RADIOFREQUENCY TAG IDENTIFICATION;  Surgeon: Ronny Bacon, MD;  Location: [facility] ORS;  Service: General;  Laterality: Right;   COLONOSCOPY WITH PROPOFOL N/A 07/18/2019   Procedure: COLONOSCOPY WITH PROPOFOL;  Surgeon: Lin Landsman, MD;  Location: [facility] ENDOSCOPY;  Service: Gastroenterology;  Laterality: N/A;   FOOT FRACTURE SURGERY Left    2002.  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 She lives at home with her children and her mother. 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       Working [employer] in [place].       Works from home      Social Determinants of Radio broadcast assistant Strain: Not on [employer] Insecurity: Not on file  Transportation Needs: Not on file  Physical Activity: Not on file  Stress: Not on file  Social Connections: Not on file  Intimate Partner Violence: Not on file    Family History  Problem Relation Age of Onset   Hypertension Mother    Diabetes Mother    Hypertension Father    Heart disease Father 72       CAD - died MI   Diabetes Maternal Grandmother    Breast cancer Maternal Aunt        great aunt   Colon cancer Neg Hx    Thyroid cancer Neg Hx      Current Outpatient Medications:    amLODipine (NORVASC) 10 MG tablet, Take 1 tablet (10 mg total) by mouth daily. (Patient taking differently: Take 10 mg by mouth daily. Takes in the evening), Disp: 90 tablet, Rfl: 1   cetirizine (ZYRTEC) 10 MG tablet, Take 10 mg by mouth daily., Disp: , Rfl:    cholecalciferol (VITAMIN D3) 25 MCG (1000 UNIT) tablet, Take 1,000 Units by mouth daily., Disp: , Rfl:    tamoxifen (NOLVADEX) 20 MG tablet, Take 1 tablet (20 mg total) by mouth daily., Disp: 30 tablet, Rfl: 1   meloxicam (MOBIC) 7.5 MG  tablet, TAKE 1 TABLET BY MOUTH DAILY AS NEEDED FOR PAIN (Patient not taking: Reported on 01/09/2021), Disp: 30 tablet, Rfl: 1  Physical exam:  Vitals:   01/09/21 1449  BP: 137/83  Pulse: 80  Resp: 20  Temp: (!) 97.3 F (36.3 C)  SpO2: 100%  Weight: 290 lb 10.8 oz (131.8 kg)   Physical Exam Cardiovascular:     Rate and Rhythm: Normal rate.  Pulmonary:     Effort: Pulmonary effort is normal.  Skin:    General: Skin is warm and dry.  Neurological:     Mental Status: She is alert and oriented to person, place, and time.  Breast exam: Evidence of radiation dermatitis noted in the inframammary aspect of the right  breast  CMP Latest Ref Rng & Units 10/22/2020  Glucose 70 - 99 mg/dL 98  BUN 6 - 20 mg/dL 12  Creatinine 0.44 - 1.00 mg/dL 0.82  Sodium 135 - 145 mmol/L 139  Potassium 3.5 - 5.1 mmol/L 3.8  Chloride 98 - 111 mmol/L 105  CO2 22 - 32 mmol/L 24  Calcium 8.9 - 10.3 mg/dL 9.5  Total Protein 6.5 - 8.1 g/dL 7.7  Total Bilirubin 0.3 - 1.2 mg/dL 0.6  Alkaline Phos 38 - 126 U/L 78  AST 15 - 41 U/L 16  ALT 0 - 44 U/L 11   CBC Latest Ref Rng & Units 12/19/2020  WBC 4.0 - 10.5 K/uL 8.2  Hemoglobin 12.0 - 15.0 g/dL 12.9  Hematocrit 36.0 - 46.0 % 40.1  Platelets 150 - 400 K/uL 305     Assessment and plan- Patient is a 50 y.o. female with right breast DCIS ER positive s/p surgery and adjuvant radiation treatment here to discuss hormone management  Patient has had her Mirena taken out about 2 weeks ago.  She has not had any menstrual cycles after Mirena was inserted or taken out.  We will recheck herHormone levels which showed estradiol levels of 10.5 which are more in the menopausal range.  However her [place] was 36.  Typically in the consistent menopausal range FSH levels should be closer to 75 -100.  I would therefore favor proceeding with tamoxifen at this time over AI.  We could certainly check her hormone levels 6 to 8 months down the line and if the [facility] levels are higher and  estradiol levels continue to be low certainly a transition to AI could be made at that time.  In ER positive DCIS both tamoxifen and AI's have comparable overall survival.  AI's have been found to be better than tamoxifen in improving disease-free survival in women less than 42 years of age.   Discussed risks and benefits of tamoxifen including all but not limited to fatigue, hot flashes, risk of DVTs cataracts and small risk of uterine cancer.  Discussed the need to get yearly eye exams as well as GYN exam on tamoxifen.  Written information about tamoxifen has been given to patient.  Patient understands and agrees to proceed as planned.  E prescription has been sent to pharmacy and I will see her back in 3 months with labs   Visit Diagnosis 1. Ductal carcinoma in situ (DCIS) of right breast      Dr. Randa Evens, MD, MPH [facility] [phone] 01/10/2021 11:30 AM

## 2021-01-11 ENCOUNTER — Ambulatory Visit: Payer: 59

## 2021-01-14 ENCOUNTER — Ambulatory Visit: Payer: 59

## 2021-01-15 ENCOUNTER — Ambulatory Visit: Payer: 59

## 2021-01-16 ENCOUNTER — Ambulatory Visit: Payer: 59

## 2021-01-17 ENCOUNTER — Ambulatory Visit: Payer: 59

## 2021-01-18 ENCOUNTER — Ambulatory Visit: Payer: 59

## 2021-01-21 ENCOUNTER — Ambulatory Visit: Payer: 59

## 2021-01-22 ENCOUNTER — Ambulatory Visit: Payer: 59

## 2021-01-22 ENCOUNTER — Other Ambulatory Visit: Payer: Self-pay | Admitting: Family

## 2021-01-22 DIAGNOSIS — R030 Elevated blood-pressure reading, without diagnosis of hypertension: Secondary | ICD-10-CM

## 2021-01-22 DIAGNOSIS — Z0000 Encounter for general adult medical examination without abnormal findings: Secondary | ICD-10-CM

## 2021-01-23 ENCOUNTER — Ambulatory Visit: Payer: 59

## 2021-01-24 ENCOUNTER — Ambulatory Visit: Payer: 59

## 2021-01-25 ENCOUNTER — Ambulatory Visit: Payer: 59

## 2021-01-28 ENCOUNTER — Ambulatory Visit: Payer: 59

## 2021-01-31 ENCOUNTER — Encounter: Payer: Self-pay | Admitting: Radiation Oncology

## 2021-01-31 ENCOUNTER — Ambulatory Visit
Admission: RE | Admit: 2021-01-31 | Discharge: 2021-01-31 | Disposition: A | Discharge status: Home / Self Care | Payer: 59 | Source: Ambulatory Visit | Attending: Radiation Oncology | Admitting: Radiation Oncology

## 2021-01-31 ENCOUNTER — Other Ambulatory Visit: Payer: Self-pay | Admitting: Oncology

## 2021-01-31 ENCOUNTER — Other Ambulatory Visit: Payer: Self-pay

## 2021-01-31 VITALS — BP 138/92 | HR 85 | Temp 97.2°F | Wt 290.0 lb

## 2021-01-31 DIAGNOSIS — Z170 Estrogen receptor positive status [ER+]: Secondary | ICD-10-CM | POA: Insufficient documentation

## 2021-01-31 DIAGNOSIS — Z923 Personal history of irradiation: Secondary | ICD-10-CM | POA: Insufficient documentation

## 2021-01-31 DIAGNOSIS — D0511 Intraductal carcinoma in situ of right breast: Secondary | ICD-10-CM | POA: Insufficient documentation

## 2021-01-31 DIAGNOSIS — Z79810 Long term (current) use of selective estrogen receptor modulators (SERMs): Secondary | ICD-10-CM | POA: Diagnosis not present

## 2021-01-31 NOTE — Progress Notes (Signed)
Radiation Oncology Follow up Note  Name: Lorraine Holmes   Date:   01/31/2021 MRN:  [id number] DOB: 1970-11-20    This 50 y.o. 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  Seen today in routine follow-up she is doing well.  She specifically denies breast tenderness cough or bone pain.  All skin reactions have subsided.  She has been started on.  Tamoxifen tolerant well without side effect.  COMPLICATIONS OF TREATMENT: none  FOLLOW UP COMPLIANCE: keeps appointments   PHYSICAL EXAM:  BP (!) 138/92   Pulse 85   Temp (!) 97.2 F (36.2 C) (Tympanic)   Wt 290 lb (131.5 kg)   BMI 48.26 kg/m  Lungs are clear to A&P cardiac examination essentially unremarkable with regular rate and rhythm. No dominant mass or nodularity is noted in either breast in 2 positions examined. Incision is well-healed. No axillary or supraclavicular adenopathy is appreciated. Cosmetic result is excellent.  Well-developed well-nourished patient in NAD. HEENT reveals PERLA, EOMI, discs not visualized.  Oral cavity is clear. No oral mucosal lesions are identified. Neck is clear without evidence of cervical or supraclavicular adenopathy. Lungs are clear to A&P. Cardiac examination is essentially unremarkable with regular rate and rhythm without murmur rub or thrill. Abdomen is benign with no organomegaly or masses noted. Motor sensory and DTR levels are equal and symmetric in the upper and lower extremities. Cranial nerves II through XII are grossly intact. Proprioception is intact. No peripheral adenopathy or edema is identified. No motor or sensory levels are noted. Crude visual fields are within normal range.  RADIOLOGY  RESULTS: No current films for review  PLAN: Present time patient is doing well 1 month out from whole breast radiation.  Excellent cosmetic result.  And pleased with her overall progress.  She continues on tamoxifen.  I have asked to see her back in 4 to 5 months for follow-up.  Patient knows to call with any concerns.  I would like to take this opportunity to thank you for allowing me to participate in the care of your patient.Noreene Filbert, MD

## 2021-02-27 ENCOUNTER — Ambulatory Visit: Payer: 59 | Admitting: Oncology

## 2021-02-28 ENCOUNTER — Other Ambulatory Visit: Payer: Self-pay | Admitting: Oncology

## 2021-03-13 ENCOUNTER — Other Ambulatory Visit: Payer: Self-pay

## 2021-03-13 DIAGNOSIS — D0511 Intraductal carcinoma in situ of right breast: Secondary | ICD-10-CM

## 2021-03-27 ENCOUNTER — Encounter: Payer: Self-pay | Admitting: Family

## 2021-03-29 ENCOUNTER — Other Ambulatory Visit: Payer: Self-pay | Admitting: Oncology

## 2021-04-16 ENCOUNTER — Ambulatory Visit
Admission: RE | Admit: 2021-04-16 | Discharge: 2021-04-16 | Disposition: A | Discharge status: Home / Self Care | Payer: 59 | Source: Ambulatory Visit | Attending: Surgery | Admitting: Surgery

## 2021-04-16 ENCOUNTER — Other Ambulatory Visit: Payer: Self-pay

## 2021-04-16 DIAGNOSIS — D0511 Intraductal carcinoma in situ of right breast: Secondary | ICD-10-CM | POA: Insufficient documentation

## 2021-04-17 ENCOUNTER — Ambulatory Visit: Payer: 59 | Admitting: Oncology

## 2021-04-17 ENCOUNTER — Other Ambulatory Visit: Payer: 59

## 2021-04-23 ENCOUNTER — Other Ambulatory Visit: Payer: Self-pay

## 2021-04-23 ENCOUNTER — Encounter: Payer: Self-pay | Admitting: Oncology

## 2021-04-23 ENCOUNTER — Inpatient Hospital Stay: Payer: 59 | Admitting: Oncology

## 2021-04-23 ENCOUNTER — Inpatient Hospital Stay: Payer: 59 | Attending: Oncology

## 2021-04-23 VITALS — BP 124/71 | HR 77 | Temp 96.7°F | Resp 20 | Wt 296.6 lb

## 2021-04-23 DIAGNOSIS — Z5181 Encounter for therapeutic drug level monitoring: Secondary | ICD-10-CM | POA: Diagnosis not present

## 2021-04-23 DIAGNOSIS — D0511 Intraductal carcinoma in situ of right breast: Secondary | ICD-10-CM | POA: Diagnosis present

## 2021-04-23 DIAGNOSIS — Z79810 Long term (current) use of selective estrogen receptor modulators (SERMs): Secondary | ICD-10-CM | POA: Diagnosis not present

## 2021-04-23 LAB — COMPREHENSIVE METABOLIC PANEL
ALT: 12 U/L (ref 0–44)
AST: 18 U/L (ref 15–41)
Albumin: 3.9 g/dL (ref 3.5–5.0)
Alkaline Phosphatase: 64 U/L (ref 38–126)
Anion gap: 7 (ref 5–15)
BUN: 12 mg/dL (ref 6–20)
CO2: 27 mmol/L (ref 22–32)
Calcium: 9.2 mg/dL (ref 8.9–10.3)
Chloride: 105 mmol/L (ref 98–111)
Creatinine, Ser: 0.72 mg/dL (ref 0.44–1.00)
GFR, Estimated: >60 mL/min (ref >60)
Glucose, Bld: 88 mg/dL (ref 70–99)
Potassium: 3.7 mmol/L (ref 3.5–5.1)
Sodium: 139 mmol/L (ref 135–145)
Total Bilirubin: 0.3 mg/dL (ref 0.3–1.2)
Total Protein: 7.8 g/dL (ref 6.5–8.1)

## 2021-04-23 NOTE — Progress Notes (Signed)
Hematology/Oncology Consult note [facility]  Telephone:([phone] [phone]  Patient Care Team: Burnard Hawthorne, FNP as PCP - General (Family Medicine) Sindy Guadeloupe, MD as Consulting Physician (Oncology)   Name of the patient: Lorraine Holmes  [id number]  11-06-1970   Date of visit: 04/23/21  Diagnosis- right breast DCIS ER positive  Chief complaint/ Reason for visit-routine follow-up of right breast DCIS on tamoxifen  Heme/Onc history: Patient is a 50 year old female who was found to have calcifications in the right breast on a screening mammogram that was followed by diagnostic mammogram and ultrasound.  Showed 0.8 cm of indeterminate calcification which was biopsied and consistent with high-grade DCIS comedo type.  She underwent Lumpectomy which showed high-grade DCIS which was negative for any invasive carcinoma.  Margins negative with 2 mm ER positive pTis.   Patient has met with radiation oncology and will be going for adjuvant radiation treatment as well.  She has a Mirena in place for the last 7 years and has not had any menstrual cycles on Mirena.  Mirena taken out in June 2022    Interval history-tolerating tamoxifen well without any significant side effects.  Reports no breast concerns at this time.  No menstrual cycle since Mirena was taken out in June 2022.  ECOG PS- 0 Pain scale- 0   Review of systems- Review of Systems  Constitutional:  Negative for chills, fever, malaise/fatigue and weight loss.  HENT:  Negative for congestion, ear discharge and nosebleeds.   Eyes:  Negative for blurred vision.  Respiratory:  Negative for cough, hemoptysis, sputum production, shortness of breath and wheezing.   Cardiovascular:  Negative for chest pain, palpitations, orthopnea and claudication.  Gastrointestinal:  Negative for abdominal pain, blood in stool, constipation, diarrhea, heartburn, melena, nausea and vomiting.  Genitourinary:   Negative for dysuria, flank pain, frequency, hematuria and urgency.  Musculoskeletal:  Negative for back pain, joint pain and myalgias.  Skin:  Negative for rash.  Neurological:  Negative for dizziness, tingling, focal weakness, seizures, weakness and headaches.  Endo/Heme/Allergies:  Does not bruise/bleed easily.  Psychiatric/Behavioral:  Negative for depression and suicidal ideas. The patient does not have insomnia.      Allergies  Allergen Reactions   No [employer] Food Allergies Hives    Many food allergies.  Takes zyrtec for hives everyday     Past Medical History:  Diagnosis Date   Anemia    Arthritis    Bilateral knee   Breast cancer ([place])    Ductal carcinoma in situ (DCIS) of right breast   Breast cancer ([place])    Ductal carcinoma in situ (DCIS) of right breast 10/05/2020   History of blood transfusion    History of chicken pox    Hypertension    Obesity    bmi 44     Past Surgical History:  Procedure Laterality Date   BREAST BIOPSY Right 09/27/2020   stereo bx, ribbon clip, path pending    BREAST LUMPECTOMY Right 10/24/2020   lumpectomy High grade DCIS   BREAST LUMPECTOMY WITH RADIOFREQUENCY TAG IDENTIFICATION Right 10/24/2020   Procedure: BREAST LUMPECTOMY WITH RADIOFREQUENCY TAG IDENTIFICATION;  Surgeon: Ronny Bacon, MD;  Location: [facility] ORS;  Service: General;  Laterality: Right;   COLONOSCOPY WITH PROPOFOL N/A 07/18/2019   Procedure: COLONOSCOPY WITH PROPOFOL;  Surgeon: Lin Landsman, MD;  Location: [facility] ENDOSCOPY;  Service: Gastroenterology;  Laterality: N/A;   FOOT FRACTURE SURGERY Left    2002.  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 She lives at home with her children and her mother. 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       Working [employer] in [place].       Works from home      Social Determinants of Radio broadcast assistant Strain: Not on [employer] Insecurity: Not on file  Transportation Needs: Not on file  Physical Activity: Not on file  Stress: Not on file  Social Connections: Not on file  Intimate Partner Violence: Not on file    Family History  Problem Relation Age of Onset   Hypertension Mother    Diabetes Mother    Hypertension Father    Heart disease Father 24       CAD - died MI   Diabetes Maternal Grandmother    Breast cancer Maternal Aunt        great aunt   Colon cancer Neg Hx    Thyroid cancer Neg Hx      Current Outpatient Medications:    amLODipine (NORVASC) 10 MG tablet, TAKE 1 TABLET BY MOUTH EVERY DAY, Disp: 30 tablet, Rfl: 5   cetirizine (ZYRTEC) 10 MG tablet, Take 10 mg by mouth daily., Disp: , Rfl:    cholecalciferol (VITAMIN D3) 25 MCG (1000 UNIT) tablet, Take 1,000 Units by mouth daily., Disp: , Rfl:    tamoxifen (NOLVADEX) 20 MG tablet, TAKE 1 TABLET BY MOUTH EVERY DAY, Disp: 30 tablet, Rfl: 1   BINAXNOW COVID-19 AG HOME TEST KIT, Use  as Directed on the Package (Patient not taking: Reported on 04/23/2021), Disp: , Rfl:    meloxicam (MOBIC) 7.5 MG tablet, TAKE 1 TABLET BY MOUTH DAILY AS NEEDED FOR PAIN (Patient not taking: No sig reported), Disp: 30 tablet, Rfl: 1  Physical exam:  Vitals:   04/23/21 1032  BP: 124/71  Pulse: 77  Resp: 20  Temp: (!) 96.7 F (35.9 C)  SpO2: 100%  Weight: 296 lb 9.6 oz (134.5 kg)   Physical Exam Cardiovascular:     Rate and Rhythm: Normal rate and regular rhythm.     Heart sounds: Normal heart sounds.  Pulmonary:     Effort: Pulmonary effort is normal.     Breath sounds: Normal breath sounds.  Abdominal:  General: Bowel sounds are normal.     Palpations: Abdomen is soft.  Skin:    General: Skin is warm and dry.  Neurological:     Mental Status: She is alert and oriented to person, place, and time.   Breast exam was performed in seated and lying down position. Patient is status post right lumpectomy with a well-healed surgical scar. No evidence of any palpable masses. No evidence of axillary adenopathy. No evidence of any palpable masses or lumps in the left breast. No evidence of leftt axillary adenopathy   CMP Latest Ref Rng & Units 04/23/2021  Glucose 70 - 99 mg/dL 88  BUN 6 - 20 mg/dL 12  Creatinine 0.44 - 1.00 mg/dL 0.72  Sodium 135 - 145 mmol/L 139  Potassium 3.5 - 5.1 mmol/L 3.7  Chloride 98 - 111 mmol/L 105  CO2 22 - 32 mmol/L 27  Calcium 8.9 - 10.3 mg/dL 9.2  Total Protein 6.5 - 8.1 g/dL 7.8  Total Bilirubin 0.3 - 1.2 mg/dL 0.3  Alkaline Phos 38 - 126 U/L 64  AST 15 - 41 U/L 18  ALT 0 - 44 U/L 12   CBC Latest Ref Rng & Units 12/19/2020  WBC 4.0 - 10.5 K/uL 8.2  Hemoglobin 12.0 - 15.0 g/dL 12.9  Hematocrit 36.0 - 46.0 % 40.1  Platelets 150 - 400 K/uL 305    No images are attached to the encounter.  MM DIAG BREAST TOMO UNI RIGHT  Result Date: 04/16/2021 CLINICAL DATA:  50 year old female with history of right breast cancer in April 2022 status post  lumpectomy and radiation. EXAM: DIGITAL DIAGNOSTIC UNILATERAL RIGHT MAMMOGRAM WITH TOMOSYNTHESIS AND CAD TECHNIQUE: Right digital diagnostic mammography and breast tomosynthesis was performed. The images were evaluated with computer-aided detection. COMPARISON:  Previous exam(s). ACR Breast Density Category c: The breast tissue is heterogeneously dense, which may obscure small masses. FINDINGS: A spot 2D magnification view of the lumpectomy site was performed in addition to standard views. There are expected postsurgical changes. No suspicious mass, distortion, or microcalcifications are identified to suggest presence of malignancy. IMPRESSION: Benign postsurgical changes in the right breast. No mammographic evidence of malignancy. RECOMMENDATION: Diagnostic bilateral mammogram in February 2023. I have discussed the findings and recommendations with the patient. If applicable, a reminder letter will be sent to the patient regarding the next appointment. BI-RADS CATEGORY  2: Benign. Electronically Signed   By: Audie Pinto M.D.   On: 04/16/2021 11:22    Assessment and plan- Patient is a 50 y.o. female with right breast DCIS ER positive s/p surgery and adjuvant radiation treatment currently on tamoxifen here for routine follow-up  Clinically patient is doing well with no concerning signs and symptoms of recurrence based on today's exam.  Her mammogram from August 2022 on the right side did not reveal any evidence of malignancy.  She is tolerating tamoxifen well without any significant side effects.  She will continue that for at least 5 years.  We have checked her hormone levels today and if she is conclusively postmenopausal we will consider switching her to AI.  I would favor keeping her on tamoxifen for a year and making sure that she does not have any menstrual cycles on it and if she has no menstrual cycles after a year we can switch her to AI at that time.  She will be due for bilateral diagnostic  mammogram in February 2023 which I will schedule and I will see her back in 6 months  Visit Diagnosis 1. Ductal carcinoma in situ (DCIS) of right breast   2. Encounter for monitoring tamoxifen therapy      Dr. Randa Evens, MD, MPH [facility] [phone] 04/23/2021 3:58 PM

## 2021-04-24 ENCOUNTER — Other Ambulatory Visit: Payer: Self-pay

## 2021-04-24 ENCOUNTER — Other Ambulatory Visit: Payer: Self-pay | Admitting: Oncology

## 2021-04-24 LAB — ESTRADIOL: Estradiol: 19.5 pg/mL

## 2021-04-24 LAB — FOLLICLE STIMULATING HORMONE: FSH: 19.6 m[IU]/mL

## 2021-04-25 ENCOUNTER — Ambulatory Visit: Payer: 59 | Admitting: Surgery

## 2021-04-25 ENCOUNTER — Other Ambulatory Visit: Payer: Self-pay

## 2021-04-25 ENCOUNTER — Encounter: Payer: Self-pay | Admitting: Surgery

## 2021-04-25 VITALS — BP 141/84 | HR 84 | Temp 98.4°F | Ht 65.0 in | Wt 294.0 lb

## 2021-04-25 DIAGNOSIS — D0511 Intraductal carcinoma in situ of right breast: Secondary | ICD-10-CM | POA: Diagnosis not present

## 2021-04-25 NOTE — Patient Instructions (Addendum)
Follow up here in March after you have your mammogram.  Continue self breast exams. Call office for any new breast issues or concerns.   Breast Self-Awareness Breast self-awareness is knowing how your breasts look and feel. Doing breast self-awareness is important. It allows you to catch a breast problem early while it is still small and can be treated. All women should do breast self-awareness, including women who have had breast implants. Tell your doctor if you notice a change in your breasts. What you need: A mirror. A well-lit room. How to do a breast self-exam A breast self-exam is one way to learn what is normal for your breasts and to check for changes. To do a breast self-exam: Look for changes  Take off all the clothes above your waist. Stand in front of a mirror in a room with good lighting. Put your hands on your hips. Push your hands down. Look at your breasts and nipples in the mirror to see if one breast or nipple looks different from the other. Check to see if: The shape of one breast is different. The size of one breast is different. There are wrinkles, dips, and bumps in one breast and not the other. Look at each breast for changes in the skin, such as: Redness. Scaly areas. Look for changes in your nipples, such as: Liquid around the nipples. Bleeding. Dimpling. Redness. A change in where the nipples are. Feel for changes  Lie on your back on the floor. Feel each breast. To do this, follow these steps: Pick a breast to feel. Put the arm closest to that breast above your head. Use your other arm to feel the nipple area of your breast. Feel the area with the pads of your three middle fingers by making small circles with your fingers. For the first circle, press lightly. For the second circle, press harder. For the third circle, press even harder. Keep making circles with your fingers at the different pressures as you move down your breast. Stop when you feel  your ribs. Move your fingers a little toward the center of your body. Start making circles with your fingers again, this time going up until you reach your collarbone. Keep making up-and-down circles until you reach your armpit. Remember to keep using the three pressures. Feel the other breast in the same way. Sit or stand in the tub or shower. With soapy water on your skin, feel each breast the same way you did in step 2 when you were lying on the floor. Write down what you find Writing down what you find can help you remember what to tell your doctor. Write down: What is normal for each breast. Any changes you find in each breast, including: The kind of changes you find. Whether you have pain. Size and location of any lumps. When you last had your menstrual period. General tips Check your breasts every month. If you are breastfeeding, the best time to check your breasts is after you feed your baby or after you use a breast pump. If you get menstrual periods, the best time to check your breasts is 5-7 days after your menstrual period is over. With time, you will become comfortable with the self-exam, and you will begin to know if there are changes in your breasts. Contact a doctor if you: See a change in the shape or size of your breasts or nipples. See a change in the skin of your breast or nipples, such as red or  scaly skin. Have fluid coming from your nipples that is not normal. Find a lump or thick area that was not there before. Have pain in your breasts. Have any concerns about your breast health. Summary Breast self-awareness includes looking for changes in your breasts, as well as feeling for changes within your breasts. Breast self-awareness should be done in front of a mirror in a well-lit room. You should check your breasts every month. If you get menstrual periods, the best time to check your breasts is 5-7 days after your menstrual period is over. Let your doctor know of  any changes you see in your breasts, including changes in size, changes on the skin, pain or tenderness, or fluid from your nipples that is not normal. This information is not intended to replace advice given to you by your health care provider. Make sure you discuss any questions you have with your health care provider. Document Revised: 02/09/2018 Document Reviewed: 02/09/2018 Elsevier Patient Education  [place].
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
## 2021-05-19 ENCOUNTER — Other Ambulatory Visit: Payer: Self-pay | Admitting: Oncology

## 2021-06-20 ENCOUNTER — Other Ambulatory Visit: Payer: Self-pay | Admitting: Oncology

## 2021-06-28 ENCOUNTER — Encounter: Payer: 59 | Admitting: Family

## 2021-07-05 ENCOUNTER — Ambulatory Visit (INDEPENDENT_AMBULATORY_CARE_PROVIDER_SITE_OTHER): Payer: 59 | Admitting: Family

## 2021-07-05 ENCOUNTER — Encounter: Payer: Self-pay | Admitting: Family

## 2021-07-05 ENCOUNTER — Encounter: Payer: Self-pay | Admitting: Radiation Oncology

## 2021-07-05 ENCOUNTER — Ambulatory Visit
Admission: RE | Admit: 2021-07-05 | Discharge: 2021-07-05 | Disposition: A | Discharge status: Home / Self Care | Payer: 59 | Source: Ambulatory Visit | Attending: Radiation Oncology | Admitting: Radiation Oncology

## 2021-07-05 ENCOUNTER — Other Ambulatory Visit: Payer: Self-pay

## 2021-07-05 VITALS — BP 127/81 | HR 84 | Temp 97.3°F | Wt 283.8 lb

## 2021-07-05 VITALS — BP 124/72 | HR 93 | Temp 99.0°F | Ht 65.0 in | Wt 284.2 lb

## 2021-07-05 DIAGNOSIS — I10 Essential (primary) hypertension: Secondary | ICD-10-CM

## 2021-07-05 DIAGNOSIS — Z170 Estrogen receptor positive status [ER+]: Secondary | ICD-10-CM | POA: Insufficient documentation

## 2021-07-05 DIAGNOSIS — D0511 Intraductal carcinoma in situ of right breast: Secondary | ICD-10-CM | POA: Diagnosis present

## 2021-07-05 DIAGNOSIS — Z23 Encounter for immunization: Secondary | ICD-10-CM | POA: Diagnosis not present

## 2021-07-05 DIAGNOSIS — Z79810 Long term (current) use of selective estrogen receptor modulators (SERMs): Secondary | ICD-10-CM | POA: Insufficient documentation

## 2021-07-05 DIAGNOSIS — Z923 Personal history of irradiation: Secondary | ICD-10-CM | POA: Diagnosis not present

## 2021-07-05 DIAGNOSIS — Z0000 Encounter for general adult medical examination without abnormal findings: Secondary | ICD-10-CM

## 2021-07-05 LAB — LIPID PANEL
Cholesterol: 157 mg/dL (ref 0–200)
HDL: 50.4 mg/dL (ref >39.00)
LDL Cholesterol: 93 mg/dL (ref 0–99)
NonHDL: 106.32
Total CHOL/HDL Ratio: 3
Triglycerides: 68 mg/dL (ref 0.0–149.0)
VLDL: 13.6 mg/dL (ref 0.0–40.0)

## 2021-07-05 LAB — TSH: TSH: 1.19 u[IU]/mL (ref 0.35–5.50)

## 2021-07-05 LAB — COMPREHENSIVE METABOLIC PANEL
ALT: 10 U/L (ref 0–35)
AST: 13 U/L (ref 0–37)
Albumin: 3.9 g/dL (ref 3.5–5.2)
Alkaline Phosphatase: 64 U/L (ref 39–117)
BUN: 8 mg/dL (ref 6–23)
CO2: 28 mEq/L (ref 19–32)
Calcium: 9.5 mg/dL (ref 8.4–10.5)
Chloride: 105 mEq/L (ref 96–112)
Creatinine, Ser: 0.74 mg/dL (ref 0.40–1.20)
GFR: 92.89 mL/min (ref >60.00)
Glucose, Bld: 96 mg/dL (ref 70–99)
Potassium: 4.4 mEq/L (ref 3.5–5.1)
Sodium: 141 mEq/L (ref 135–145)
Total Bilirubin: 0.3 mg/dL (ref 0.2–1.2)
Total Protein: 7 g/dL (ref 6.0–8.3)

## 2021-07-05 LAB — CBC WITH DIFFERENTIAL/PLATELET
Basophils Absolute: 0.1 10*3/uL (ref 0.0–0.1)
Basophils Relative: 0.7 % (ref 0.0–3.0)
Eosinophils Absolute: 0.2 10*3/uL (ref 0.0–0.7)
Eosinophils Relative: 2.8 % (ref 0.0–5.0)
HCT: 40.4 % (ref 36.0–46.0)
Hemoglobin: 12.9 g/dL (ref 12.0–15.0)
Lymphocytes Relative: 16.9 % (ref 12.0–46.0)
Lymphs Abs: 1.3 10*3/uL (ref 0.7–4.0)
MCHC: 31.9 g/dL (ref 30.0–36.0)
MCV: 88.8 fl (ref 78.0–100.0)
Monocytes Absolute: 0.6 10*3/uL (ref 0.1–1.0)
Monocytes Relative: 7.8 % (ref 3.0–12.0)
Neutro Abs: 5.5 10*3/uL (ref 1.4–7.7)
Neutrophils Relative %: 71.8 % (ref 43.0–77.0)
Platelets: 261 10*3/uL (ref 150.0–400.0)
RBC: 4.54 Mil/uL (ref 3.87–5.11)
RDW: 14.3 % (ref 11.5–15.5)
WBC: 7.6 10*3/uL (ref 4.0–10.5)

## 2021-07-05 LAB — HEMOGLOBIN A1C: Hgb A1c MFr Bld: 5.9 % (ref 4.6–6.5)

## 2021-07-05 LAB — VITAMIN D 25 HYDROXY (VIT D DEFICIENCY, FRACTURES): VITD: 36.83 ng/mL (ref 30.00–100.00)

## 2021-07-05 NOTE — Assessment & Plan Note (Signed)
Patient politely declines clinical breast exam in the office today.  Diagnostic mammogram is scheduled.  Advised repeat Pap smear in the absence of transformation zone and HPV performed 2 years ago.  She politely declines Pap smear in office today and she has upcoming appointment in August 2023 with Dr. Marcelline Mates, GYN in which she prefers to have Pap smear done.  Congratulated patient on weight loss and eating healthier.

## 2021-07-05 NOTE — Assessment & Plan Note (Signed)
She continues to follow with Dr. Janese Banks, Dr. Donella Stade for ongoing surveillance.  She will continue tamoxifen as managed by Dr. Janese Banks.  Diagnostic bilateral mammogram is scheduled for February 2023.   will follow

## 2021-07-05 NOTE — Assessment & Plan Note (Signed)
Chronic, stable.  Continue amlodipine 10mg 

## 2021-07-05 NOTE — Patient Instructions (Signed)
Health Maintenance for Postmenopausal Women ?Menopause is a normal process in which your ability to get pregnant comes to an end. This process happens slowly over many months or years, usually between the ages of 48 and 55. Menopause is complete when you have missed your menstrual period for 12 months. ?It is important to talk with your health care provider about some of the most common conditions that affect women after menopause (postmenopausal women). These include heart disease, cancer, and bone loss (osteoporosis). Adopting a healthy lifestyle and getting preventive care can help to promote your health and wellness. The actions you take can also lower your chances of developing some of these common conditions. ?What are the signs and symptoms of menopause? ?During menopause, you may have the following symptoms: ?Hot flashes. These can be moderate or severe. ?Night sweats. ?Decrease in sex drive. ?Mood swings. ?Headaches. ?Tiredness (fatigue). ?Irritability. ?Memory problems. ?Problems falling asleep or staying asleep. ?Talk with your health care provider about treatment options for your symptoms. ?Do I need hormone replacement therapy? ?Hormone replacement therapy is effective in treating symptoms that are caused by menopause, such as hot flashes and night sweats. ?Hormone replacement carries certain risks, especially as you become older. If you are thinking about using estrogen or estrogen with progestin, discuss the benefits and risks with your health care provider. ?How can I reduce my risk for heart disease and stroke? ?The risk of heart disease, heart attack, and stroke increases as you age. One of the causes may be a change in the body's hormones during menopause. This can affect how your body uses dietary fats, triglycerides, and cholesterol. Heart attack and stroke are medical emergencies. There are many things that you can do to help prevent heart disease and stroke. ?Watch your blood pressure ?High  blood pressure causes heart disease and increases the risk of stroke. This is more likely to develop in people who have high blood pressure readings or are overweight. ?Have your blood pressure checked: ?Every 3-5 years if you are 18-39 years of age. ?Every year if you are 40 years old or older. ?Eat a healthy diet ? ?Eat a diet that includes plenty of vegetables, fruits, low-fat dairy products, and lean protein. ?Do not eat a lot of foods that are high in solid fats, added sugars, or sodium. ?Get regular exercise ?Get regular exercise. This is one of the most important things you can do for your health. Most adults should: ?Try to exercise for at least 150 minutes each week. The exercise should increase your heart rate and make you sweat (moderate-intensity exercise). ?Try to do strengthening exercises at least twice each week. Do these in addition to the moderate-intensity exercise. ?Spend less time sitting. Even light physical activity can be beneficial. ?Other tips ?Work with your health care provider to achieve or maintain a healthy weight. ?Do not use any products that contain nicotine or tobacco. These products include cigarettes, chewing tobacco, and vaping devices, such as e-cigarettes. If you need help quitting, ask your health care provider. ?Know your numbers. Ask your health care provider to check your cholesterol and your blood sugar (glucose). Continue to have your blood tested as directed by your health care provider. ?Do I need screening for cancer? ?Depending on your health history and family history, you may need to have cancer screenings at different stages of your life. This may include screening for: ?Breast cancer. ?Cervical cancer. ?Lung cancer. ?Colorectal cancer. ?What is my risk for osteoporosis? ?After menopause, you may be   at increased risk for osteoporosis. Osteoporosis is a condition in which bone destruction happens more quickly than new bone creation. To help prevent osteoporosis or  the bone fractures that can happen because of osteoporosis, you may take the following actions: ?If you are 19-50 years old, get at least 1,000 mg of calcium and at least 600 international units (IU) of vitamin D per day. ?If you are older than age 50 but younger than age 70, get at least 1,200 mg of calcium and at least 600 international units (IU) of vitamin D per day. ?If you are older than age 70, get at least 1,200 mg of calcium and at least 800 international units (IU) of vitamin D per day. ?Smoking and drinking excessive alcohol increase the risk of osteoporosis. Eat foods that are rich in calcium and vitamin D, and do weight-bearing exercises several times each week as directed by your health care provider. ?How does menopause affect my mental health? ?Depression may occur at any age, but it is more common as you become older. Common symptoms of depression include: ?Feeling depressed. ?Changes in sleep patterns. ?Changes in appetite or eating patterns. ?Feeling an overall lack of motivation or enjoyment of activities that you previously enjoyed. ?Frequent crying spells. ?Talk with your health care provider if you think that you are experiencing any of these symptoms. ?General instructions ?See your health care provider for regular wellness exams and vaccines. This may include: ?Scheduling regular health, dental, and eye exams. ?Getting and maintaining your vaccines. These include: ?Influenza vaccine. Get this vaccine each year before the flu season begins. ?Pneumonia vaccine. ?Shingles vaccine. ?Tetanus, diphtheria, and pertussis (Tdap) booster vaccine. ?Your health care provider may also recommend other immunizations. ?Tell your health care provider if you have ever been abused or do not feel safe at home. ?Summary ?Menopause is a normal process in which your ability to get pregnant comes to an end. ?This condition causes hot flashes, night sweats, decreased interest in sex, mood swings, headaches, or lack  of sleep. ?Treatment for this condition may include hormone replacement therapy. ?Take actions to keep yourself healthy, including exercising regularly, eating a healthy diet, watching your weight, and checking your blood pressure and blood sugar levels. ?Get screened for cancer and depression. Make sure that you are up to date with all your vaccines. ?This information is not intended to replace advice given to you by your health care provider. Make sure you discuss any questions you have with your health care provider. ?Document Revised: 11/12/2020 Document Reviewed: 11/12/2020 ?Elsevier Patient Education ? 2022 [employer] ? ?

## 2021-07-05 NOTE — Progress Notes (Signed)
Subjective:    Patient ID: Lorraine Holmes, female    DOB: [birth year], 50 y.o.   MRN: [id number]  CC: Lorraine Holmes is a 50 y.o. female who presents today for physical exam.    HPI: She feels well today.  No complaints  She continues to follow with Dr. Janese Banks, oncology who prescribes tamoxifen 20mg   She is appointment today with Dr. Donella Stade, rad onc and politely declines getting undressed for clinical breast exam.  H/o right breast cancer.   HTN-she is compliant with Norvasc 10 mg. No cp.    Colorectal Cancer Screening: UTD , Dr Marius Ditch, repeat in 7 years.  Breast Cancer Screening: Diagnostic  mammogram Scheduled 09/03/2021 .  She had diagnostic right mammogram 04/16/21 with Dr Christian Mate.     cervical Cancer Screening: Two years ago with Melody Shambley.  Endocervical transformation component absent.  Negative malignancy. No HPV testing   Lung Cancer Screening: Doesn't have 20 year pack year history and age > 71 years yo 61 years         Tetanus - UTD          Labs: Screening labs today. Exercise: Exercise limited by bilateral chronic knee pain.  She has been focused on drinking more water and eating healthier and lost weight.  Knee pain has improved with weight loss. Alcohol use:  occasional Smoking/tobacco use: Nonsmoker.     HISTORY:  Past Medical History:  Diagnosis Date   Anemia    Arthritis    Bilateral knee   Breast cancer ([place])    Ductal carcinoma in situ (DCIS) of right breast   Breast cancer ([place])    Ductal carcinoma in situ (DCIS) of right breast 10/05/2020   History of blood transfusion    History of chicken pox    Hypertension    Obesity    bmi 44    Past Surgical History:  Procedure Laterality Date   BREAST BIOPSY Right 09/27/2020   stereo bx, ribbon clip, path pending    BREAST LUMPECTOMY Right 10/24/2020   lumpectomy High grade DCIS   BREAST LUMPECTOMY WITH RADIOFREQUENCY TAG IDENTIFICATION Right 10/24/2020   Procedure: BREAST LUMPECTOMY WITH RADIOFREQUENCY  TAG IDENTIFICATION;  Surgeon: Ronny Bacon, MD;  Location: [facility] ORS;  Service: General;  Laterality: Right;   COLONOSCOPY WITH PROPOFOL N/A 07/18/2019   Procedure: COLONOSCOPY WITH PROPOFOL;  Surgeon: Lin Landsman, MD;  Location: [facility];  Service: Gastroenterology;  Laterality: N/A;   FOOT FRACTURE SURGERY Left    2002.  metal in toe   JOINT REPLACEMENT     KNEE ARTHROSCOPY WITH MEDIAL MENISECTOMY Left 11/28/2014   Procedure: KNEE ARTHROSCOPY WITH MEDIAL MENISECTOMY;  Surgeon: Hessie Knows, MD;  Location: [facility] ORS;  Service: Orthopedics;  Laterality: Left;   MEDIAL PARTIAL KNEE REPLACEMENT Right 2013   meniscus tear Right 2012   Family History  Problem Relation Age of Onset   Hypertension Mother    Diabetes Mother    Hypertension Father    Heart disease Father 40       CAD - died MI   Diabetes Maternal Grandmother    Breast cancer Maternal Aunt        great aunt   Colon cancer Neg Hx    Thyroid cancer Neg Hx       ALLERGIES: No healthtouch food allergies  Current Outpatient Medications on File Prior to Visit  Medication Sig Dispense Refill   amLODipine (NORVASC) 10 MG tablet TAKE 1 TABLET BY MOUTH EVERY  DAY 30 tablet 5   cetirizine (ZYRTEC) 10 MG tablet Take 10 mg by mouth daily.     cholecalciferol (VITAMIN D3) 25 MCG (1000 UNIT) tablet Take 1,000 Units by mouth daily.     tamoxifen (NOLVADEX) 20 MG tablet TAKE 1 TABLET BY MOUTH EVERY DAY 30 tablet 1   No current facility-administered medications on file prior to visit.    Social History   Tobacco Use   Smoking status: Never   Smokeless tobacco: Never  Vaping Use   Vaping Use: Never used  Substance Use Topics   Alcohol use: Yes    Alcohol/week: 2.0 standard drinks    Types: 2 Standard drinks or equivalent per week    Comment: occ   Drug use: No    Review of Systems  Constitutional:  Negative for chills, fever and unexpected weight change.  HENT:  Negative for congestion.   Respiratory:   Negative for cough.   Cardiovascular:  Negative for chest pain, palpitations and leg swelling.  Gastrointestinal:  Negative for nausea and vomiting.  Musculoskeletal:  Negative for arthralgias and myalgias.  Skin:  Negative for rash.  Neurological:  Negative for headaches.  Hematological:  Negative for adenopathy.  Psychiatric/Behavioral:  Negative for confusion.      Objective:    BP 124/72 (BP Location: Left Arm, Patient Position: Sitting, Cuff Size: Large)    Pulse 93    Temp 99 F (37.2 C) (Oral)    Ht 5\' 5"  (1.651 m)    Wt 284 lb 3.2 oz (128.9 kg)    SpO2 98%    BMI 47.29 kg/m   BP Readings from Last 3 Encounters:  07/05/21 124/72  04/25/21 (!) 141/84  04/23/21 124/71   Wt Readings from Last 3 Encounters:  07/05/21 284 lb 3.2 oz (128.9 kg)  04/25/21 294 lb (133.4 kg)  04/23/21 296 lb 9.6 oz (134.5 kg)    Physical Exam Vitals reviewed.  Constitutional:      Appearance: She is well-developed.  Eyes:     Conjunctiva/sclera: Conjunctivae normal.  Neck:     Thyroid: No thyroid mass or thyromegaly.  Cardiovascular:     Rate and Rhythm: Normal rate and regular rhythm.     Pulses: Normal pulses.     Heart sounds: Normal heart sounds.  Pulmonary:     Effort: Pulmonary effort is normal.     Breath sounds: Normal breath sounds. No wheezing, rhonchi or rales.  Lymphadenopathy:     Head:     Right side of head: No submental, submandibular, tonsillar, preauricular, posterior auricular or occipital adenopathy.     Left side of head: No submental, submandibular, tonsillar, preauricular, posterior auricular or occipital adenopathy.     Cervical: No cervical adenopathy.  Skin:    General: Skin is warm and dry.  Neurological:     Mental Status: She is alert.  Psychiatric:        Speech: Speech normal.        Behavior: Behavior normal.        Thought Content: Thought content normal.       Assessment & Plan:   Problem List Items Addressed This Visit       Cardiovascular  and Mediastinum   HTN (hypertension)    Chronic, stable.  Continue amlodipine 10 mg        Other   Ductal carcinoma in situ (DCIS) of right breast    She continues to follow with Dr. Janese Banks, Dr. Donella Stade for ongoing surveillance.  She will continue tamoxifen as managed by Dr. Janese Banks.  Diagnostic bilateral mammogram is scheduled for February 2023.   will follow      Routine general medical examination at a health care facility - Primary    Patient politely declines clinical breast exam in the office today.  Diagnostic mammogram is scheduled.  Advised repeat Pap smear in the absence of transformation zone and HPV performed 2 years ago.  She politely declines Pap smear in office today and she has upcoming appointment in August 2023 with Dr. Marcelline Mates, GYN in which she prefers to have Pap smear done.  Congratulated patient on weight loss and eating healthier.      Relevant Orders   VITAMIN D 25 Hydroxy (Vit-D Deficiency, Fractures)   Hemoglobin A1c   TSH   CBC with Differential/Platelet   Comprehensive metabolic panel   Lipid panel     I am having Lorraine Holmes maintain her cetirizine, cholecalciferol, amLODipine, and tamoxifen.   No orders of the defined types were placed in this encounter.   Return precautions given.   Risks, benefits, and alternatives of the medications and treatment plan prescribed today were discussed, and patient expressed understanding.   Education regarding symptom management and diagnosis given to patient on AVS.   Continue to follow with Burnard Hawthorne, FNP for routine health maintenance.   Lorraine Holmes and I agreed with plan.   Mable Paris, FNP

## 2021-07-05 NOTE — Progress Notes (Signed)
Survivorship Care Plan visit completed.  Treatment summary reviewed and given to patient.  ASCO answers booklet reviewed and given to patient.  CARE program and Cancer Transitions discussed with patient along with other resources cancer center offers to patients and caregivers.  Patient verbalized understanding.    

## 2021-07-05 NOTE — Progress Notes (Signed)
Radiation Oncology Follow up Note  Name: Lorraine Holmes   Date:   07/05/2021 MRN:  [id number] DOB: January 30, 1971    This 50 y.o. female presents to the clinic today for 35-month follow-up status post whole breast radiation to her right breast for ductal carcinoma in situ ER positive     .  REFERRING PROVIDER: Burnard Hawthorne, FNP  HPI: Patient is a 50 year old female now seen out 6 months having completed whole breast radiation to her right breast for ER positive ductal carcinoma in situ.  Seen today in routine follow-up she is doing well.  She specifically denies breast tenderness cough or bone pain..  She had mammograms back in October which were BI-RADS 2 benign which I have reviewed.  She is currently on tamoxifen tolerating that well without side effect  COMPLICATIONS OF TREATMENT: none  FOLLOW UP COMPLIANCE: keeps appointments   PHYSICAL EXAM:  BP 127/81    Pulse 84    Temp (!) 97.3 F (36.3 C) (Tympanic)    Wt 283 lb 12.8 oz (128.7 kg)    BMI 47.23 kg/m  Lungs are clear to A&P cardiac examination essentially unremarkable with regular rate and rhythm. No dominant mass or nodularity is noted in either breast in 2 positions examined. Incision is well-healed. No axillary or supraclavicular adenopathy is appreciated. Cosmetic result is excellent.  Well-developed well-nourished patient in NAD. HEENT reveals PERLA, EOMI, discs not visualized.  Oral cavity is clear. No oral mucosal lesions are identified. Neck is clear without evidence of cervical or supraclavicular adenopathy. Lungs are clear to A&P. Cardiac examination is essentially unremarkable with regular rate and rhythm without murmur rub or thrill. Abdomen is benign with no organomegaly or masses noted. Motor sensory and DTR levels are equal and symmetric in the upper and lower extremities. Cranial nerves II through XII are grossly intact. Proprioception is intact. No peripheral adenopathy or edema is identified. No motor or sensory levels  are noted. Crude visual fields are within normal range.  RADIOLOGY RESULTS: Mammograms reviewed compatible with above-stated findings  PLAN: At the present time patient is doing well with no evidence of disease 6 months out from whole breast radiation and pleased with her overall progress.  She continues on tamoxifen without side effect.  I will see her back in 6 months for follow-up.  Patient knows to call with any concerns.  I would like to take this opportunity to thank you for allowing me to participate in the care of your patient.Noreene Filbert, MD

## 2021-07-16 ENCOUNTER — Other Ambulatory Visit: Payer: Self-pay | Admitting: Oncology

## 2021-07-22 DIAGNOSIS — M79671 Pain in right foot: Secondary | ICD-10-CM | POA: Diagnosis not present

## 2021-07-22 DIAGNOSIS — M79672 Pain in left foot: Secondary | ICD-10-CM | POA: Diagnosis not present

## 2021-07-22 DIAGNOSIS — M722 Plantar fascial fibromatosis: Secondary | ICD-10-CM | POA: Diagnosis not present

## 2021-08-12 ENCOUNTER — Other Ambulatory Visit: Payer: Self-pay | Admitting: Family

## 2021-08-12 ENCOUNTER — Other Ambulatory Visit: Payer: Self-pay | Admitting: Oncology

## 2021-08-12 DIAGNOSIS — M79672 Pain in left foot: Secondary | ICD-10-CM | POA: Diagnosis not present

## 2021-08-12 DIAGNOSIS — M722 Plantar fascial fibromatosis: Secondary | ICD-10-CM | POA: Diagnosis not present

## 2021-08-12 DIAGNOSIS — R030 Elevated blood-pressure reading, without diagnosis of hypertension: Secondary | ICD-10-CM

## 2021-08-12 DIAGNOSIS — Z0000 Encounter for general adult medical examination without abnormal findings: Secondary | ICD-10-CM

## 2021-09-03 ENCOUNTER — Other Ambulatory Visit: Payer: Self-pay

## 2021-09-03 ENCOUNTER — Ambulatory Visit
Admission: RE | Admit: 2021-09-03 | Discharge: 2021-09-03 | Disposition: A | Discharge status: Home / Self Care | Payer: BC Managed Care – PPO | Source: Ambulatory Visit | Attending: Oncology | Admitting: Oncology

## 2021-09-03 DIAGNOSIS — D0511 Intraductal carcinoma in situ of right breast: Secondary | ICD-10-CM

## 2021-09-03 DIAGNOSIS — R922 Inconclusive mammogram: Secondary | ICD-10-CM | POA: Diagnosis not present

## 2021-09-03 HISTORY — DX: Personal history of irradiation: Z92.3

## 2021-09-16 ENCOUNTER — Other Ambulatory Visit: Payer: Self-pay | Admitting: Oncology

## 2021-10-03 ENCOUNTER — Encounter: Payer: Self-pay | Admitting: Surgery

## 2021-10-03 ENCOUNTER — Ambulatory Visit: Payer: BC Managed Care – PPO | Admitting: Surgery

## 2021-10-03 VITALS — BP 117/78 | HR 78 | Temp 98.3°F | Ht 65.0 in | Wt 276.0 lb

## 2021-10-03 DIAGNOSIS — D0511 Intraductal carcinoma in situ of right breast: Secondary | ICD-10-CM

## 2021-10-03 NOTE — Patient Instructions (Signed)
The patient has been asked to return to the office in one year with a bilateral diagnostic mammogram.  ? ? ?Please call and ask to speak with a nurse if you develop questions or concerns. ? ? ?Breast Self-Awareness ?Breast self-awareness means being familiar with how your breasts look and feel. It involves checking your breasts regularly and reporting any changes to your health care provider. ?Practicing breast self-awareness is important. Sometimes changes may not be harmful (are benign), but sometimes a change in your breasts can be a sign of a serious medical problem. It is important to learn how to do this procedure correctly so that you can catch problems early, when treatment is more likely to be successful. All women should practice breast self-awareness, including women who have had breast implants. ?What you need: ?A mirror. ?A well-lit room. ?How to do a breast self-exam ?A breast self-exam is one way to learn what is normal for your breasts and whether your breasts are changing. To do a breast self-exam: ?Look for changes ? ?Remove all the clothing above your waist. ?Stand in front of a mirror in a room with good lighting. ?Put your hands on your hips. ?Push your hands firmly downward. ?Compare your breasts in the mirror. Look for differences between them (asymmetry), such as: ?Differences in shape. ?Differences in size. ?Puckers, dips, and bumps in one breast and not the other. ?Look at each breast for changes in the skin, such as: ?Redness. ?Scaly areas. ?Look for changes in your nipples, such as: ?Discharge. ?Bleeding. ?Dimpling. ?Redness. ?A change in position. ?Feel for changes ?Carefully feel your breasts for lumps and changes. It is best to do this while lying on your back on the floor, and again while sitting or standing in the tub or shower with soapy water on your skin. Feel each breast in the following way: ?Place the arm on the side of the breast you are examining above your head. ?Feel your  breast with the other hand. ?Start in the nipple area and make ?-inch (2 cm) overlapping circles to feel your breast. Use the pads of your three middle fingers to do this. Apply light pressure, then medium pressure, then firm pressure. The light pressure will allow you to feel the tissue closest to the skin. The medium pressure will allow you to feel the tissue that is a little deeper. The firm pressure will allow you to feel the tissue close to the ribs. ?Continue the overlapping circles, moving downward over the breast until you feel your ribs below your breast. ?Move one finger-width toward the center of the body. Continue to use the ?-inch (2 cm) overlapping circles to feel your breast as you move slowly up toward your collarbone. ?Continue the up-and-down exam using all three pressures until you reach your armpit. ? ?Write down what you find ?Writing down what you find can help you remember what to discuss with your health care provider. Write down: ?What is normal for each breast. ?Any changes that you find in each breast, including: ?The kind of changes you find. ?Any pain or tenderness. ?Size and location of any lumps. ?Where you are in your menstrual cycle, if you are still menstruating. ?General tips and recommendations ?Examine your breasts every month. ?If you are breastfeeding, the best time to examine your breasts is after a feeding or after using a breast pump. ?If you menstruate, the best time to examine your breasts is 5-7 days after your period. Breasts are generally lumpier during menstrual  periods, and it may be more difficult to notice changes. ?With time and practice, you will become more familiar with the variations in your breasts and more comfortable with the exam. ?Contact a health care provider if you: ?See a change in the shape or size of your breasts or nipples. ?See a change in the skin of your breast or nipples, such as a reddened or scaly area. ?Have unusual discharge from your  nipples. ?Find a lump or thick area that was not there before. ?Have pain in your breasts. ?Have any concerns related to your breast health. ?Summary ?Breast self-awareness includes looking for physical changes in your breasts, as well as feeling for any changes within your breasts. ?Breast self-awareness should be performed in front of a mirror in a well-lit room. ?You should examine your breasts every month. If you menstruate, the best time to examine your breasts is 5-7 days after your menstrual period. ?Let your health care provider know of any changes you notice in your breasts, including changes in size, changes on the skin, pain or tenderness, or unusual fluid from your nipples. ?This information is not intended to replace advice given to you by your health care provider. Make sure you discuss any questions you have with your health care provider. ?Document Revised: 02/09/2018 Document Reviewed: 02/09/2018 ?Elsevier Patient Education ? [employer]. ? ?
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
## 2021-10-10 ENCOUNTER — Other Ambulatory Visit: Payer: Self-pay | Admitting: Oncology

## 2021-10-22 ENCOUNTER — Encounter: Payer: Self-pay | Admitting: Oncology

## 2021-10-22 ENCOUNTER — Inpatient Hospital Stay: Payer: BC Managed Care – PPO | Attending: Oncology | Admitting: Oncology

## 2021-10-22 VITALS — BP 137/81 | HR 77 | Temp 97.2°F | Resp 18 | Wt 276.4 lb

## 2021-10-22 DIAGNOSIS — D0511 Intraductal carcinoma in situ of right breast: Secondary | ICD-10-CM | POA: Insufficient documentation

## 2021-10-22 DIAGNOSIS — Z79899 Other long term (current) drug therapy: Secondary | ICD-10-CM | POA: Diagnosis not present

## 2021-10-22 DIAGNOSIS — Z79810 Long term (current) use of selective estrogen receptor modulators (SERMs): Secondary | ICD-10-CM

## 2021-10-22 DIAGNOSIS — Z5181 Encounter for therapeutic drug level monitoring: Secondary | ICD-10-CM

## 2021-10-22 NOTE — Progress Notes (Signed)
? ? ? ?Hematology/Oncology Consult note ?[place]  ?[phone] [phone] ? ?Patient Care Team: ?Burnard Hawthorne, FNP as PCP - General (Family Medicine) ?Sindy Guadeloupe, MD as Consulting Physician (Oncology) ?Ronny Bacon, MD as Consulting Physician (General Surgery) ?Noreene Filbert, MD as Consulting Physician (Radiation Oncology) ?Rubie Maid, MD as Consulting Physician (Obstetrics and Gynecology)  ? ?Name of the patient: Lorraine Holmes  ?[id number]  ?1970-08-14  ? ?Date of visit: 10/22/21 ? ?Diagnosis- right breast DCIS ER positive ? ?Chief complaint/ Reason for visit-routine follow-up of breast cancer on tamoxifen ? ?Heme/Onc history: Patient is a 51 year old female who was found to have calcifications in the right breast on a screening mammogram that was followed by diagnostic mammogram and ultrasound.  Showed 0.8 cm of indeterminate calcification which was biopsied and consistent with high-grade DCIS comedo type.  She underwent Lumpectomy which showed high-grade DCIS which was negative for any invasive carcinoma.  Margins negative with 2 mm ER positive pTis. ?  ?Patient has met with radiation oncology and will be going for adjuvant radiation treatment as well.  She has a Mirena in place for the last 7 years and has not had any menstrual cycles on Mirena.  Mirena taken out in June 2022 and no menstrual cycle since then ? ?Interval history-patient is tolerating tamoxifen well without any significant side effects.  She has not had any menstrual cycle since June 2022. ? ?ECOG PS- 0 ?Pain scale- 0 ? ? ?Review of systems- Review of Systems  ?Constitutional:  Negative for chills, fever, malaise/fatigue and weight loss.  ?HENT:  Negative for congestion, ear discharge and nosebleeds.   ?Eyes:  Negative for blurred vision.  ?Respiratory:  Negative for cough, hemoptysis, sputum production, shortness of breath and wheezing.   ?Cardiovascular:  Negative for chest pain,  palpitations, orthopnea and claudication.  ?Gastrointestinal:  Negative for abdominal pain, blood in stool, constipation, diarrhea, heartburn, melena, nausea and vomiting.  ?Genitourinary:  Negative for dysuria, flank pain, frequency, hematuria and urgency.  ?Musculoskeletal:  Negative for back pain, joint pain and myalgias.  ?Skin:  Negative for rash.  ?Neurological:  Negative for dizziness, tingling, focal weakness, seizures, weakness and headaches.  ?Endo/Heme/Allergies:  Does not bruise/bleed easily.  ?Psychiatric/Behavioral:  Negative for depression and suicidal ideas. The patient does not have insomnia.    ? ? ? ?Allergies  ?Allergen Reactions  ? No [employer] Food Allergies Hives  ?  Many food allergies.  Takes zyrtec for hives everyday  ? ? ? ?Past Medical History:  ?Diagnosis Date  ? Anemia   ? Arthritis   ? Bilateral knee  ? Breast cancer ([place])   ? Ductal carcinoma in situ (DCIS) of right breast  ? Breast cancer ([place])   ? Ductal carcinoma in situ (DCIS) of right breast 10/05/2020  ? History of blood transfusion   ? History of chicken pox   ? Hypertension   ? Obesity   ? bmi 46  ? Personal history of radiation therapy   ? ? ? ?Past Surgical History:  ?Procedure Laterality Date  ? BREAST BIOPSY Right 09/27/2020  ? stereo bx, ribbon clip, positive  ? BREAST LUMPECTOMY Right 10/24/2020  ? lumpectomy High grade DCIS  ? BREAST LUMPECTOMY WITH RADIOFREQUENCY TAG IDENTIFICATION Right 10/24/2020  ? Procedure: BREAST LUMPECTOMY WITH RADIOFREQUENCY TAG IDENTIFICATION;  Surgeon: Ronny Bacon, MD;  Location: [facility] ORS;  Service: General;  Laterality: Right;  ? COLONOSCOPY WITH PROPOFOL N/A 07/18/2019  ? Procedure: COLONOSCOPY WITH PROPOFOL;  Surgeon:  Lin Landsman, MD;  Location: [facility] ENDOSCOPY;  Service: Gastroenterology;  Laterality: N/A;  ? FOOT FRACTURE SURGERY Left   ? 2002.  metal in toe  ? JOINT REPLACEMENT    ? KNEE ARTHROSCOPY WITH MEDIAL MENISECTOMY Left 11/28/2014  ? Procedure: KNEE ARTHROSCOPY  WITH MEDIAL MENISECTOMY;  Surgeon: Hessie Knows, MD;  Location: [facility] ORS;  Service: Orthopedics;  Laterality: Left;  ? MEDIAL PARTIAL KNEE REPLACEMENT Right 2013  ? meniscus tear Right 2012  ? ? ?Social History  ? ?Socioeconomic History  ? Marital status: Single  ?  Spouse name: Not on file  ? Number of children: 2  ? Years of education: 18  ? Highest education level: Not on file  ?Occupational History  ? Occupation: Optometrist  ?  Employer: [employer]  ?  Comment: [employer]  ?Tobacco Use  ? Smoking status: Never  ?  Passive exposure: Past  ? Smokeless tobacco: Never  ?Vaping Use  ? Vaping Use: Never used  ?Substance and Sexual Activity  ? Alcohol use: Yes  ?  Alcohol/week: 2.0 standard drinks  ?  Types: 2 Standard drinks or equivalent per week  ?  Comment: occ  ? Drug use: No  ? Sexual activity: Yes  ?  Comment: mirena  ?Other Topics Concern  ? Not on file  ?Social History Narrative  ? Lorraine Holmes grew up in [place]. She lives at home with her children and her mother. 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   ?   ? Working [employer] in [place].   ?   ? Works from home  ?   ? ?Social Determinants of Health  ? ?Financial Resource Strain: Not on file  ?Food Insecurity: Not on file  ?Transportation Needs: Not on file  ?Physical Activity: Not on file  ?Stress: Not on file  ?Social Connections: Not on file  ?Intimate Partner Violence: Not on file  ? ? ?Family History  ?Problem Relation Age of Onset  ? Hypertension Mother   ? Diabetes Mother   ? Hypertension Father   ? Heart disease Father 75  ?     CAD - died MI  ? Diabetes Maternal Grandmother   ? Breast cancer Maternal Aunt   ?     great aunt  ? Colon cancer Neg Hx   ? Thyroid cancer Neg Hx   ? ? ? ?Current Outpatient Medications:  ?  amLODipine (NORVASC) 10 MG tablet, TAKE 1 TABLET BY MOUTH EVERY DAY, Disp: 30 tablet, Rfl: 5 ?  cetirizine (ZYRTEC) 10 MG tablet, Take 10 mg by mouth daily.,  Disp: , Rfl:  ?  cholecalciferol (VITAMIN D3) 25 MCG (1000 UNIT) tablet, Take 1,000 Units by mouth daily., Disp: , Rfl:  ?  tamoxifen (NOLVADEX) 20 MG tablet, TAKE 1 TABLET BY MOUTH EVERY DAY, Disp: 30 tablet, Rfl: 1 ?  buPROPion (WELLBUTRIN XL) 150 MG 24 hr tablet, bupropion HCl XL 150 mg 24 hr tablet, extended release  TAKE 1 TABLET BY MOUTH EVERY MORNING FOR 7 DAYS, THEN INCREASE TO 2 TABS DAILY (Patient not taking: Reported on 10/22/2021), Disp: , Rfl:  ?  meloxicam (MOBIC) 15 MG tablet, Take 15 mg by mouth daily. (Patient not taking: Reported on 10/22/2021), Disp: , Rfl:  ? ?Physical exam:  ?Vitals:  ? 10/22/21 1126  ?BP: 137/81  ?Pulse: 77  ?Resp: 18  ?Temp: (!) 97.2 ?F (36.2 ?C)  ?SpO2: 100%  ?Weight: 276 lb  6.4 oz (125.4 kg)  ? ?Physical Exam ?Constitutional:   ?   General: She is not in acute distress. ?Cardiovascular:  ?   Rate and Rhythm: Normal rate and regular rhythm.  ?   Heart sounds: Normal heart sounds.  ?Pulmonary:  ?   Effort: Pulmonary effort is normal.  ?   Breath sounds: Normal breath sounds.  ?Abdominal:  ?   General: Bowel sounds are normal.  ?   Palpations: Abdomen is soft.  ?Skin: ?   General: Skin is warm and dry.  ?Neurological:  ?   Mental Status: She is alert and oriented to person, place, and time.  ? Breast exam was performed in seated and lying down position. ?Patient is status post right lumpectomy with a well-healed surgical scar. No evidence of any palpable masses. No evidence of axillary adenopathy. No evidence of any palpable masses or lumps in the left breast. No evidence of leftt axillary adenopathy ? ? ? ?  Latest Ref Rng & Units 07/05/2021  ?  9:40 AM  ?CMP  ?Glucose 70 - 99 mg/dL 96    ?BUN 6 - 23 mg/dL 8    ?Creatinine 0.40 - 1.20 mg/dL 0.74    ?Sodium 135 - 145 mEq/L 141    ?Potassium 3.5 - 5.1 mEq/L 4.4    ?Chloride 96 - 112 mEq/L 105    ?CO2 19 - 32 mEq/L 28    ?Calcium 8.4 - 10.5 mg/dL 9.5    ?Total Protein 6.0 - 8.3 g/dL 7.0    ?Total Bilirubin 0.2 - 1.2 mg/dL 0.3     ?Alkaline Phos 39 - 117 U/L 64    ?AST 0 - 37 U/L 13    ?ALT 0 - 35 U/L 10    ? ? ?  Latest Ref Rng & Units 07/05/2021  ?  9:40 AM  ?CBC  ?WBC 4.0 - 10.5 K/uL 7.6    ?Hemoglobin 12.0 - 15.0 g/dL 12.9    ?Hematocrit

## 2021-11-02 ENCOUNTER — Other Ambulatory Visit: Payer: Self-pay | Admitting: Oncology

## 2021-12-17 ENCOUNTER — Other Ambulatory Visit: Payer: Self-pay | Admitting: Oncology

## 2021-12-30 DIAGNOSIS — M1712 Unilateral primary osteoarthritis, left knee: Secondary | ICD-10-CM | POA: Diagnosis not present

## 2022-01-02 ENCOUNTER — Ambulatory Visit: Payer: 59 | Admitting: Radiation Oncology

## 2022-01-09 ENCOUNTER — Ambulatory Visit
Admission: RE | Admit: 2022-01-09 | Discharge: 2022-01-09 | Disposition: A | Discharge status: Home / Self Care | Payer: BC Managed Care – PPO | Source: Ambulatory Visit | Attending: Radiation Oncology | Admitting: Radiation Oncology

## 2022-01-09 ENCOUNTER — Encounter: Payer: Self-pay | Admitting: Radiation Oncology

## 2022-01-09 VITALS — BP 113/70 | HR 85 | Temp 97.1°F | Resp 16 | Wt 277.0 lb

## 2022-01-09 DIAGNOSIS — Z170 Estrogen receptor positive status [ER+]: Secondary | ICD-10-CM | POA: Diagnosis not present

## 2022-01-09 DIAGNOSIS — Z923 Personal history of irradiation: Secondary | ICD-10-CM | POA: Diagnosis not present

## 2022-01-09 DIAGNOSIS — Z79810 Long term (current) use of selective estrogen receptor modulators (SERMs): Secondary | ICD-10-CM | POA: Diagnosis not present

## 2022-01-09 DIAGNOSIS — D0511 Intraductal carcinoma in situ of right breast: Secondary | ICD-10-CM | POA: Diagnosis not present

## 2022-01-09 NOTE — Progress Notes (Signed)
Radiation Oncology Follow up Note  Name: Lorraine Holmes   Date:   01/09/2022 MRN:  [id number] DOB: 1971-05-01    This 51 y.o. female presents to the clinic today for 1 year follow-up status post whole breast radiation to her right breast for ER positive ductal carcinoma in situ.  REFERRING PROVIDER: Burnard Hawthorne, FNP  HPI: Patient is a 51 year old female now at 1 year having completed whole breast radiation to her right breast for ER positive ductal carcinoma in situ.  Seen today in routine follow-up she is doing well she specifically denies breast tenderness cough or bone pain.  She had mammograms back in February which I have reviewed were BI-RADS 2 benign.  She is currently on tamoxifen tolerating that well without side effect.  COMPLICATIONS OF TREATMENT: none  FOLLOW UP COMPLIANCE: keeps appointments   PHYSICAL EXAM:  BP 113/70 (BP Location: Left Arm, Patient Position: Sitting, Cuff Size: Large)   Pulse 85   Temp (!) 97.1 F (36.2 C) (Tympanic)   Resp 16   Wt 277 lb (125.6 kg)   BMI 46.10 kg/m  Lungs are clear to A&P cardiac examination essentially unremarkable with regular rate and rhythm. No dominant mass or nodularity is noted in either breast in 2 positions examined. Incision is well-healed. No axillary or supraclavicular adenopathy is appreciated. Cosmetic result is excellent.  Well-developed well-nourished patient in NAD. HEENT reveals PERLA, EOMI, discs not visualized.  Oral cavity is clear. No oral mucosal lesions are identified. Neck is clear without evidence of cervical or supraclavicular adenopathy. Lungs are clear to A&P. Cardiac examination is essentially unremarkable with regular rate and rhythm without murmur rub or thrill. Abdomen is benign with no organomegaly or masses noted. Motor sensory and DTR levels are equal and symmetric in the upper and lower extremities. Cranial nerves II through XII are grossly intact. Proprioception is intact. No peripheral adenopathy or  edema is identified. No motor or sensory levels are noted. Crude visual fields are within normal range.  RADIOLOGY RESULTS: Mammograms reviewed compatible with above-stated findings  PLAN: The present time patient is doing well now at 1 year with no evidence of disease.  I am pleased with her overall progress.  I have asked to see her back in 1 year for follow-up.  She continues on tamoxifen without side effect.  Patient knows to call with any concerns.  I would like to take this opportunity to thank you for allowing me to participate in the care of your patient.Noreene Filbert, MD

## 2022-01-14 ENCOUNTER — Other Ambulatory Visit: Payer: Self-pay | Admitting: Oncology

## 2022-02-18 ENCOUNTER — Other Ambulatory Visit: Payer: Self-pay | Admitting: Oncology

## 2022-02-19 ENCOUNTER — Other Ambulatory Visit: Payer: Self-pay | Admitting: Family

## 2022-02-19 DIAGNOSIS — Z0000 Encounter for general adult medical examination without abnormal findings: Secondary | ICD-10-CM

## 2022-02-19 DIAGNOSIS — R030 Elevated blood-pressure reading, without diagnosis of hypertension: Secondary | ICD-10-CM

## 2022-02-19 NOTE — Patient Instructions (Signed)

## 2022-02-19 NOTE — Progress Notes (Signed)
ANNUAL PREVENTATIVE CARE GYNECOLOGY  ENCOUNTER NOTE  Subjective:       Lorraine Holmes is a 51 y.o. G96P2002 female here for a routine annual gynecologic exam. The patient is occasionally sexually active. The patient is not taking hormone replacement therapy. Patient denies post-menopausal vaginal bleeding. The patient wears seatbelts: yes. The patient participates in regular exercise: yes (modified due to knee problems). Has the patient ever been transfused or tattooed?: yes. The patient reports that there is not domestic violence in her life.  Current complaints: 1.  No new concerns.    Gynecologic History No LMP recorded. Patient is perimenopausal. Contraception: post menopausal status Last Pap: 02/23/2019. Results were: normal Last mammogram: 09/03/2021. Results were: normal Last Colonoscopy: 07/18/2019    Obstetric History OB History  Gravida Para Term Preterm AB Living  '2 2 2     2  '$ SAB IAB Ectopic Multiple Live Births          2    # Outcome Date GA Lbr Len/2nd Weight Sex Delivery Anes PTL Lv  2 Term 2011    F Vag-Spont   LIV  1 Term 1997    M Vag-Spont   LIV    Past Medical History:  Diagnosis Date   Anemia    Arthritis    Bilateral knee   Breast cancer ([place])    Ductal carcinoma in situ (DCIS) of right breast   Breast cancer ([place])    Ductal carcinoma in situ (DCIS) of right breast 10/05/2020   History of blood transfusion    History of chicken pox    Hypertension    Obesity    bmi 108   Personal history of radiation therapy     Family History  Problem Relation Age of Onset   Hypertension Mother    Diabetes Mother    Hypertension Father    Heart disease Father 70       CAD - died MI   Diabetes Maternal Grandmother    Breast cancer Maternal Aunt        great aunt   Colon cancer Neg Hx    Thyroid cancer Neg Hx     Past Surgical History:  Procedure Laterality Date   BREAST BIOPSY Right 09/27/2020   stereo bx, ribbon clip, positive   BREAST LUMPECTOMY  Right 10/24/2020   lumpectomy High grade DCIS   BREAST LUMPECTOMY WITH RADIOFREQUENCY TAG IDENTIFICATION Right 10/24/2020   Procedure: BREAST LUMPECTOMY WITH RADIOFREQUENCY TAG IDENTIFICATION;  Surgeon: Ronny Bacon, MD;  Location: [facility] OR[facility];  Service: General;  Laterality: Right;   COLONOSCOPY WITH PROPOFOL N/A 07/18/2019   Procedure: COLONOSCOPY WITH PROPOFOL;  Surgeon: Lin Landsman, MD;  Location: [facility];  Service: Gastroenterology;  Laterality: N/A;   FOOT FRACTURE SURGERY Left    2002.  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 She lives at home with her children and her mother. 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       Working [employer] in [place].       Works from home      [place] Strain: Not on [employer] Insecurity: Not on file   Transportation Needs: Not on file  Physical Activity: Not on file  Stress: Not on file  Social Connections: Not on file  Intimate Partner Violence: Not on file    Current Outpatient Medications on File Prior to Visit  Medication Sig Dispense Refill   amLODipine (NORVASC) 10 MG tablet TAKE 1 TABLET BY MOUTH EVERY DAY 30 tablet 5   cetirizine (ZYRTEC) 10 MG tablet Take 10 mg by mouth daily.     cholecalciferol (VITAMIN D3) 25 MCG (1000 UNIT) tablet Take 1,000 Units by mouth daily.     meloxicam (MOBIC) 15 MG tablet Take 15 mg by mouth daily.     tamoxifen (NOLVADEX) 20 MG tablet TAKE 1 TABLET BY MOUTH EVERY DAY 30 tablet 1   No current facility-administered medications on file prior to visit.    Allergies  Allergen Reactions   No [employer] Food Allergies Hives    Many food allergies.  Takes zyrtec for hives everyday      Review of Systems ROS Review of Systems - General ROS: negative for - chills, fatigue, fever, hot flashes, night sweats, weight gain or weight loss Psychological ROS: negative for - anxiety, decreased libido, depression, mood swings, physical abuse or sexual abuse Ophthalmic ROS: negative for - blurry vision, eye pain or loss of vision ENT ROS: negative for - headaches, hearing change, visual changes or vocal changes Allergy and Immunology ROS: negative for - hives, itchy/watery eyes or seasonal allergies Hematological and Lymphatic ROS: negative for - bleeding problems, bruising, swollen lymph nodes or weight loss Endocrine ROS: negative for - galactorrhea, hair pattern changes, hot flashes, malaise/lethargy, mood swings, palpitations, polydipsia/polyuria, skin changes, temperature intolerance or unexpected weight changes Breast ROS: negative for - new or changing breast lumps or nipple discharge Respiratory ROS: negative for - cough or shortness of breath Cardiovascular ROS: negative for - chest pain, irregular heartbeat, palpitations or shortness of  breath Gastrointestinal ROS: no abdominal pain, change in bowel habits, or black or bloody stools Genito-Urinary ROS: no dysuria, trouble voiding, or hematuria Musculoskeletal ROS: negative for - joint pain or joint stiffness Neurological ROS: negative for - bowel and bladder control changes Dermatological ROS: negative for rash and skin lesion changes   Objective:   BP (!) 134/93   Pulse 85   Resp 16   Ht '5\' 6"'$  (1.676 m)   Wt 281 lb 1.6 oz (127.5 kg)   BMI 45.37 kg/m Body mass index is 45.37 kg/m.  CONSTITUTIONAL: Well-developed, well-nourished female in no acute distress.  PSYCHIATRIC: Normal mood  and affect. Normal behavior. Normal judgment and thought content. [place]: Alert and oriented to person, place, and time. Normal muscle tone coordination. No cranial nerve deficit noted. HENT:  Normocephalic, atraumatic, External right and left ear normal. Oropharynx is clear and moist EYES: Conjunctivae and EOM are normal. Pupils are equal, round, and reactive to light. No scleral icterus.  NECK: Normal range of motion, supple, no masses.  Normal thyroid.  SKIN: Skin is warm and dry. No rash noted. Not diaphoretic. No erythema. No pallor. CARDIOVASCULAR: Normal heart rate noted, regular rhythm, no murmur. RESPIRATORY: Clear to auscultation bilaterally. Effort and breath sounds normal, no problems with respiration noted. BREASTS: Symmetric in size. No masses, skin changes, nipple drainage, or lymphadenopathy. ABDOMEN: Soft, normal bowel sounds, no distention noted.  No tenderness, rebound or guarding.  BLADDER: Normal PELVIC:  Bladder no bladder distension noted  Urethra: normal appearing urethra with no masses, tenderness or lesions  Vulva: normal appearing vulva with no masses, tenderness or lesions  Vagina: normal appearing vagina with normal color and discharge, no lesions  Cervix: normal appearing cervix without discharge or lesions  Uterus: uterus is normal size, shape,  consistency and nontender  Adnexa: normal adnexa in size, nontender and no masses  RV: External Exam NormaI, No Rectal Masses, and Normal Sphincter tone  MUSCULOSKELETAL: Normal range of motion. No tenderness.  No cyanosis, clubbing, or edema.  2+ distal pulses. LYMPHATIC: No Axillary, Supraclavicular, or Inguinal Adenopathy.   Labs: Lab Results  Component Value Date   WBC 7.6 07/05/2021   HGB 12.9 07/05/2021   HCT 40.4 07/05/2021   MCV 88.8 07/05/2021   PLT 261.0 07/05/2021    Lab Results  Component Value Date   CREATININE 0.74 07/05/2021   BUN 8 07/05/2021   NA 141 07/05/2021   K 4.4 07/05/2021   CL 105 07/05/2021   CO2 28 07/05/2021    Lab Results  Component Value Date   ALT 10 07/05/2021   AST 13 07/05/2021   ALKPHOS 64 07/05/2021   BILITOT 0.3 07/05/2021    Lab Results  Component Value Date   CHOL 157 07/05/2021   HDL 50.40 07/05/2021   LDLCALC 93 07/05/2021   TRIG 68.0 07/05/2021   CHOLHDL 3 07/05/2021    Lab Results  Component Value Date   TSH 1.19 07/05/2021    Lab Results  Component Value Date   HGBA1C 5.9 07/05/2021     Assessment:   1. Encounter for well woman exam with routine gynecological exam   2. Ductal carcinoma in situ (DCIS) of right breast   3. Hypertension, unspecified type   4. Menopause   5. Adult BMI 45.0-49.9 kg/sq m [facility])      Plan:  Pap: Pap Co Test Mammogram:  UTD:  Colon Screening:   UTD Labs:  UTD: Done by PCP Routine preventative health maintenance measures emphasized: Exercise/Diet/Weight control, Tobacco Warnings, Alcohol/Substance use risks, Stress Management, and Safe Sex DCIS in the right breast, currently on tamoxifen therapy.  Managed by oncology. Menopause, notes occasional hot flashes but otherwise manageable. Hypertension managed by PCP. Return to [place], MD [facility]

## 2022-02-21 ENCOUNTER — Ambulatory Visit (INDEPENDENT_AMBULATORY_CARE_PROVIDER_SITE_OTHER): Payer: BC Managed Care – PPO | Admitting: Obstetrics and Gynecology

## 2022-02-21 ENCOUNTER — Other Ambulatory Visit (HOSPITAL_COMMUNITY)
Admission: RE | Admit: 2022-02-21 | Discharge: 2022-02-21 | Disposition: A | Discharge status: Home / Self Care | Payer: BC Managed Care – PPO | Source: Ambulatory Visit | Attending: Obstetrics and Gynecology | Admitting: Obstetrics and Gynecology

## 2022-02-21 ENCOUNTER — Encounter: Payer: Self-pay | Admitting: Obstetrics and Gynecology

## 2022-02-21 VITALS — BP 134/93 | HR 85 | Resp 16 | Ht 66.0 in | Wt 281.1 lb

## 2022-02-21 DIAGNOSIS — Z124 Encounter for screening for malignant neoplasm of cervix: Secondary | ICD-10-CM | POA: Insufficient documentation

## 2022-02-21 DIAGNOSIS — Z01419 Encounter for gynecological examination (general) (routine) without abnormal findings: Secondary | ICD-10-CM | POA: Diagnosis not present

## 2022-02-21 DIAGNOSIS — I10 Essential (primary) hypertension: Secondary | ICD-10-CM | POA: Diagnosis not present

## 2022-02-21 DIAGNOSIS — D0511 Intraductal carcinoma in situ of right breast: Secondary | ICD-10-CM | POA: Diagnosis not present

## 2022-02-21 DIAGNOSIS — Z780 Asymptomatic menopausal state: Secondary | ICD-10-CM | POA: Diagnosis not present

## 2022-02-21 DIAGNOSIS — Z6841 Body Mass Index (BMI) 40.0 and over, adult: Secondary | ICD-10-CM

## 2022-02-21 NOTE — Addendum Note (Signed)
Addended by: Chilton Greathouse on: 02/21/2022 12:05 PM   Modules accepted: Orders

## 2022-02-27 LAB — CYTOLOGY - PAP
Adequacy: ABSENT
Comment: NEGATIVE
Diagnosis: NEGATIVE
High risk HPV: NEGATIVE

## 2022-03-26 IMAGING — MG MM BREAST SURGICAL SPECIMEN
1 series · 1 of 1 positions shown · non-contrast
Comparison: Previous exam(s).

CLINICAL DATA: Surgical excision of DCIS

EXAM:
SPECIMEN RADIOGRAPH OF THE RIGHT BREAST

[R]
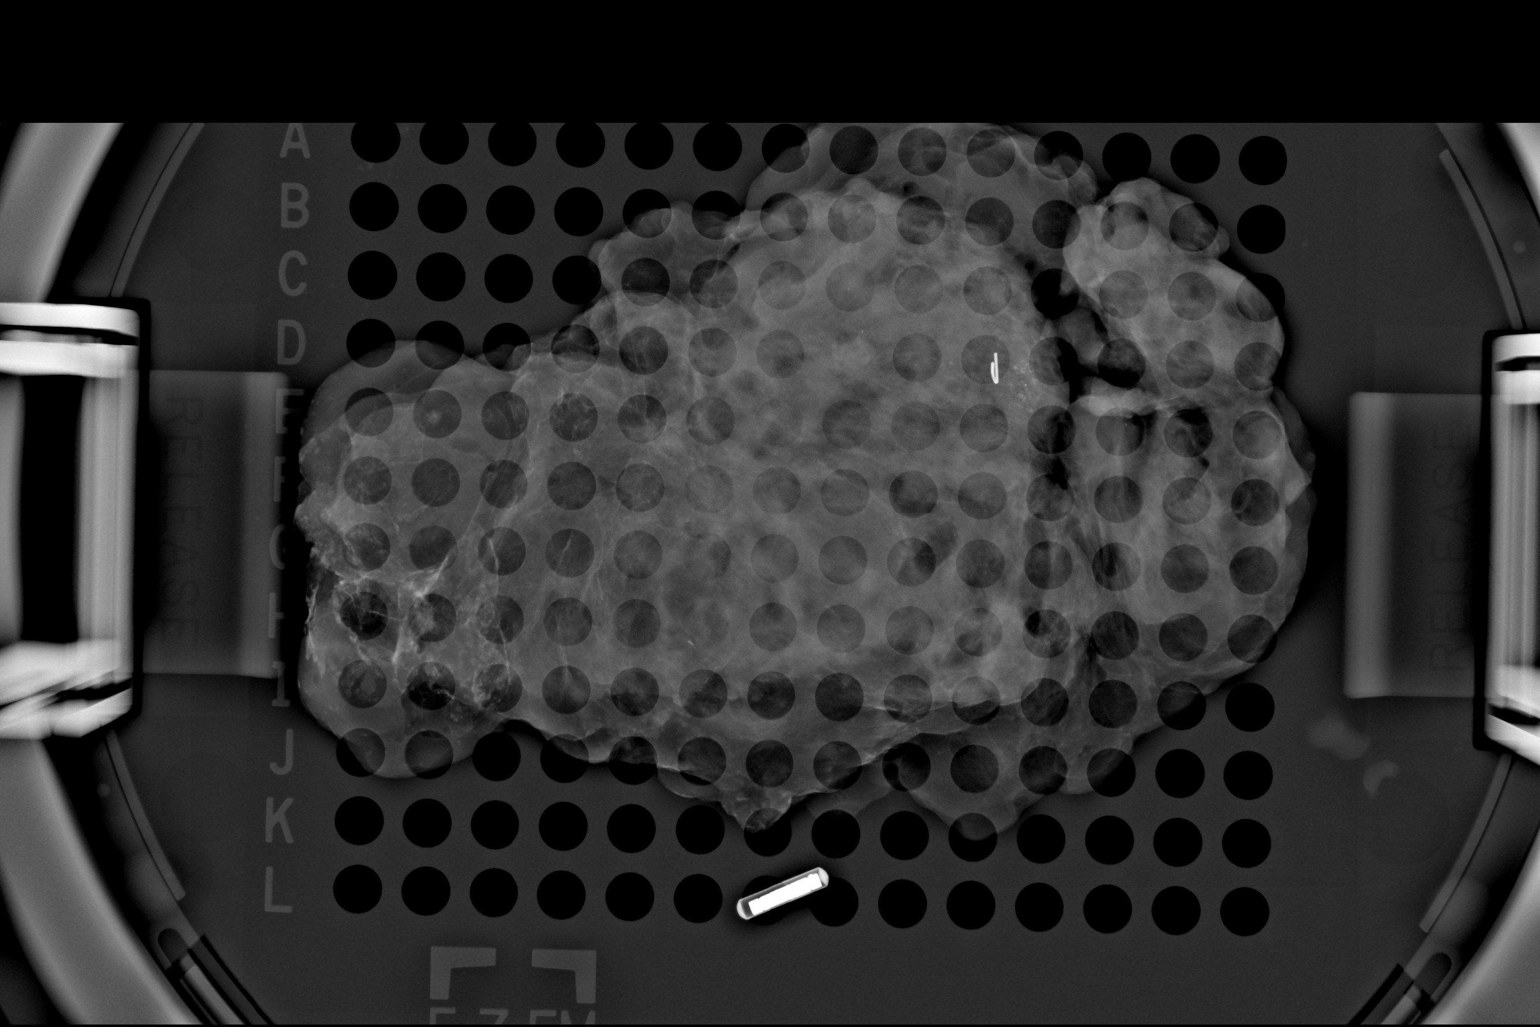

[1 of 1 positions shown; findings below may reference images not displayed]

FINDINGS: Status post excision of the RIGHT breast. The RF tag and RIBBON
biopsy marker clip are present, completely intact, and were marked
for pathology. RIBBON clip is at D10. Findings were discussed with
Dr. Ejona at [DATE]
IMPRESSION: Specimen radiograph of the RIGHT breast.

## 2022-04-23 ENCOUNTER — Inpatient Hospital Stay: Payer: BC Managed Care – PPO | Attending: Oncology | Admitting: Medical Oncology

## 2022-04-23 ENCOUNTER — Inpatient Hospital Stay: Payer: BC Managed Care – PPO

## 2022-04-23 ENCOUNTER — Other Ambulatory Visit: Payer: Self-pay

## 2022-04-23 VITALS — BP 136/58 | HR 80 | Temp 96.6°F | Resp 20 | Ht 66.0 in | Wt 280.0 lb

## 2022-04-23 DIAGNOSIS — I10 Essential (primary) hypertension: Secondary | ICD-10-CM | POA: Diagnosis not present

## 2022-04-23 DIAGNOSIS — D0511 Intraductal carcinoma in situ of right breast: Secondary | ICD-10-CM | POA: Insufficient documentation

## 2022-04-23 DIAGNOSIS — Z5181 Encounter for therapeutic drug level monitoring: Secondary | ICD-10-CM | POA: Diagnosis not present

## 2022-04-23 DIAGNOSIS — Z79899 Other long term (current) drug therapy: Secondary | ICD-10-CM | POA: Diagnosis not present

## 2022-04-23 DIAGNOSIS — Z170 Estrogen receptor positive status [ER+]: Secondary | ICD-10-CM | POA: Insufficient documentation

## 2022-04-23 DIAGNOSIS — Z79810 Long term (current) use of selective estrogen receptor modulators (SERMs): Secondary | ICD-10-CM | POA: Diagnosis not present

## 2022-04-23 DIAGNOSIS — Z923 Personal history of irradiation: Secondary | ICD-10-CM | POA: Diagnosis not present

## 2022-04-23 MED ORDER — TAMOXIFEN CITRATE 20 MG PO TABS: 20.0000 mg | ORAL_TABLET | Freq: Every day | ORAL | 0 refills | Status: DC

## 2022-04-23 NOTE — Progress Notes (Signed)
Hematology/Oncology Consult note [facility]  [phone] [phone]  Patient Care Team: Burnard Hawthorne, FNP as PCP - General (Family Medicine) Sindy Guadeloupe, MD as Consulting Physician (Oncology) Ronny Bacon, MD as Consulting Physician (General Surgery) Noreene Filbert, MD as Consulting Physician (Radiation Oncology) Rubie Maid, MD as Consulting Physician (Obstetrics and Gynecology)   Name of the patient: Lorraine Holmes  [id number]  15-Oct-1970   Date of visit: 04/24/22  Diagnosis- right breast DCIS ER positive  Chief complaint/ Reason for visit-routine follow-up of breast cancer on tamoxifen  Heme/Onc history: Patient is a 51 year old female who was found to have calcifications in the right breast on a screening mammogram that was followed by diagnostic mammogram and ultrasound.  Showed 0.8 cm of indeterminate calcification which was biopsied and consistent with high-grade DCIS comedo type.  She underwent Lumpectomy which showed high-grade DCIS which was negative for any invasive carcinoma.  Margins negative with 2 mm ER positive pTis.   Patient has met with radiation oncology and will be going for adjuvant radiation treatment as well.  She has a Mirena in place for the last 7 years and has not had any menstrual cycles on Mirena.  Mirena taken out in June 2022 and no menstrual cycle since then  Interval history- She reports that she had her Mirena removed months ago. Has not had a menstrual cycle since June 2022. Currently on Tamoxifen and tolerating it well. No bothersome symptoms. She reports no breast lumps/bumps/discharge/skin texture changes or concerns. Last mammogram was on 09/03/2021 Bi-Rads category 2.   Wt Readings from Last 3 Encounters:  04/23/22 280 lb (127 kg)  02/21/22 281 lb 1.6 oz (127.5 kg)  01/09/22 277 lb (125.6 kg)     ECOG PS- 0 Pain scale- 0   Review of systems- Review of Systems  Constitutional:   Negative for chills, fever, malaise/fatigue and weight loss.  HENT:  Negative for congestion, ear discharge and nosebleeds.   Eyes:  Negative for blurred vision.  Respiratory:  Negative for cough, hemoptysis, sputum production, shortness of breath and wheezing.   Cardiovascular:  Negative for chest pain, palpitations, orthopnea and claudication.  Gastrointestinal:  Negative for abdominal pain, blood in stool, constipation, diarrhea, heartburn, melena, nausea and vomiting.  Genitourinary:  Negative for dysuria, flank pain, frequency, hematuria and urgency.  Musculoskeletal:  Negative for back pain, joint pain and myalgias.  Skin:  Negative for rash.  Neurological:  Negative for dizziness, tingling, focal weakness, seizures, weakness and headaches.  Endo/Heme/Allergies:  Does not bruise/bleed easily.  Psychiatric/Behavioral:  Negative for depression and suicidal ideas. The patient does not have insomnia.        Allergies  Allergen Reactions   No [employer] Food Allergies Hives    Many food allergies.  Takes zyrtec for hives everyday     Past Medical History:  Diagnosis Date   Anemia    Arthritis    Bilateral knee   Breast cancer ([place])    Ductal carcinoma in situ (DCIS) of right breast   Breast cancer ([place])    Ductal carcinoma in situ (DCIS) of right breast 10/05/2020   History of blood transfusion    History of chicken pox    Hypertension    Obesity    bmi 44   Personal history of radiation therapy      Past Surgical History:  Procedure Laterality Date   BREAST BIOPSY Right 09/27/2020   stereo bx, ribbon clip, positive   BREAST  LUMPECTOMY Right 10/24/2020   lumpectomy High grade DCIS   BREAST LUMPECTOMY WITH RADIOFREQUENCY TAG IDENTIFICATION Right 10/24/2020   Procedure: BREAST LUMPECTOMY WITH RADIOFREQUENCY TAG IDENTIFICATION;  Surgeon: Ronny Bacon, MD;  Location: [facility] ORS;  Service: General;  Laterality: Right;   COLONOSCOPY WITH PROPOFOL N/A 07/18/2019    Procedure: COLONOSCOPY WITH PROPOFOL;  Surgeon: Lin Landsman, MD;  Location: [facility] ENDOSCOPY;  Service: Gastroenterology;  Laterality: N/A;   FOOT FRACTURE SURGERY Left    2002.  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 She lives at home with her children and her mother. 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       Working [employer] in [place].       Works from home      Social Determinants of Radio broadcast assistant Strain: Not on [employer] Insecurity: Not on file  Transportation Needs: Not on file  Physical Activity: Not on file  Stress: Not on file  Social Connections: Not on file  Intimate Partner Violence: Not on file    Family History  Problem Relation Age of Onset   Hypertension Mother    Diabetes Mother    Hypertension Father    Heart disease Father 36       CAD - died MI   Diabetes  Maternal Grandmother    Breast cancer Maternal Aunt        great aunt   Colon cancer Neg Hx    Thyroid cancer Neg Hx      Current Outpatient Medications:    amLODipine (NORVASC) 10 MG tablet, TAKE 1 TABLET BY MOUTH EVERY DAY, Disp: 30 tablet, Rfl: 5   cetirizine (ZYRTEC) 10 MG tablet, Take 10 mg by mouth daily., Disp: , Rfl:    cholecalciferol (VITAMIN D3) 25 MCG (1000 UNIT) tablet, Take 1,000 Units by mouth daily., Disp: , Rfl:    tamoxifen (NOLVADEX) 20 MG tablet, Take 1 tablet (20 mg total) by mouth daily for 7 days., Disp: 7 tablet, Rfl: 0  Physical exam:  Vitals:   04/23/22 1148  BP: (!) 136/58  Pulse: 80  Resp: 20  Temp: (!) 96.6 F (35.9 C)  TempSrc: Tympanic  Weight: 280 lb (127 kg)  Height:  '5\' 6"'  (1.676 m)   Physical Exam Constitutional:      General: She is not in acute distress. Cardiovascular:     Rate and Rhythm: Normal rate and regular rhythm.     Heart sounds: Normal heart sounds.  Pulmonary:     Effort: Pulmonary effort is normal.     Breath sounds: Normal breath sounds.  Abdominal:     General: Bowel sounds are normal.     Palpations: Abdomen is soft.  Skin:    General: Skin is warm and dry.  Neurological:     Mental Status: She is alert and oriented to person, place, and time.    Breast exam was performed in seated and lying down position. Patient is status post right lumpectomy with a well-healed surgical scar. No evidence of any palpable masses. No evidence of axillary adenopathy. No evidence of any palpable masses or lumps in the left breast. No evidence of leftt axillary adenopathy      Latest Ref Rng & Units 07/05/2021    9:40 AM  CMP  Glucose 70 - 99 mg/dL 96   BUN 6 - 23 mg/dL 8   Creatinine 0.40 - 1.20 mg/dL 0.74   Sodium 135 - 145 mEq/L 141   Potassium 3.5 - 5.1 mEq/L 4.4   Chloride 96 - 112 mEq/L 105   CO2 19 - 32 mEq/L 28   Calcium 8.4 - 10.5 mg/dL 9.5   Total Protein 6.0 - 8.3 g/dL 7.0   Total Bilirubin 0.2 - 1.2 mg/dL 0.3    Alkaline Phos 39 - 117 U/L 64   AST 0 - 37 U/L 13   ALT 0 - 35 U/L 10       Latest Ref Rng & Units 07/05/2021    9:40 AM  CBC  WBC 4.0 - 10.5 K/uL 7.6   Hemoglobin 12.0 - 15.0 g/dL 12.9   Hematocrit 36.0 - 46.0 % 40.4   Platelets 150.0 - 400.0 K/uL 261.0     Assessment and plan- Patient is a 51 y.o. female with history of right breast DCIS on tamoxifen here for routine follow-up  Hormone panels pending. Likely switching her to an AI such as Aromasin. As she is out of her tamoxifen I will send in a bridge rx for her while we await her testing. If switching to the Aromasin she will need a 8 week follow up in office for tolerance check of the medication as well as a baseline bone density scan. She will continue her calcium and vitamin D at this time. She will work on weight bearing exercise as tolerated. RTC 6 months Dr. Janese Banks.    Visit Diagnosis 1. Ductal carcinoma in situ (DCIS) of right breast   2. Encounter for monitoring tamoxifen therapy      Minna Antis  [facility] 04/24/2022 9:06 AM

## 2022-04-24 LAB — FSH/LH
FSH: 12.9 m[IU]/mL
LH: 9.4 m[IU]/mL

## 2022-04-24 LAB — ESTRADIOL: Estradiol: 16.7 pg/mL

## 2022-04-29 ENCOUNTER — Other Ambulatory Visit: Payer: Self-pay | Admitting: Medical Oncology

## 2022-04-29 MED ORDER — TAMOXIFEN CITRATE 20 MG PO TABS: 20.0000 mg | ORAL_TABLET | Freq: Every day | ORAL | 2 refills | Status: DC

## 2022-07-08 ENCOUNTER — Other Ambulatory Visit: Payer: Self-pay | Admitting: Family

## 2022-07-08 ENCOUNTER — Ambulatory Visit (INDEPENDENT_AMBULATORY_CARE_PROVIDER_SITE_OTHER): Payer: BC Managed Care – PPO | Admitting: Family

## 2022-07-08 ENCOUNTER — Encounter: Payer: Self-pay | Admitting: Family

## 2022-07-08 VITALS — BP 128/78 | HR 85 | Temp 98.1°F | Ht 66.0 in | Wt 279.6 lb

## 2022-07-08 DIAGNOSIS — E669 Obesity, unspecified: Secondary | ICD-10-CM | POA: Diagnosis not present

## 2022-07-08 DIAGNOSIS — Z8639 Personal history of other endocrine, nutritional and metabolic disease: Secondary | ICD-10-CM

## 2022-07-08 DIAGNOSIS — Z23 Encounter for immunization: Secondary | ICD-10-CM

## 2022-07-08 DIAGNOSIS — I10 Essential (primary) hypertension: Secondary | ICD-10-CM

## 2022-07-08 DIAGNOSIS — Z0000 Encounter for general adult medical examination without abnormal findings: Secondary | ICD-10-CM

## 2022-07-08 DIAGNOSIS — R030 Elevated blood-pressure reading, without diagnosis of hypertension: Secondary | ICD-10-CM

## 2022-07-08 LAB — LIPID PANEL
Cholesterol: 164 mg/dL (ref 0–200)
HDL: 59.4 mg/dL (ref >39.00)
LDL Cholesterol: 93 mg/dL (ref 0–99)
NonHDL: 104.16
Total CHOL/HDL Ratio: 3
Triglycerides: 55 mg/dL (ref 0.0–149.0)
VLDL: 11 mg/dL (ref 0.0–40.0)

## 2022-07-08 LAB — CBC WITH DIFFERENTIAL/PLATELET
Basophils Absolute: 0.1 10*3/uL (ref 0.0–0.1)
Basophils Relative: 0.7 % (ref 0.0–3.0)
Eosinophils Absolute: 0.2 10*3/uL (ref 0.0–0.7)
Eosinophils Relative: 2.2 % (ref 0.0–5.0)
HCT: 39.4 % (ref 36.0–46.0)
Hemoglobin: 12.7 g/dL (ref 12.0–15.0)
Lymphocytes Relative: 21.8 % (ref 12.0–46.0)
Lymphs Abs: 1.9 10*3/uL (ref 0.7–4.0)
MCHC: 32.3 g/dL (ref 30.0–36.0)
MCV: 88.2 fl (ref 78.0–100.0)
Monocytes Absolute: 0.7 10*3/uL (ref 0.1–1.0)
Monocytes Relative: 7.6 % (ref 3.0–12.0)
Neutro Abs: 6.1 10*3/uL (ref 1.4–7.7)
Neutrophils Relative %: 67.7 % (ref 43.0–77.0)
Platelets: 288 10*3/uL (ref 150.0–400.0)
RBC: 4.46 Mil/uL (ref 3.87–5.11)
RDW: 14.2 % (ref 11.5–15.5)
WBC: 8.9 10*3/uL (ref 4.0–10.5)

## 2022-07-08 LAB — COMPREHENSIVE METABOLIC PANEL
ALT: 8 U/L (ref 0–35)
AST: 14 U/L (ref 0–37)
Albumin: 3.9 g/dL (ref 3.5–5.2)
Alkaline Phosphatase: 62 U/L (ref 39–117)
BUN: 11 mg/dL (ref 6–23)
CO2: 26 mEq/L (ref 19–32)
Calcium: 9.7 mg/dL (ref 8.4–10.5)
Chloride: 106 mEq/L (ref 96–112)
Creatinine, Ser: 0.76 mg/dL (ref 0.40–1.20)
GFR: 89.33 mL/min (ref >60.00)
Glucose, Bld: 95 mg/dL (ref 70–99)
Potassium: 4.4 mEq/L (ref 3.5–5.1)
Sodium: 142 mEq/L (ref 135–145)
Total Bilirubin: 0.4 mg/dL (ref 0.2–1.2)
Total Protein: 6.9 g/dL (ref 6.0–8.3)

## 2022-07-08 LAB — VITAMIN D 25 HYDROXY (VIT D DEFICIENCY, FRACTURES): VITD: 31.59 ng/mL (ref 30.00–100.00)

## 2022-07-08 LAB — HEMOGLOBIN A1C: Hgb A1c MFr Bld: 5.9 % (ref 4.6–6.5)

## 2022-07-08 LAB — TSH: TSH: 1.06 u[IU]/mL (ref 0.35–5.50)

## 2022-07-08 MED ORDER — ZEPBOUND 2.5 MG/0.5ML ~~LOC~~ SOAJ: 2.5000 mg | SUBCUTANEOUS | 1 refills | Status: DC

## 2022-07-08 MED ORDER — AMLODIPINE BESYLATE 10 MG PO TABS: 10.0000 mg | ORAL_TABLET | Freq: Every day | ORAL | 3 refills | Status: DC

## 2022-07-08 NOTE — Patient Instructions (Addendum)
start Zepbound 2.'5mg'$  once per week injected subcutaneously ( [place])  in stomach. Please clean with alcohol swab prior to injection and be sure to rotate site. You may schedule a nurse visit if you would like to first injection.   After 4 weeks, and if tolerated and weight loss has not reached 1-2 lbs per week, please increase to '5mg'$  once per week [place].  Once you are actively losing weight, you do not need to further increase medication.  Dose increments are below.   7.5 mg/0.5 mL (0.5 mL) 10 mg/0.5 mL (0.5 mL) 12.5 mg/0.5 mL (0.5 mL) 15 mg/0.5 mL (0.5 mL)   Please read information on medication below and remember black box warning that you may not take if you or a family member is diagnosed with thyroid cancer (medullary thyroid cancer), or multiple endocrine neoplasia.       Brand Names: [place] Mounjaro; Zepbound Brand Names: [place] Mounjaro  Warning  This drug has been shown to cause thyroid cancer in some animals. It is not known if this happens in humans. If thyroid cancer happens, it may be deadly if not found and treated early. Call your doctor right away if you have a neck mass, trouble breathing, trouble swallowing, or have hoarseness that will not go away.  Do not use this drug if you have a health problem called Multiple Endocrine Neoplasia syndrome type 2 (MEN 2), or if you or a family member have had thyroid cancer.  Have your blood work checked and thyroid ultrasounds as you have been told by your doctor. What is this drug used for?  It is used to lower blood sugar in people with type 2 diabetes.  It is used to help with weight loss in certain people. What do I need to tell my doctor BEFORE I take this drug? All products:  If you are allergic to this drug; any part of this drug; or any other drugs, foods, or substances. Tell your doctor about the allergy and what signs you had.  If you have ever had pancreatitis.  If you have stomach or bowel problems.  If you are using another  drug that has the same drug in it.  If you are using another drug like this one. If you are not sure, ask your doctor or pharmacist. If you are using this drug for diabetes:  If you have type 1 diabetes. Do not use this drug to treat type 1 diabetes. Zepbound:  If you have or have ever had depression or thoughts of suicide. This is not a list of all drugs or health problems that interact with this drug. Tell your doctor and pharmacist about all of your drugs (prescription or OTC, natural products, vitamins) and health problems. You must check to make sure that it is safe for you to take this drug with all of your drugs and health problems. Do not start, stop, or change the dose of any drug without checking with your doctor. What are some things I need to know or do while I take this drug? All products:  Tell all of your health care providers that you take this drug. This includes your doctors, nurses, pharmacists, and dentists.  Follow the diet and workout plan that your doctor told you about.  Talk with your doctor before you drink alcohol.  Birth control pills may not work as well to prevent pregnancy. If you take birth control pills, you may need to switch to another type of hormone-based birth  control like a vaginal ring if your doctor tells you to. If another type of hormone-based birth control is not an option, use some other kind of birth control also, like a condom. Do this for 4 weeks after starting this drug and for 4 weeks each time the dose is raised.  This drug may prevent other drugs taken by mouth from getting into the body. If you take other drugs by mouth, you may need to take them at some other time than this drug. Talk with your doctor.  Do not share with another person even if the needle has been changed. Sharing your tray or pen may pass infections from one person to another. This includes infections you may not know you have.  If you cannot drink liquids by mouth or if you have  upset stomach, throwing up, or diarrhea that does not go away; you need to avoid getting dehydrated. Contact your doctor to find out what to do. Dehydration may lead to low blood pressure or to new or worse kidney problems.  A severe and sometimes deadly pancreas problem (pancreatitis) has happened with other drugs like this one. If you are using this drug for diabetes:  It may be harder to control blood sugar during times of stress such as fever, infection, injury, or surgery. A change in physical activity, exercise, or diet may also affect blood sugar.  Check your blood sugar as you have been told by your doctor.  Do not drive if your blood sugar has been low. There is a greater chance of you having a crash.  Wear disease medical alert ID (identification).  Tell your doctor if you are pregnant, plan on getting pregnant, or are breast-feeding. You will need to talk about the benefits and risks to you and the baby. Zepbound:  If you have high blood sugar (diabetes), you will need to watch your blood sugar closely.  Weight loss during pregnancy may cause harm to the unborn baby. If you get pregnant while taking this drug or if you want to get pregnant, call your doctor right away.  Tell your doctor if you are breast-feeding. You will need to talk about any risks to your baby. What are some side effects that I need to call my doctor about right away? WARNING/CAUTION: Even though it may be rare, some people may have very bad and sometimes deadly side effects when taking a drug. Tell your doctor or get medical help right away if you have any of the following signs or symptoms that may be related to a very bad side effect: All products:  Signs of an allergic reaction, like rash; hives; itching; red, swollen, blistered, or peeling skin with or without fever; wheezing; tightness in the chest or throat; trouble breathing, swallowing, or talking; unusual hoarseness; or swelling of the mouth, face, lips,  tongue, or throat.  Signs of kidney problems like unable to pass urine, change in how much urine is passed, blood in the urine, or a big weight gain.  Signs of gallbladder problems like pain in the upper right belly area, right shoulder area, or between the shoulder blades; yellow skin or eyes; fever with chills; bloating; or very upset stomach or throwing up.  Signs of a pancreas problem (pancreatitis) like very bad stomach pain, very bad back pain, or very bad upset stomach or throwing up.  Dizziness or passing out.  A fast heartbeat.  Change in eyesight.  Low blood sugar can happen. The chance may be  raised when this drug is used with other drugs for diabetes. Signs may be dizziness, headache, feeling sleepy or weak, shaking, fast heartbeat, confusion, hunger, or sweating. Call your doctor right away if you have any of these signs. Follow what you have been told to do for low blood sugar. This may include taking glucose tablets, liquid glucose, or some fruit juices. Zepbound:  New or worse behavior or mood changes like depression or thoughts of suicide. What are some other side effects of this drug? All drugs may cause side effects. However, many people have no side effects or only have minor side effects. Call your doctor or get medical help if any of these side effects or any other side effects bother you or do not go away: All products:  Constipation, diarrhea, stomach pain, upset stomach, throwing up, or decreased appetite.  Heartburn.  Pain, itching, or other irritation where the injection was given. Zepbound:  Feeling tired or weak. These are not all of the side effects that may occur. If you have questions about side effects, call your doctor. Call your doctor for medical advice about side effects. You may report side effects to your national health agency. How is this drug best taken? Use this drug as ordered by your doctor. Read all information given to you. Follow all instructions  closely. All products:  It is given as a shot into the fatty part of the skin on the top of the thigh, belly area, or upper arm.  If you will be giving yourself the shot, your doctor or nurse will teach you how to give the shot.  Keep taking this drug as you have been told by your doctor or other health care provider, even if you feel well.  Take the same day each week.  Move site where you give the shot each time.  Take with or without food.  Wash your hands before and after use.  Do not use if the solution is leaking or has particles.  This drug is colorless to a faint yellow. Do not use if the solution changes color.  Do not move this drug from the pen to a syringe.  Each pen or vial is for 1 use only. Throw away any part of the used pen after the dose is given.  Throw away needles in a needle/sharp disposal box. Do not reuse needles or other items. When the box is full, follow all local rules for getting rid of it. Talk with a doctor or pharmacist if you have any questions. If you are using this drug for diabetes:  If you are also using insulin, you may inject this drug and the insulin in the same area of the body but not right next to each other.  Do not mix this drug in the same syringe with insulin. What do I do if I miss a dose?  If it is within 4 days after the missed dose, take the missed dose and go back to your normal day.  If it has been more than 4 days since the missed dose, skip the missed dose and go back to your normal day.  Do not take 2 doses at the same time or extra doses. How do I store and/or throw out this drug?  Store in a refrigerator. Do not freeze.  Do not use if it has been frozen.  If needed, each pen or vial may be stored at room temperature for up to 21 days. If you store  at room temperature, throw away any part not used after 21 days.  Protect from heat.  Store in the original container to protect from light.  Keep all drugs in a safe place. Keep all drugs  out of the reach of children and pets.  Throw away unused or expired drugs. Do not flush down a toilet or pour down a drain unless you are told to do so. Check with your pharmacist if you have questions about the best way to throw out drugs. There may be drug take-back programs in your area. General drug facts  If your symptoms or health problems do not get better or if they become worse, call your doctor.  Do not share your drugs with others and do not take anyone else's drugs.  Some drugs may have another patient information leaflet. If you have any questions about this drug, please talk with your doctor, nurse, pharmacist, or other health care provider.  If you think there has been an overdose, call your poison control center or get medical care right away. Be ready to tell or show what was taken, how much, and when it happened. Last Reviewed Date 2022-05-23 Consumer Information Use and Disclaimer This generalized information is a limited summary of diagnosis, treatment, and/or medication information. It is not meant to be comprehensive and should be used as a tool to help the user understand and/or assess potential diagnostic and treatment options. It does NOT include all information about conditions, treatments, medications, side effects, or risks that may apply to a specific patient. It is not intended to be medical advice or a substitute for the medical advice, diagnosis, or treatment of a health care provider based on the health care provider's examination and assessment of a patient's specific and unique circumstances. Patients must speak with a health care provider for complete information about their health, medical questions, and treatment options, including any risks or benefits regarding use of medications. This information does not endorse any treatments or medications as safe, effective, or approved for treating a specific patient. [employer] and its affiliates disclaim any warranty or  liability relating to this information or the use thereof. The use of this information is governed by the Terms of Use, available at https://www.wolterskluwer.com/en/know/clinical-effectiveness-terms.  [employer]. and its affiliates and/or [place]. All rights reserved. Use of Up[employer]Date is subject to the Terms of Use. Topic 062376 Version 16.0 Health Maintenance for Postmenopausal Women Menopause is a normal process in which your ability to get pregnant comes to an end. This process happens slowly over many months or years, usually between the ages of 4 and 25. Menopause is complete when you have missed your menstrual period for 12 months. It is important to talk with your health care provider about some of the most common conditions that affect women after menopause (postmenopausal women). These include heart disease, cancer, and bone loss (osteoporosis). Adopting a healthy lifestyle and getting preventive care can help to promote your health and wellness. The actions you take can also lower your chances of developing some of these common conditions. What are the signs and symptoms of menopause? During menopause, you may have the following symptoms: Hot flashes. These can be moderate or severe. Night sweats. Decrease in sex drive. Mood swings. Headaches. Tiredness (fatigue). Irritability. Memory problems. Problems falling asleep or staying asleep. Talk with your health care provider about treatment options for your symptoms. Do I need hormone replacement therapy? Hormone replacement therapy is effective in treating symptoms that are  caused by menopause, such as hot flashes and night sweats. Hormone replacement carries certain risks, especially as you become older. If you are thinking about using estrogen or estrogen with progestin, discuss the benefits and risks with your health care provider. How can I reduce my risk for heart disease and stroke? The risk of heart disease, heart  attack, and stroke increases as you age. One of the causes may be a change in the body's hormones during menopause. This can affect how your body uses dietary fats, triglycerides, and cholesterol. Heart attack and stroke are medical emergencies. There are many things that you can do to help prevent heart disease and stroke. Watch your blood pressure High blood pressure causes heart disease and increases the risk of stroke. This is more likely to develop in people who have high blood pressure readings or are overweight. Have your blood pressure checked: Every 3-5 years if you are 33-58 years of age. Every year if you are 37 years old or older. Eat a healthy diet  Eat a diet that includes plenty of vegetables, fruits, low-fat dairy products, and lean protein. Do not eat a lot of foods that are high in solid fats, added sugars, or sodium. Get regular exercise Get regular exercise. This is one of the most important things you can do for your health. Most adults should: Try to exercise for at least 150 minutes each week. The exercise should increase your heart rate and make you sweat (moderate-intensity exercise). Try to do strengthening exercises at least twice each week. Do these in addition to the moderate-intensity exercise. Spend less time sitting. Even light physical activity can be beneficial. Other tips Work with your health care provider to achieve or maintain a healthy weight. Do not use any products that contain nicotine or tobacco. These products include cigarettes, chewing tobacco, and vaping devices, such as e-cigarettes. If you need help quitting, ask your health care provider. Know your numbers. Ask your health care provider to check your cholesterol and your blood sugar (glucose). Continue to have your blood tested as directed by your health care provider. Do I need screening for cancer? Depending on your health history and family history, you may need to have cancer screenings at  different stages of your life. This may include screening for: Breast cancer. Cervical cancer. Lung cancer. Colorectal cancer. What is my risk for osteoporosis? After menopause, you may be at increased risk for osteoporosis. Osteoporosis is a condition in which bone destruction happens more quickly than new bone creation. To help prevent osteoporosis or the bone fractures that can happen because of osteoporosis, you may take the following actions: If you are 67-43 years old, get at least 1,000 mg of calcium and at least 600 international units (IU) of vitamin D per day. If you are older than age 62 but younger than age 37, get at least 1,200 mg of calcium and at least 600 international units (IU) of vitamin D per day. If you are older than age 50, get at least 1,200 mg of calcium and at least 800 international units (IU) of vitamin D per day. Smoking and drinking excessive alcohol increase the risk of osteoporosis. Eat foods that are rich in calcium and vitamin D, and do weight-bearing exercises several times each week as directed by your health care provider. How does menopause affect my mental health? Depression may occur at any age, but it is more common as you become older. Common symptoms of depression include: Feeling depressed. Changes  in sleep patterns. Changes in appetite or eating patterns. Feeling an overall lack of motivation or enjoyment of activities that you previously enjoyed. Frequent crying spells. Talk with your health care provider if you think that you are experiencing any of these symptoms. General instructions See your health care provider for regular wellness exams and vaccines. This may include: Scheduling regular health, dental, and eye exams. Getting and maintaining your vaccines. These include: Influenza vaccine. Get this vaccine each year before the flu season begins. Pneumonia vaccine. Shingles vaccine. Tetanus, diphtheria, and pertussis (Tdap) booster  vaccine. Your health care provider may also recommend other immunizations. Tell your health care provider if you have ever been abused or do not feel safe at home. Summary Menopause is a normal process in which your ability to get pregnant comes to an end. This condition causes hot flashes, night sweats, decreased interest in sex, mood swings, headaches, or lack of sleep. Treatment for this condition may include hormone replacement therapy. Take actions to keep yourself healthy, including exercising regularly, eating a healthy diet, watching your weight, and checking your blood pressure and blood sugar levels. Get screened for cancer and depression. Make sure that you are up to date with all your vaccines. This information is not intended to replace advice given to you by your health care provider. Make sure you discuss any questions you have with your health care provider. Document Revised: 11/12/2020 Document Reviewed: 11/12/2020 Elsevier Patient Education  [place].

## 2022-07-08 NOTE — Assessment & Plan Note (Addendum)
Deferred pelvic exam in the absence of complaints and Pap smear is up-to-date with Dr. Marcelline Mates.  Clinical breast exam performed today.

## 2022-07-08 NOTE — Assessment & Plan Note (Signed)
BMI 45.  We agreed to start Zepbound.  No personal or family history medullary thyroid cancer, multiple endocrine neoplasia as we discussed blackbox warning in detail. We discussed mechanism of action, administration.  Close follow-up

## 2022-07-08 NOTE — Assessment & Plan Note (Signed)
Chronic, stable.  Continue amlodipine 10 mg

## 2022-07-08 NOTE — Progress Notes (Signed)
Assessment & Plan:  Routine general medical examination at a health care facility Assessment & Plan: Deferred pelvic exam in the absence of complaints and Pap smear is up-to-date with Dr. Marcelline Mates.  Clinical breast exam performed today.  Orders: -     TSH -     amLODIPine Besylate; Take 1 tablet (10 mg total) by mouth daily.  Dispense: 90 tablet; Refill: 3 -     Hemoglobin A1c  Elevated blood pressure reading -     amLODIPine Besylate; Take 1 tablet (10 mg total) by mouth daily.  Dispense: 90 tablet; Refill: 3  Obesity, unspecified classification, unspecified obesity type, unspecified whether serious comorbidity present Assessment & Plan: BMI 45.  We agreed to start Zepbound.  No personal or family history medullary thyroid cancer, multiple endocrine neoplasia as we discussed blackbox warning in detail. We discussed mechanism of action, administration.  Close follow-up  Orders: -     TSH -     Comprehensive metabolic panel -     CBC with Differential/Platelet -     Lipid panel -     VITAMIN D 25 Hydroxy (Vit-D Deficiency, Fractures) -     Zepbound; Inject 2.5 mg into the skin once a week.  Dispense: 3 mL; Refill: 1 -     Hemoglobin A1c  Hypertension, unspecified type Assessment & Plan: Chronic, stable.  Continue amlodipine 10 mg   History of vitamin D deficiency -     VITAMIN D 25 Hydroxy (Vit-D Deficiency, Fractures)     Return precautions given.   Risks, benefits, and alternatives of the medications and treatment plan prescribed today were discussed, and patient expressed understanding.   Education regarding symptom management and diagnosis given to patient on AVS either electronically or printed.  Return in about 6 weeks (around 08/19/2022).  Lorraine Paris, FNP  Subjective:    Patient ID: Lorraine Holmes, female    DOB: [birth year], 52 y.o.   MRN: [id number]  CC: Lorraine Holmes is a 52 y.o. female who presents today for physical exam.    HPI: She is drinking more water  and eating healthier.  She is planning on bilateral knee replacement however per patient,Dr. Mack Guise requested 25 pound weight loss.  Walking is limited by knee pain.  She is interested in weight loss medication.  Previously insurance denied Saxenda, Victoza.  History of prediabetes.    Colorectal Cancer Screening: UTD , 07/29/19 , Dr. Marius Ditch, repeat in 7 years Breast Cancer Screening: Mammogram scheduled.  History of right breast cancer, status post lumpectomy Cervical Cancer Screening: UTD, Dr Marcelline Mates 02/21/22 Bone Health screening/DEXA for 65+: No increased fracture risk. Defer screening at this time.  Lung Cancer Screening: Doesn't have 20 year pack year history and age > 54 years yo 52 years          Tetanus - UTD         Labs: Screening labs today. Exercise: No regular exercise.   Alcohol use:  occassional Smoking/tobacco use: Nonsmoker.    Health Maintenance  Topic Date Due   COVID-19 Vaccine (3 - Pfizer risk series) 11/04/2019   INFLUENZA VACCINE  02/04/2022   MAMMOGRAM  09/04/2023   PAP SMEAR-Modifier  02/21/2025   COLONOSCOPY (Pts 45-39yr Insurance coverage will need to be confirmed)  07/17/2026   DTaP/Tdap/Td (2 - Td or Tdap) 05/14/2027   Hepatitis C Screening  Completed   HIV Screening  Completed   Zoster Vaccines- Shingrix  Completed   HPV VACCINES  Aged Out  ALLERGIES: No healthtouch food allergies  Current Outpatient Medications on File Prior to Visit  Medication Sig Dispense Refill   cetirizine (ZYRTEC) 10 MG tablet Take 10 mg by mouth daily.     cholecalciferol (VITAMIN D3) 25 MCG (1000 UNIT) tablet Take 1,000 Units by mouth daily.     tamoxifen (NOLVADEX) 20 MG tablet Take 1 tablet (20 mg total) by mouth daily. 90 tablet 2   No current facility-administered medications on file prior to visit.    Review of Systems  Constitutional:  Negative for chills, fever and unexpected weight change.  HENT:  Negative for congestion and trouble swallowing.    Respiratory:  Negative for cough.   Cardiovascular:  Negative for chest pain, palpitations and leg swelling.  Gastrointestinal:  Negative for nausea and vomiting.  Musculoskeletal:  Positive for arthralgias (knee pain). Negative for myalgias.  Skin:  Negative for rash.  Neurological:  Negative for headaches.  Hematological:  Negative for adenopathy.  Psychiatric/Behavioral:  Negative for confusion.       Objective:    BP 128/78   Pulse 85   Temp 98.1 F (36.7 C) (Oral)   Ht '5\' 6"'$  (1.676 m)   Wt 279 lb 9.6 oz (126.8 kg)   SpO2 96%   BMI 45.13 kg/m   BP Readings from Last 3 Encounters:  07/08/22 128/78  04/23/22 (!) 136/58  02/21/22 (!) 134/93   Wt Readings from Last 3 Encounters:  07/08/22 279 lb 9.6 oz (126.8 kg)  04/23/22 280 lb (127 kg)  02/21/22 281 lb 1.6 oz (127.5 kg)    Physical Exam Vitals reviewed.  Constitutional:      Appearance: Normal appearance. She is well-developed.  Eyes:     Conjunctiva/sclera: Conjunctivae normal.  Neck:     Thyroid: No thyroid mass or thyromegaly.  Cardiovascular:     Rate and Rhythm: Normal rate and regular rhythm.     Pulses: Normal pulses.     Heart sounds: Normal heart sounds.  Pulmonary:     Effort: Pulmonary effort is normal.     Breath sounds: Normal breath sounds. No wheezing, rhonchi or rales.  Chest:  Breasts:    Breasts are symmetrical.     Right: No inverted nipple, mass, nipple discharge, skin change or tenderness.     Left: No inverted nipple, mass, nipple discharge, skin change or tenderness.  Abdominal:     General: Bowel sounds are normal. There is no distension.     Palpations: Abdomen is soft. Abdomen is not rigid. There is no fluid wave or mass.     Tenderness: There is no abdominal tenderness. There is no guarding or rebound.  Lymphadenopathy:     Head:     Right side of head: No submental, submandibular, tonsillar, preauricular, posterior auricular or occipital adenopathy.     Left side of head: No  submental, submandibular, tonsillar, preauricular, posterior auricular or occipital adenopathy.     Cervical: No cervical adenopathy.     Right cervical: No superficial, deep or posterior cervical adenopathy.    Left cervical: No superficial, deep or posterior cervical adenopathy.  Skin:    General: Skin is warm and dry.  Neurological:     Mental Status: She is alert.  Psychiatric:        Speech: Speech normal.        Behavior: Behavior normal.        Thought Content: Thought content normal.

## 2022-07-09 NOTE — Telephone Encounter (Signed)
Patient called and said that her pharmacy needs Arnett to prescribe an alternative medication.

## 2022-07-11 ENCOUNTER — Other Ambulatory Visit: Payer: Self-pay | Admitting: Family

## 2022-07-11 DIAGNOSIS — E669 Obesity, unspecified: Secondary | ICD-10-CM

## 2022-07-11 MED ORDER — WEGOVY 0.25 MG/0.5ML ~~LOC~~ SOAJ: 0.2500 mg | SUBCUTANEOUS | 2 refills | Status: DC

## 2022-07-22 ENCOUNTER — Other Ambulatory Visit: Payer: Self-pay

## 2022-07-24 ENCOUNTER — Other Ambulatory Visit (HOSPITAL_COMMUNITY): Payer: Self-pay

## 2022-07-24 ENCOUNTER — Telehealth: Payer: Self-pay

## 2022-07-24 NOTE — Telephone Encounter (Signed)
ERROR

## 2022-07-24 NOTE — Telephone Encounter (Signed)
Patient Advocate Encounter   Received notification from [place] that prior authorization for Wegovy 0.'25mg'$ /0.22m is required.   PA submitted on 07/24/2022 Key [id number] Status is pending

## 2022-07-24 NOTE — Telephone Encounter (Signed)
Spoke to patient and pt and I formed her that PA for [facility] was started as of 07/24/22 and that I will contact her as soon as we hear back

## 2022-08-20 ENCOUNTER — Other Ambulatory Visit (HOSPITAL_COMMUNITY): Payer: Self-pay

## 2022-08-20 ENCOUNTER — Ambulatory Visit: Payer: BC Managed Care – PPO | Admitting: Family

## 2022-08-25 ENCOUNTER — Other Ambulatory Visit (HOSPITAL_COMMUNITY): Payer: Self-pay

## 2022-09-04 ENCOUNTER — Ambulatory Visit
Admission: RE | Admit: 2022-09-04 | Discharge: 2022-09-04 | Disposition: A | Discharge status: Home / Self Care | Payer: BC Managed Care – PPO | Source: Ambulatory Visit | Attending: Medical Oncology | Admitting: Medical Oncology

## 2022-09-04 DIAGNOSIS — R922 Inconclusive mammogram: Secondary | ICD-10-CM | POA: Diagnosis not present

## 2022-09-04 DIAGNOSIS — D0511 Intraductal carcinoma in situ of right breast: Secondary | ICD-10-CM | POA: Insufficient documentation

## 2022-09-09 ENCOUNTER — Other Ambulatory Visit (HOSPITAL_COMMUNITY): Payer: Self-pay

## 2022-09-09 ENCOUNTER — Telehealth: Payer: Self-pay

## 2022-09-09 NOTE — Telephone Encounter (Signed)
Mychart message sent to advise pt Saxenda denied

## 2022-09-09 NOTE — Telephone Encounter (Signed)
   Apologies for the delay, followed up on CMM and saw this message. Attempted another PA to ensure it wasn't just an error and it looks like her plan might have opted out of weightloss medication. Pt can call her insurance to confirm or advise.

## 2022-09-11 ENCOUNTER — Encounter: Payer: Self-pay | Admitting: Surgery

## 2022-09-11 ENCOUNTER — Ambulatory Visit (INDEPENDENT_AMBULATORY_CARE_PROVIDER_SITE_OTHER): Payer: BC Managed Care – PPO | Admitting: Surgery

## 2022-09-11 ENCOUNTER — Telehealth: Payer: Self-pay | Admitting: Family

## 2022-09-11 VITALS — BP 154/77 | HR 89 | Ht 66.0 in | Wt 285.0 lb

## 2022-09-11 DIAGNOSIS — D0511 Intraductal carcinoma in situ of right breast: Secondary | ICD-10-CM | POA: Diagnosis not present
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
## 2022-09-11 NOTE — Telephone Encounter (Signed)
Patient dropped off document Handicap Placard, to be filled out by provider. Patient requested to send it via Call Patient to pick up within 7-days. Document is located in providers color folder at front office.Please advise at Mobile [phone] (mobile)

## 2022-09-11 NOTE — Addendum Note (Signed)
Addended by: [place], Dimitra Woodstock on: 09/11/2022 09:39 AM   Modules accepted: Orders

## 2022-09-11 NOTE — Telephone Encounter (Signed)
Patient would like all weight loss medications removed from her chart, since her insurance will not cover weight loss medications.

## 2022-09-11 NOTE — Telephone Encounter (Signed)
erroe

## 2022-09-11 NOTE — Telephone Encounter (Signed)
Placed in providers folder for signature.  

## 2022-09-11 NOTE — Patient Instructions (Addendum)
The patient has been asked to return to the office in one year with a bilateral diagnostic mammogram. 

## 2022-10-22 ENCOUNTER — Other Ambulatory Visit (HOSPITAL_COMMUNITY): Payer: Self-pay

## 2022-10-24 ENCOUNTER — Encounter: Payer: Self-pay | Admitting: Oncology

## 2022-10-24 ENCOUNTER — Inpatient Hospital Stay: Payer: BC Managed Care – PPO | Attending: Oncology | Admitting: Oncology

## 2022-10-24 ENCOUNTER — Inpatient Hospital Stay: Payer: BC Managed Care – PPO

## 2022-10-24 VITALS — BP 132/65 | HR 84 | Temp 98.1°F | Resp 18 | Ht 66.0 in | Wt 287.0 lb

## 2022-10-24 DIAGNOSIS — Z5181 Encounter for therapeutic drug level monitoring: Secondary | ICD-10-CM

## 2022-10-24 DIAGNOSIS — Z08 Encounter for follow-up examination after completed treatment for malignant neoplasm: Secondary | ICD-10-CM

## 2022-10-24 DIAGNOSIS — Z923 Personal history of irradiation: Secondary | ICD-10-CM | POA: Diagnosis not present

## 2022-10-24 DIAGNOSIS — Z803 Family history of malignant neoplasm of breast: Secondary | ICD-10-CM | POA: Insufficient documentation

## 2022-10-24 DIAGNOSIS — D0511 Intraductal carcinoma in situ of right breast: Secondary | ICD-10-CM | POA: Insufficient documentation

## 2022-10-24 DIAGNOSIS — Z79810 Long term (current) use of selective estrogen receptor modulators (SERMs): Secondary | ICD-10-CM | POA: Insufficient documentation

## 2022-10-24 NOTE — Progress Notes (Signed)
Hematology/Oncology Consult note [facility]  Telephone:([phone] [phone]  Patient Care Team: Allegra Grana, FNP as PCP - General (Family Medicine) Creig Hines, MD as Consulting Physician (Oncology) Campbell Lerner, MD as Consulting Physician (General Surgery) Carmina Miller, MD as Consulting Physician (Radiation Oncology) Hildred Laser, MD as Consulting Physician (Obstetrics and Gynecology)   Name of the patient: Lorraine Holmes  [id number]  10/04/1970   Date of visit: 10/24/22  Diagnosis- right breast DCIS ER positive    Chief complaint/ Reason for visit-routine follow-up of DCIS on tamoxifen  Heme/Onc history: Patient is a 52 year old female who was found to have calcifications in the right breast on a screening mammogram that was followed by diagnostic mammogram and ultrasound.  Showed 0.8 cm of indeterminate calcification which was biopsied and consistent with high-grade DCIS comedo type.  She underwent Lumpectomy which showed high-grade DCIS which was negative for any invasive carcinoma.  Margins negative with 2 mm ER positive pTis.   Patient has met with radiation oncology and will be going for adjuvant radiation treatment as well.  She has a Mirena in place for the last 7 years and has not had any menstrual cycles on Mirena.  Mirena taken out in June 2022 and no menstrual cycle since then.  Patient started taking tamoxifen in July 2022  Interval history-patient has not had any vaginal bleeding or spotting.  Tolerating tamoxifen well without any significant side effects.  ECOG PS- 0 Pain scale- 0   Review of systems- Review of Systems  Constitutional:  Negative for chills, fever, malaise/fatigue and weight loss.  HENT:  Negative for congestion, ear discharge and nosebleeds.   Eyes:  Negative for blurred vision.  Respiratory:  Negative for cough, hemoptysis, sputum production, shortness of breath and wheezing.   Cardiovascular:   Negative for chest pain, palpitations, orthopnea and claudication.  Gastrointestinal:  Negative for abdominal pain, blood in stool, constipation, diarrhea, heartburn, melena, nausea and vomiting.  Genitourinary:  Negative for dysuria, flank pain, frequency, hematuria and urgency.  Musculoskeletal:  Negative for back pain, joint pain and myalgias.  Skin:  Negative for rash.  Neurological:  Negative for dizziness, tingling, focal weakness, seizures, weakness and headaches.  Endo/Heme/Allergies:  Does not bruise/bleed easily.  Psychiatric/Behavioral:  Negative for depression and suicidal ideas. The patient does not have insomnia.       Allergies  Allergen Reactions   No Healthtouch Food Allergies Hives    Many food allergies.  Takes zyrtec for hives everyday     Past Medical History:  Diagnosis Date   Anemia    Arthritis    Bilateral knee   Breast cancer    Ductal carcinoma in situ (DCIS) of right breast   Breast cancer    Ductal carcinoma in situ (DCIS) of right breast 10/05/2020   History of blood transfusion    History of chicken pox    Hypertension    Obesity    bmi 44   Personal history of radiation therapy      Past Surgical History:  Procedure Laterality Date   BREAST BIOPSY Right 09/27/2020   stereo bx, ribbon clip, positive   BREAST LUMPECTOMY Right 10/24/2020   lumpectomy High grade DCIS   BREAST LUMPECTOMY WITH RADIOFREQUENCY TAG IDENTIFICATION Right 10/24/2020   Procedure: BREAST LUMPECTOMY WITH RADIOFREQUENCY TAG IDENTIFICATION;  Surgeon: Campbell Lerner, MD;  Location: [facility] ORS;  Service: General;  Laterality: Right;   COLONOSCOPY WITH PROPOFOL N/A 07/18/2019   Procedure: COLONOSCOPY  WITH PROPOFOL;  Surgeon: Toney Reil, MD;  Location: [facility] ENDOSCOPY;  Service: Gastroenterology;  Laterality: N/A;   FOOT FRACTURE SURGERY Left    2002.  metal in toe   JOINT REPLACEMENT     KNEE ARTHROSCOPY WITH MEDIAL MENISECTOMY Left 11/28/2014   Procedure: KNEE  ARTHROSCOPY WITH MEDIAL MENISECTOMY;  Surgeon: Kennedy Bucker, MD;  Location: [facility] ORS;  Service: Orthopedics;  Laterality: Left;   MEDIAL PARTIAL KNEE REPLACEMENT Right 2013   meniscus tear Right 2012    Social History   Socioeconomic History   Marital status: Single    Spouse name: Not on file   Number of children: 2   Years of education: 16   Highest education level: Not on file  Occupational History   Occupation: Agricultural engineer: [employer]    Comment: [employer]  Tobacco Use   Smoking status: Never    Passive exposure: Past   Smokeless tobacco: Never  Vaping Use   Vaping Use: Never used  Substance and Sexual Activity   Alcohol use: Yes    Alcohol/week: 2.0 standard drinks of alcohol    Types: 2 Standard drinks or equivalent per week    Comment: occ   Drug use: No   Sexual activity: Yes    Comment: mirena  Other Topics Concern   Not on file  Social History Narrative   Lorraine Holmes grew up in [place]. She lives at home with her children and her mother. 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       Working [employer] in [place].       Works from home      Social Determinants of Corporate investment banker Strain: Not on [employer] Insecurity: Not on file  Transportation Needs: Not on file  Physical Activity: Not on file  Stress: Not on file  Social Connections: Not on file  Intimate Partner Violence: Not on file    Family History  Problem Relation Age of Onset   Hypertension Mother    Diabetes Mother    Hypertension Father    Heart disease Father 65       CAD - died MI   Diabetes Maternal Grandmother    Breast cancer Maternal Aunt        great aunt   Colon cancer Neg Hx    Thyroid cancer Neg Hx      Current Outpatient Medications:    amLODipine (NORVASC) 10 MG tablet, Take 1 tablet (10 mg total) by mouth daily., Disp: 90 tablet, Rfl: 3   cetirizine (ZYRTEC) 10 MG  tablet, Take 10 mg by mouth daily., Disp: , Rfl:    cholecalciferol (VITAMIN D3) 25 MCG (1000 UNIT) tablet, Take 1,000 Units by mouth daily., Disp: , Rfl:    tamoxifen (NOLVADEX) 20 MG tablet, Take 1 tablet (20 mg total) by mouth daily., Disp: 90 tablet, Rfl: 2  Physical exam:  Vitals:   10/24/22 1400  BP: 132/65  Pulse: 84  Resp: 18  Temp: 98.1 F (36.7 C)  TempSrc: Tympanic  SpO2: 100%  Weight: 287 lb (130.2 kg)  Height: 5\' 6"  (1.676 m)   Physical Exam Cardiovascular:     Rate and Rhythm: Normal rate and regular rhythm.     Heart sounds: Normal heart sounds.  Pulmonary:     Effort: Pulmonary effort is normal.     Breath sounds: Normal breath sounds.  Abdominal:  General: Bowel sounds are normal.     Palpations: Abdomen is soft.  Skin:    General: Skin is warm and dry.  Neurological:     Mental Status: She is alert and oriented to person, place, and time.    Breast exam was performed in seated and lying down position. Patient is status post right lumpectomy with a well-healed surgical scar. No evidence of any palpable masses. No evidence of axillary adenopathy. No evidence of any palpable masses or lumps in the left breast. No evidence of leftt axillary adenopathy      Latest Ref Rng & Units 07/08/2022    9:43 AM  CMP  Glucose 70 - 99 mg/dL 95   BUN 6 - 23 mg/dL 11   Creatinine 1.61 - 1.20 mg/dL 0.96   Sodium 045 - 409 mEq/L 142   Potassium 3.5 - 5.1 mEq/L 4.4   Chloride 96 - 112 mEq/L 106   CO2 19 - 32 mEq/L 26   Calcium 8.4 - 10.5 mg/dL 9.7   Total Protein 6.0 - 8.3 g/dL 6.9   Total Bilirubin 0.2 - 1.2 mg/dL 0.4   Alkaline Phos 39 - 117 U/L 62   AST 0 - 37 U/L 14   ALT 0 - 35 U/L 8       Latest Ref Rng & Units 07/08/2022    9:43 AM  CBC  WBC 4.0 - 10.5 K/uL 8.9   Hemoglobin 12.0 - 15.0 g/dL 81.1   Hematocrit 91.4 - 46.0 % 39.4   Platelets 150.0 - 400.0 K/uL 288.0     Assessment and plan- Patient is a 52 y.o. female with right breast DCIS on tamoxifen  here for routine follow-up  Clinically patient is doing well with no concerning signs and symptoms of recurrence based on today's exam.  She is tolerating tamoxifen well.  I will check her inhibin B levels, estradiol and FSH levels at this time.  If the levels are menopausal I will transition her from tamoxifen to aromatase inhibitors since there is more favorable data for AI as compared to tamoxifen as per NSABP 35 trial in women less than 70 years of age.  Which showed improved breast cancer free survival at 10 years 93% versus 89% and lower incidence of subsequent breast cancer events.  I will call the patient with the results of her hormone level testing  I will see her back in 6 months no labs.  Recent mammogram from February 2024 was unremarkable.   Visit Diagnosis 1. Encounter for follow-up surveillance of ductal carcinoma in situ (DCIS) of breast   2. Encounter for monitoring tamoxifen therapy      Dr. Owens Shark, MD, MPH [facility] [phone] 10/24/2022 2:08 PM

## 2022-10-24 NOTE — Progress Notes (Signed)
No concerns for the provider. 

## 2022-10-24 NOTE — Addendum Note (Signed)
Addended by: Corene Cornea on: 10/24/2022 02:37 PM   Modules accepted: Orders

## 2022-10-25 LAB — FSH/LH
FSH: 11.4 m[IU]/mL
LH: 8.1 m[IU]/mL

## 2022-10-25 LAB — ESTRADIOL: Estradiol: 24.9 pg/mL

## 2022-10-27 LAB — INHIBIN B: Inhibin B: <7 pg/mL

## 2022-10-28 ENCOUNTER — Telehealth: Payer: Self-pay | Admitting: *Deleted

## 2022-10-28 ENCOUNTER — Other Ambulatory Visit: Payer: Self-pay | Admitting: *Deleted

## 2022-10-28 MED ORDER — LETROZOLE 2.5 MG PO TABS: 2.5000 mg | ORAL_TABLET | Freq: Every day | ORAL | 3 refills | Status: DC

## 2022-10-28 NOTE — Telephone Encounter (Signed)
Pt states that since the post menopause based on the labs that dr Smith Robert wanted pt to stop the tamoxifen and start letrozole. Patient is agreeable and I sent the letrozole to her pharmacy and the pt will start the new med once she gets it  from pharmacy and she will stop the tamoxifen.

## 2022-10-28 NOTE — Telephone Encounter (Signed)
-----   Message from Creig Hines, MD sent at 10/28/2022  9:07 AM EDT ----- Hormone levels are post menopausal. We can switch her from tamoxifen to letrozole. Can you ask her if she is willing to try it?

## 2022-12-31 DIAGNOSIS — M545 Low back pain, unspecified: Secondary | ICD-10-CM | POA: Diagnosis not present

## 2022-12-31 DIAGNOSIS — W19XXXA Unspecified fall, initial encounter: Secondary | ICD-10-CM | POA: Diagnosis not present

## 2022-12-31 DIAGNOSIS — Y92511 Restaurant or cafe as the place of occurrence of the external cause: Secondary | ICD-10-CM | POA: Diagnosis not present

## 2022-12-31 DIAGNOSIS — S3992XA Unspecified injury of lower back, initial encounter: Secondary | ICD-10-CM | POA: Diagnosis not present

## 2022-12-31 DIAGNOSIS — W08XXXA Fall from other furniture, initial encounter: Secondary | ICD-10-CM | POA: Diagnosis not present

## 2023-02-02 DIAGNOSIS — M1712 Unilateral primary osteoarthritis, left knee: Secondary | ICD-10-CM | POA: Diagnosis not present

## 2023-02-02 DIAGNOSIS — M25562 Pain in left knee: Secondary | ICD-10-CM | POA: Diagnosis not present

## 2023-02-18 ENCOUNTER — Other Ambulatory Visit: Payer: Self-pay | Admitting: Oncology

## 2023-05-01 ENCOUNTER — Inpatient Hospital Stay: Payer: BC Managed Care – PPO | Admitting: Oncology

## 2023-05-15 ENCOUNTER — Inpatient Hospital Stay: Payer: BC Managed Care – PPO | Attending: Oncology | Admitting: Oncology

## 2023-05-15 ENCOUNTER — Encounter: Payer: Self-pay | Admitting: Oncology

## 2023-05-15 VITALS — BP 137/79 | HR 90 | Temp 97.6°F | Resp 20 | Wt 278.4 lb

## 2023-05-15 DIAGNOSIS — Z803 Family history of malignant neoplasm of breast: Secondary | ICD-10-CM | POA: Insufficient documentation

## 2023-05-15 DIAGNOSIS — Z86000 Personal history of in-situ neoplasm of breast: Secondary | ICD-10-CM

## 2023-05-15 DIAGNOSIS — Z923 Personal history of irradiation: Secondary | ICD-10-CM | POA: Insufficient documentation

## 2023-05-15 DIAGNOSIS — D0511 Intraductal carcinoma in situ of right breast: Secondary | ICD-10-CM | POA: Insufficient documentation

## 2023-05-15 DIAGNOSIS — Z79811 Long term (current) use of aromatase inhibitors: Secondary | ICD-10-CM | POA: Insufficient documentation

## 2023-05-15 DIAGNOSIS — Z08 Encounter for follow-up examination after completed treatment for malignant neoplasm: Secondary | ICD-10-CM | POA: Diagnosis not present

## 2023-05-16 NOTE — Progress Notes (Signed)
Hematology/Oncology Consult note [facility]  [phone] [phone]  Patient Care Team: Allegra Grana, FNP as PCP - General (Family Medicine) Creig Hines, MD as Consulting Physician (Oncology) Campbell Lerner, MD as Consulting Physician (General Surgery) Carmina Miller, MD as Consulting Physician (Radiation Oncology) Hildred Laser, MD as Consulting Physician (Obstetrics and Gynecology)   Name of the patient: Lorraine Holmes  [id number]  [birth year]   Date of visit: 05/16/23  Diagnosis- right breast DCIS ER positive    Chief complaint/ Reason for visit-routine follow-up of right breast DCIS  Heme/Onc history:  Patient is a 52 -year-old female who was found to have calcifications in the right breast on a screening mammogram that was followed by diagnostic mammogram and ultrasound.  Showed 0.8 cm of indeterminate calcification which was biopsied and consistent with high-grade DCIS comedo type.  She underwent Lumpectomy which showed high-grade DCIS which was negative for any invasive carcinoma.  Margins negative with 2 mm ER positive pTis.   Patient has met with radiation oncology and will be going for adjuvant radiation treatment as well.  She has a Mirena in place for the last 7 years and has not had any menstrual cycles on Mirena.  Mirena taken out in June 2022 and no menstrual cycle since then.  Patient started taking tamoxifen in July 2022.  She was switched to letrozole in April 2024  Interval history-tolerating letrozole well without any significant side effects.  Denies any breast concerns today.  ECOG PS- 0 Pain scale- 0   Review of systems- Review of Systems  Constitutional:  Negative for chills, fever, malaise/fatigue and weight loss.  HENT:  Negative for congestion, ear discharge and nosebleeds.   Eyes:  Negative for blurred vision.  Respiratory:  Negative for cough, hemoptysis, sputum production, shortness of breath and  wheezing.   Cardiovascular:  Negative for chest pain, palpitations, orthopnea and claudication.  Gastrointestinal:  Negative for abdominal pain, blood in stool, constipation, diarrhea, heartburn, melena, nausea and vomiting.  Genitourinary:  Negative for dysuria, flank pain, frequency, hematuria and urgency.  Musculoskeletal:  Negative for back pain, joint pain and myalgias.  Skin:  Negative for rash.  Neurological:  Negative for dizziness, tingling, focal weakness, seizures, weakness and headaches.  Endo/Heme/Allergies:  Does not bruise/bleed easily.  Psychiatric/Behavioral:  Negative for depression and suicidal ideas. The patient does not have insomnia.       Allergies  Allergen Reactions   No [employer] Food Allergies Hives    Many food allergies.  Takes zyrtec for hives everyday     Past Medical History:  Diagnosis Date   Anemia    Arthritis    Bilateral knee   Breast cancer (HCC)    Ductal carcinoma in situ (DCIS) of right breast   Breast cancer (HCC)    Ductal carcinoma in situ (DCIS) of right breast 10/05/2020   History of blood transfusion    History of chicken pox    Hypertension    Obesity    bmi 44   Personal history of radiation therapy      Past Surgical History:  Procedure Laterality Date   BREAST BIOPSY Right 09/27/2020   stereo bx, ribbon clip, positive   BREAST LUMPECTOMY Right 10/24/2020   lumpectomy High grade DCIS   BREAST LUMPECTOMY WITH RADIOFREQUENCY TAG IDENTIFICATION Right 10/24/2020   Procedure: BREAST LUMPECTOMY WITH RADIOFREQUENCY TAG IDENTIFICATION;  Surgeon: Campbell Lerner, MD;  Location: [facility] ORS;  Service: General;  Laterality: Right;  COLONOSCOPY WITH PROPOFOL N/A 07/18/2019   Procedure: COLONOSCOPY WITH PROPOFOL;  Surgeon: Toney Reil, MD;  Location: [facility] ENDOSCOPY;  Service: Gastroenterology;  Laterality: N/A;   FOOT FRACTURE SURGERY Left    2002.  metal in toe   JOINT REPLACEMENT     KNEE ARTHROSCOPY WITH MEDIAL  MENISECTOMY Left 11/28/2014   Procedure: KNEE ARTHROSCOPY WITH MEDIAL MENISECTOMY;  Surgeon: Kennedy Bucker, MD;  Location: [facility] ORS;  Service: Orthopedics;  Laterality: Left;   MEDIAL PARTIAL KNEE REPLACEMENT Right 2013   meniscus tear Right 2012    Social History   Socioeconomic History   Marital status: Single    Spouse name: Not on file   Number of children: 2   Years of education: 16   Highest education level: Not on file  Occupational History   Occupation: Agricultural engineer: [employer]    Comment: [employer]  Tobacco Use   Smoking status: Never    Passive exposure: Past   Smokeless tobacco: Never  Vaping Use   Vaping status: Never Used  Substance and Sexual Activity   Alcohol use: Yes    Alcohol/week: 2.0 standard drinks of alcohol    Types: 2 Standard drinks or equivalent per week    Comment: occ   Drug use: No   Sexual activity: Yes    Comment: mirena  Other Topics Concern   Not on file  Social History Narrative   Siddalee grew up in [place]. She lives at home with her children and her mother. 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       Working [employer] in [place].       Works from home      Social Determinants of Corporate investment banker Strain: Not on [employer] Insecurity: Not on file  Transportation Needs: Not on file  Physical Activity: Not on file  Stress: Not on file  Social Connections: Not on file  Intimate Partner Violence: Not on file    Family History  Problem Relation Age of Onset   Hypertension Mother    Diabetes Mother    Hypertension Father    Heart disease Father 40       CAD - died MI   Diabetes Maternal Grandmother    Breast cancer Maternal Aunt        great aunt   Colon cancer Neg Hx    Thyroid cancer Neg Hx      Current Outpatient Medications:    amLODipine (NORVASC) 10 MG tablet, Take 1 tablet (10 mg total) by mouth daily., Disp: 90  tablet, Rfl: 3   cetirizine (ZYRTEC) 10 MG tablet, Take 10 mg by mouth daily., Disp: , Rfl:    cholecalciferol (VITAMIN D3) 25 MCG (1000 UNIT) tablet, Take 1,000 Units by mouth daily., Disp: , Rfl:    letrozole (FEMARA) 2.5 MG tablet, TAKE 1 TABLET BY MOUTH EVERY DAY, Disp: 90 tablet, Rfl: 1   tamoxifen (NOLVADEX) 20 MG tablet, Take 1 tablet (20 mg total) by mouth daily. (Patient not taking: Reported on 05/15/2023), Disp: 90 tablet, Rfl: 2  Physical exam:  Vitals:   05/15/23 1457  BP: 137/79  Pulse: 90  Resp: 20  Temp: 97.6 F (36.4 C)  SpO2: 100%  Weight: 278 lb 6.4 oz (126.3 kg)   Physical Exam Cardiovascular:     Rate and Rhythm: Normal rate and regular rhythm.     Heart sounds:  Normal heart sounds.  Pulmonary:     Effort: Pulmonary effort is normal.     Breath sounds: Normal breath sounds.  Skin:    General: Skin is warm and dry.  Neurological:     Mental Status: She is alert and oriented to person, place, and time.     Breast exam was performed in seated and lying down position. Patient is status post right lumpectomy with a well-healed surgical scar. No evidence of any palpable masses. No evidence of axillary adenopathy. No evidence of any palpable masses or lumps in the left breast. No evidence of leftt axillary adenopathy      Latest Ref Rng & Units 07/08/2022    9:43 AM  CMP  Glucose 70 - 99 mg/dL 95   BUN 6 - 23 mg/dL 11   Creatinine 2.72 - 1.20 mg/dL 5.36   Sodium 644 - 034 mEq/L 142   Potassium 3.5 - 5.1 mEq/L 4.4   Chloride 96 - 112 mEq/L 106   CO2 19 - 32 mEq/L 26   Calcium 8.4 - 10.5 mg/dL 9.7   Total Protein 6.0 - 8.3 g/dL 6.9   Total Bilirubin 0.2 - 1.2 mg/dL 0.4   Alkaline Phos 39 - 117 U/L 62   AST 0 - 37 U/L 14   ALT 0 - 35 U/L 8       Latest Ref Rng & Units 07/08/2022    9:43 AM  CBC  WBC 4.0 - 10.5 K/uL 8.9   Hemoglobin 12.0 - 15.0 g/dL 74.2   Hematocrit 59.5 - 46.0 % 39.4   Platelets 150.0 - 400.0 K/uL 288.0     No images are attached to  the encounter.  No results found.   Assessment and plan- Patient is a 52 y.o. female with history of right breast DCIS ER positive currently on letrozole here for routine follow-up  Patient is tolerating letrozole well without any significant side effects.  She had her Mirena taken out at the time of diagnosis of her DCIS back in June 2022 when she has not had any return of menstrual cycles.  She will continue to take endocrine therapy for 5 years ending roughly in June 2027.  I will see her back in 6 months with bone density scan prior.  I have encouraged her to take calcium 1200 mg along with vitamin D 800 international units.   Visit Diagnosis 1. Encounter for follow-up surveillance of ductal carcinoma in situ (DCIS) of breast   2. Use of letrozole (Femara)      Dr. Owens Shark, MD, MPH [facility] at [facility] [phone] 05/16/2023 3:22 PM

## 2023-07-13 ENCOUNTER — Encounter: Payer: BC Managed Care – PPO | Admitting: Family

## 2023-07-13 ENCOUNTER — Telehealth: Payer: Self-pay

## 2023-07-13 NOTE — Telephone Encounter (Signed)
 Left a vm saying give the office a call to reschedule appointment due to weather.

## 2023-07-13 NOTE — Telephone Encounter (Signed)
 LVM to call back to reschedule appt due to office opening later

## 2023-07-24 ENCOUNTER — Other Ambulatory Visit: Payer: Self-pay | Admitting: Family

## 2023-07-24 DIAGNOSIS — R030 Elevated blood-pressure reading, without diagnosis of hypertension: Secondary | ICD-10-CM

## 2023-07-24 DIAGNOSIS — Z0000 Encounter for general adult medical examination without abnormal findings: Secondary | ICD-10-CM

## 2023-08-30 ENCOUNTER — Other Ambulatory Visit: Payer: Self-pay | Admitting: Oncology

## 2023-08-31 ENCOUNTER — Other Ambulatory Visit: Payer: Self-pay

## 2023-08-31 MED ORDER — LETROZOLE 2.5 MG PO TABS: 2.5000 mg | ORAL_TABLET | Freq: Every day | ORAL | 1 refills | Status: DC

## 2023-09-10 ENCOUNTER — Ambulatory Visit
Admission: RE | Admit: 2023-09-10 | Discharge: 2023-09-10 | Disposition: A | Discharge status: Home / Self Care | Payer: BC Managed Care – PPO | Source: Ambulatory Visit | Attending: Oncology | Admitting: Oncology

## 2023-09-10 DIAGNOSIS — Z86000 Personal history of in-situ neoplasm of breast: Secondary | ICD-10-CM | POA: Diagnosis not present

## 2023-09-10 DIAGNOSIS — R92333 Mammographic heterogeneous density, bilateral breasts: Secondary | ICD-10-CM | POA: Diagnosis not present

## 2023-09-10 DIAGNOSIS — Z08 Encounter for follow-up examination after completed treatment for malignant neoplasm: Secondary | ICD-10-CM

## 2023-09-10 DIAGNOSIS — Z0389 Encounter for observation for other suspected diseases and conditions ruled out: Secondary | ICD-10-CM | POA: Diagnosis not present

## 2023-09-10 DIAGNOSIS — R928 Other abnormal and inconclusive findings on diagnostic imaging of breast: Secondary | ICD-10-CM | POA: Diagnosis not present

## 2023-09-17 ENCOUNTER — Ambulatory Visit (INDEPENDENT_AMBULATORY_CARE_PROVIDER_SITE_OTHER): Payer: BC Managed Care – PPO | Admitting: Surgery

## 2023-09-17 ENCOUNTER — Encounter: Payer: Self-pay | Admitting: Surgery

## 2023-09-17 VITALS — BP 155/94 | HR 83 | Temp 98.1°F | Ht 66.0 in | Wt 280.8 lb

## 2023-09-17 DIAGNOSIS — D0511 Intraductal carcinoma in situ of right breast: Secondary | ICD-10-CM | POA: Diagnosis not present
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
## 2023-09-17 NOTE — Patient Instructions (Signed)
 Breast Self-Awareness Breast self-awareness is knowing how your breasts look and feel. You need to: Check your breasts on a regular basis. Tell your doctor about any changes. Become familiar with the look and feel of your breasts. This can help you catch a breast problem while it is still small and can be treated. You should do breast self-exams even if you have breast implants. What you need: A mirror. A well-lit room. A pillow or other soft object. How to do a breast self-exam Follow these steps to do a breast self-exam: Look for changes  Take off all the clothes above your waist. Stand in front of a mirror in a room with good lighting. Put your hands down at your sides. Compare your breasts in the mirror. Look for any difference between them, such as: A difference in shape. A difference in size. Wrinkles, dips, and bumps in one breast and not the other. Look at each breast for changes in the skin, such as: Redness. Scaly areas. Skin that has gotten thicker. Dimpling. Open sores (ulcers). Look for changes in your nipples, such as: Fluid coming out of a nipple. Fluid around a nipple. Bleeding. Dimpling. Redness. A nipple that looks pushed in (retracted), or that has changed position. Feel for changes Lie on your back. Feel each breast. To do this: Pick a breast to feel. Place a pillow under the shoulder closest to that breast. Put the arm closest to that breast behind your head. Feel the nipple area of that breast using the hand of your other arm. Feel the area with the pads of your three middle fingers by making small circles with your fingers. Use light, medium, and firm pressure. Continue the overlapping circles, moving downward over the breast. Keep making circles with your fingers. Stop when you feel your ribs. Start making circles with your fingers again, this time going upward until you reach your collarbone. Then, make circles outward across your breast and into your  armpit area. Squeeze your nipple. Check for discharge and lumps. Repeat these steps to check your other breast. Sit or stand in the tub or shower. With soapy water on your skin, feel each breast the same way you did when you were lying down. Write down what you find Writing down what you find can help you remember what to tell your doctor. Write down: What is normal for each breast. Any changes you find in each breast. These include: The kind of changes you find. A tender or painful breast. Any lump you find. Write down its size and where it is. When you last had your monthly period (menstrual cycle). General tips If you are breastfeeding, the best time to check your breasts is after you feed your baby or after you use a breast pump. If you get monthly bleeding, the best time to check your breasts is 5-7 days after your monthly cycle ends. With time, you will become comfortable with the self-exam. You will also start to know if there are changes in your breasts. Contact a doctor if: You see a change in the shape or size of your breasts or nipples. You see a change in the skin of your breast or nipples, such as red or scaly skin. You have fluid coming from your nipples that is not normal. You find a new lump or thick area. You have breast pain. You have any concerns about your breast health. Summary Breast self-awareness includes looking for changes in your breasts and feeling for changes  within your breasts. You should do breast self-awareness in front of a mirror in a well-lit room. If you get monthly periods (menstrual cycles), the best time to check your breasts is 5-7 days after your period ends. Tell your doctor about any changes you see in your breasts. Changes include changes in size, changes on the skin, painful or tender breasts, or fluid from your nipples that is not normal. This information is not intended to replace advice given to you by your health care provider. Make sure  you discuss any questions you have with your health care provider. Document Revised: 11/28/2021 Document Reviewed: 04/25/2021 Elsevier Patient Education  2024 [employer].

## 2023-09-24 ENCOUNTER — Ambulatory Visit: Payer: BC Managed Care – PPO | Admitting: Family

## 2023-09-24 ENCOUNTER — Encounter: Payer: Self-pay | Admitting: Family

## 2023-09-24 VITALS — BP 120/80 | HR 78 | Temp 97.8°F | Ht 66.0 in | Wt 284.3 lb

## 2023-09-24 DIAGNOSIS — Z0000 Encounter for general adult medical examination without abnormal findings: Secondary | ICD-10-CM | POA: Diagnosis not present

## 2023-09-24 DIAGNOSIS — I10 Essential (primary) hypertension: Secondary | ICD-10-CM | POA: Diagnosis not present

## 2023-09-24 DIAGNOSIS — E669 Obesity, unspecified: Secondary | ICD-10-CM | POA: Diagnosis not present

## 2023-09-24 DIAGNOSIS — Z8249 Family history of ischemic heart disease and other diseases of the circulatory system: Secondary | ICD-10-CM

## 2023-09-24 LAB — CBC WITH DIFFERENTIAL/PLATELET
Basophils Absolute: 0.1 10*3/uL (ref 0.0–0.1)
Basophils Relative: 0.8 % (ref 0.0–3.0)
Eosinophils Absolute: 0.2 10*3/uL (ref 0.0–0.7)
Eosinophils Relative: 2.9 % (ref 0.0–5.0)
HCT: 39.1 % (ref 36.0–46.0)
Hemoglobin: 12.7 g/dL (ref 12.0–15.0)
Lymphocytes Relative: 29.4 % (ref 12.0–46.0)
Lymphs Abs: 2.2 10*3/uL (ref 0.7–4.0)
MCHC: 32.4 g/dL (ref 30.0–36.0)
MCV: 87.8 fl (ref 78.0–100.0)
Monocytes Absolute: 0.6 10*3/uL (ref 0.1–1.0)
Monocytes Relative: 8.5 % (ref 3.0–12.0)
Neutro Abs: 4.3 10*3/uL (ref 1.4–7.7)
Neutrophils Relative %: 58.4 % (ref 43.0–77.0)
Platelets: 298 10*3/uL (ref 150.0–400.0)
RBC: 4.45 Mil/uL (ref 3.87–5.11)
RDW: 14.7 % (ref 11.5–15.5)
WBC: 7.4 10*3/uL (ref 4.0–10.5)

## 2023-09-24 LAB — LIPID PANEL
Cholesterol: 176 mg/dL (ref 0–200)
HDL: 60.6 mg/dL (ref >39.00)
LDL Cholesterol: 105 mg/dL — ABNORMAL HIGH (ref 0–99)
NonHDL: 115.45
Total CHOL/HDL Ratio: 3
Triglycerides: 52 mg/dL (ref 0.0–149.0)
VLDL: 10.4 mg/dL (ref 0.0–40.0)

## 2023-09-24 LAB — COMPREHENSIVE METABOLIC PANEL
ALT: 20 U/L (ref 0–35)
AST: 21 U/L (ref 0–37)
Albumin: 4.3 g/dL (ref 3.5–5.2)
Alkaline Phosphatase: 91 U/L (ref 39–117)
BUN: 13 mg/dL (ref 6–23)
CO2: 28 meq/L (ref 19–32)
Calcium: 10.3 mg/dL (ref 8.4–10.5)
Chloride: 107 meq/L (ref 96–112)
Creatinine, Ser: 0.74 mg/dL (ref 0.40–1.20)
GFR: 91.46 mL/min (ref >60.00)
Glucose, Bld: 104 mg/dL — ABNORMAL HIGH (ref 70–99)
Potassium: 4.7 meq/L (ref 3.5–5.1)
Sodium: 144 meq/L (ref 135–145)
Total Bilirubin: 0.5 mg/dL (ref 0.2–1.2)
Total Protein: 7.3 g/dL (ref 6.0–8.3)

## 2023-09-24 LAB — TSH: TSH: 0.74 u[IU]/mL (ref 0.35–5.50)

## 2023-09-24 LAB — HEMOGLOBIN A1C: Hgb A1c MFr Bld: 5.8 % (ref 4.6–6.5)

## 2023-09-24 LAB — VITAMIN D 25 HYDROXY (VIT D DEFICIENCY, FRACTURES): VITD: 22.66 ng/mL — ABNORMAL LOW (ref 30.00–100.00)

## 2023-09-24 NOTE — Assessment & Plan Note (Signed)
 Deferred pelvic exam in the absence of complaints and Pap smear is up-to-date.  She plans to follow with Dr. Valentino Saxon.  Politely declines clinical breast exam as she has recently seen general surgery.  Encouraged exercise

## 2023-09-24 NOTE — Assessment & Plan Note (Signed)
 Encouraged consideration for further restratification including CT calcium score d/t family history.  She will consider this test and let me know.

## 2023-09-24 NOTE — Assessment & Plan Note (Signed)
 Insurance did not cover Zepbound.  Discussed Wellbutrin however she has upcoming celebratory trip in May 2025.  We discussed alcohol and lowering of seizure threshold on Wellbutrin.  We opted to consider Wellbutrin eventually after her trip.  Discussed metformin, she politely declines at this time.  Discussed grading for deficit and provided low glycemic diet

## 2023-09-24 NOTE — Patient Instructions (Addendum)
 We will consider trial of wellbutrin 150mg  in the morning when you are back from your trip to [place]   If tolerating, after 3-4 weeks, may increase to wellbutrin 300mg  in the morning total.   Please download Myfitness Pal App ( basic version is free).   You may log every thing you eat for even 2-3 days to get a better of idea of total daily calories. To loose weight, we have to create caloric deficit to loose weight. The goal is 1-2 lbs per week of weight loss.   Excellent article below from [facility].   https://www.health.CriticalZ.it  Calorie counting made easy  Eat less, exercise more. If only it were that simple! As most dieters know, losing weight can be very challenging. As this report details, a range of influences can affect how people gain and lose weight. But a basic understanding of how to tip your energy balance in favor of weight loss is a good place to start.  Start by determining how many calories you should consume each day. To do so, you need to know how many calories you need to maintain your current weight. Doing this requires a few simple calculations.  First, multiply your current weight by 15 -- that's roughly the number of calories per pound of body weight needed to maintain your current weight if you are moderately active. Moderately active means getting at least 30 minutes of physical activity a day in the form of exercise (walking at a brisk pace, climbing stairs, or active gardening). Let's say you're a woman who is 5 feet, 4 inches tall and weighs 155 pounds, and you need to lose about 15 pounds to put you in a healthy weight range. If you multiply 155 by 15, you will get 2,325, which is the number of calories per day that you need in order to maintain your current weight (weight-maintenance calories). To lose weight, you will need to get below that total.  For example, to lose 1 to 2 pounds a week -- a rate that  experts consider safe -- your food consumption should provide 500 to 1,000 calories less than your total weight-maintenance calories. If you need 2,325 calories a day to maintain your current weight, reduce your daily calories to between 1,325 and 1,825. If you are sedentary, you will also need to build more activity into your day. In order to lose at least a pound a week, try to do at least 30 minutes of physical activity on most days, and reduce your daily calorie intake by at least 500 calories. However, calorie intake should not fall below 1,200 a day in women or 1,500 a day in men, except under the supervision of a health professional. Eating too few calories can endanger your health by depriving you of needed nutrients.  Meeting your calorie target How can you meet your daily calorie target? One approach is to add up the number of calories per serving of all the foods that you eat, and then plan your menus accordingly. You can buy books that list calories per serving for many foods. In addition, the nutrition labels on all packaged foods and beverages provide calories per serving information. Make a point of reading the labels of the foods and drinks you use, noting the number of calories and the serving sizes. Many recipes published in cookbooks, newspapers, and magazines provide similar information.  If you hate counting calories, a different approach is to restrict how much and how often you eat,  and to eat meals that are low in calories. Dietary guidelines issued by the American Heart Association stress common sense in choosing your foods rather than focusing strictly on numbers, such as total calories or calories from fat. Whichever method you choose, research shows that a regular eating schedule -- with meals and snacks planned for certain times each day -- makes for the most successful approach. The same applies after you have lost weight and want to keep it off. Sticking with an eating schedule  increases your chance of maintaining your new weight.    This is  Dr. Melina Schools  ( an amazing physician in my office!)  example of a  "Low GI"  Diet:  It will allow you to lose 4 to 8  lbs  per month if you follow it carefully.  Your goal with exercise is a minimum of 30 minutes of aerobic exercise 5 days per week (Walking does not count once it becomes easy!)    All of the foods can be found at grocery stores and in bulk at [employer].  The Atkins protein bars and shakes are available in more varieties at Target, [employer] and [employer]'s Foods.     7 AM Breakfast:  Choose from the following:  Low carbohydrate Protein  Shakes (I recommend the  Premier Protein chocolate shakes,  EAS AdvantEdge "Carb Control" shakes  Or the Atkins shakes all are under 3 net carbs)     a scrambled egg/bacon/cheese burrito made with Mission's "carb balance" whole wheat tortilla  (about 10 net carbs )  Medical laboratory scientific officer (basically a quiche without the pastry crust) that is eaten cold and very convenient way to get your eggs.  8 carbs)  If you make your own protein shakes, avoid bananas and pineapple,  And use low carb greek yogurt or original /unsweetened almond or soy milk    Avoid cereal and bananas, oatmeal and cream of wheat and grits. They are loaded with carbohydrates!   10 AM: high protein snack:  Protein bar by Atkins (the snack size, under 200 cal, usually < 6 net carbs).    A stick of cheese:  Around 1 carb,  100 cal     Dannon Light n Fit [place] Yogurt  (80 cal, 8 carbs)  Other so called "protein bars" and Greek yogurts tend to be loaded with carbohydrates.  Remember, in food advertising, the word "energy" is synonymous for " carbohydrate."  Lunch:   A Sandwich using the bread choices listed, Can use any  Eggs,  lunchmeat, grilled meat or canned tuna), avocado, regular mayo/mustard  and cheese.  A Salad using blue cheese, ranch,  Goddess or vinagrette,  Avoid taco shells, croutons or  "confetti" and no "candied nuts" but regular nuts OK.   No pretzels, nabs  or chips.  Pickles and miniature sweet peppers are a good low carb alternative that provide a "crunch"  The bread is the only source of carbohydrate in a sandwich and  can be decreased by trying some of the attached alternatives to traditional loaf bread   Avoid "Low fat dressings, as well as Reyne Dumas and [employer] dressings They are loaded with sugar!   3 PM/ Mid day  Snack:  Consider  1 ounce of  almonds, walnuts, pistachios, pecans, peanuts,  Macadamia nuts or a nut medley.  Avoid "granola and granola bars "  Mixed nuts are ok in moderation as long as there are no raisins,  cranberries or  dried fruit.   KIND bars are OK if you get the low glycemic index variety   Try the prosciutto/mozzarella cheese sticks by Fiorruci  In deli /backery section   High protein      6 PM  Dinner:     Meat/fowl/fish with a green salad, and either broccoli, cauliflower, green beans, spinach, brussel sprouts or  Lima beans. DO NOT BREAD THE PROTEIN!!      There is a low carb pasta by Dreamfield's that is acceptable and tastes great: only 5 digestible carbs/serving.( All grocery stores but BJs carry it ) Several ready made meals are available low carb:   Try Michel Angelo's chicken piccata or chicken or eggplant parm over low carb pasta.(Lowes and BJs)   Clifton Custard Sanchez's "Carnitas" (pulled pork, no sauce,  0 carbs) or his beef pot roast to make a dinner burrito (at [employer]J's)  Pesto over low carb pasta (bj's sells a good quality pesto in the center refrigerated section of the deli   Try satueeing  Roosvelt Harps with mushroooms as a good side   [employer] Giant makes a mashed cauliflower that tastes like mashed potatoes  Whole wheat pasta is still full of digestible carbs and  Not as low in glycemic index as Dreamfield's.   Brown rice is still rice,  So skip the rice and noodles if you eat [place] or [place] (or at least limit to 1/2 cup)  9 PM  snack :   Breyer's "low carb" fudgsicle or  ice cream bar (Carb Smart line), or  Weight Watcher's ice cream bar , or another "no sugar added" ice cream;  a serving of fresh berries/cherries with whipped cream   Cheese or DANNON'S LlGHT N FIT GREEK YOGURT  8 ounces of [place] unsweetened almond/cococunut milk    Treat yourself to a parfait made with whipped cream blueberiies, walnuts and vanilla greek yogurt  Avoid bananas, pineapple, grapes  and watermelon on a regular basis because they are high in sugar.  THINK OF THEM AS DESSERT  Remember that snack Substitutions should be less than 10 NET carbs per serving and meals < 20 carbs. Remember to subtract fiber grams to get the "net carbs."  We discussed CT calcium score. Please let me know if you would like to pursue.   An estimate of cost is $150-200 out-of-pocket as not covered by insurance. However please call your insurance company in advance to ensure no other costs so that you do not have any unexpected bills.      Below an article from [facility] regarding the test.   [email or web address].org/imaging/exams-and-procedures/screenings/cardiac-ct#:~:text=A%20cardiac%20CT%20calcium%20score,arteries%20can%20cause%20heart%20attacks.   Exams We Offer: Cardiac CT Calcium Score  Knowing your score could save your life. A cardiac CT calcium score, also known as a coronary calcium scan, is a quick, convenient and noninvasive way of evaluating the amount of calcified (hard) plaque in your heart vessels. The level of calcium equates to the extent of plaque build-up in your arteries. Plaque in the arteries can cause heart attacks.  The radiologist reads the images and sends your doctor a report with a calcium score. Patients with higher scores have a greater risk for a heart attack, heart disease or stroke. Knowing your score can help your doctor decide on blood pressure and cholesterol goals that will minimize your risk as  much as possible.  The [employer] of Cardiology found that Coronary artery calcification (CAC) is an excellent cardiovascular disease risk marker and can help guide the decision to  use cholesterol reducing medications such as statins. A negative calcium score may reduce the need for statins in otherwise eligible patients.  The exam takes less than 10 minutes, is painless and does not require any IV or oral contrast. At [facility] locations, the out-of-pocket fee without insurance is $75. At the time of scheduling, please let us know if you want to process the exam through your insurance or self-pay at the rate of $75. Patients who want to self-pay should not submit their insurance card when checking in for the appointment.   Who should get a Cardiac CT Calcium Score: Middle age adults at intermediate risk of heart disease Family history of heart disease Borderline high cholesterol, high blood pressure or diabetes Overweight or physical inactivity Uncertain about taking daily preventive medical therapy  Health Maintenance for Postmenopausal Women Menopause is a normal process in which your ability to get pregnant comes to an end. This process happens slowly over many months or years, usually between the ages of 54 and 109. Menopause is complete when you have missed your menstrual period for 12 months. It is important to talk with your health care provider about some of the most common conditions that affect women after menopause (postmenopausal women). These include heart disease, cancer, and bone loss (osteoporosis). Adopting a healthy lifestyle and getting preventive care can help to promote your health and wellness. The actions you take can also lower your chances of developing some of these common conditions. What are the signs and symptoms of menopause? During menopause, you may have the following symptoms: Hot flashes. These can be moderate or severe. Night  sweats. Decrease in sex drive. Mood swings. Headaches. Tiredness (fatigue). Irritability. Memory problems. Problems falling asleep or staying asleep. Talk with your health care provider about treatment options for your symptoms. Do I need hormone replacement therapy? Hormone replacement therapy is effective in treating symptoms that are caused by menopause, such as hot flashes and night sweats. Hormone replacement carries certain risks, especially as you become older. If you are thinking about using estrogen or estrogen with progestin, discuss the benefits and risks with your health care provider. How can I reduce my risk for heart disease and stroke? The risk of heart disease, heart attack, and stroke increases as you age. One of the causes may be a change in the body's hormones during menopause. This can affect how your body uses dietary fats, triglycerides, and cholesterol. Heart attack and stroke are medical emergencies. There are many things that you can do to help prevent heart disease and stroke. Watch your blood pressure High blood pressure causes heart disease and increases the risk of stroke. This is more likely to develop in people who have high blood pressure readings or are overweight. Have your blood pressure checked: Every 3-5 years if you are 32-22 years of age. Every year if you are 64 years old or older. Eat a healthy diet  Eat a diet that includes plenty of vegetables, fruits, low-fat dairy products, and lean protein. Do not eat a lot of foods that are high in solid fats, added sugars, or sodium. Get regular exercise Get regular exercise. This is one of the most important things you can do for your health. Most adults should: Try to exercise for at least 150 minutes each week. The exercise should increase your heart rate and make you sweat (moderate-intensity exercise). Try to do strengthening exercises at least twice each week. Do these in addition to the  moderate-intensity exercise. Spend less time sitting. Even light physical activity can be beneficial. Other tips Work with your health care provider to achieve or maintain a healthy weight. Do not use any products that contain nicotine or tobacco. These products include cigarettes, chewing tobacco, and vaping devices, such as e-cigarettes. If you need help quitting, ask your health care provider. Know your numbers. Ask your health care provider to check your cholesterol and your blood sugar (glucose). Continue to have your blood tested as directed by your health care provider. Do I need screening for cancer? Depending on your health history and family history, you may need to have cancer screenings at different stages of your life. This may include screening for: Breast cancer. Cervical cancer. Lung cancer. Colorectal cancer. What is my risk for osteoporosis? After menopause, you may be at increased risk for osteoporosis. Osteoporosis is a condition in which bone destruction happens more quickly than new bone creation. To help prevent osteoporosis or the bone fractures that can happen because of osteoporosis, you may take the following actions: If you are 31-36 years old, get at least 1,000 mg of calcium and at least 600 international units (IU) of vitamin D per day. If you are older than age 8 but younger than age 49, get at least 1,200 mg of calcium and at least 600 international units (IU) of vitamin D per day. If you are older than age 63, get at least 1,200 mg of calcium and at least 800 international units (IU) of vitamin D per day. Smoking and drinking excessive alcohol increase the risk of osteoporosis. Eat foods that are rich in calcium and vitamin D, and do weight-bearing exercises several times each week as directed by your health care provider. How does menopause affect my mental health? Depression may occur at any age, but it is more common as you become older. Common symptoms of  depression include: Feeling depressed. Changes in sleep patterns. Changes in appetite or eating patterns. Feeling an overall lack of motivation or enjoyment of activities that you previously enjoyed. Frequent crying spells. Talk with your health care provider if you think that you are experiencing any of these symptoms. General instructions See your health care provider for regular wellness exams and vaccines. This may include: Scheduling regular health, dental, and eye exams. Getting and maintaining your vaccines. These include: Influenza vaccine. Get this vaccine each year before the flu season begins. Pneumonia vaccine. Shingles vaccine. Tetanus, diphtheria, and pertussis (Tdap) booster vaccine. Your health care provider may also recommend other immunizations. Tell your health care provider if you have ever been abused or do not feel safe at home. Summary Menopause is a normal process in which your ability to get pregnant comes to an end. This condition causes hot flashes, night sweats, decreased interest in sex, mood swings, headaches, or lack of sleep. Treatment for this condition may include hormone replacement therapy. Take actions to keep yourself healthy, including exercising regularly, eating a healthy diet, watching your weight, and checking your blood pressure and blood sugar levels. Get screened for cancer and depression. Make sure that you are up to date with all your vaccines. This information is not intended to replace advice given to you by your health care provider. Make sure you discuss any questions you have with your health care provider. Document Revised: 11/12/2020 Document Reviewed: 11/12/2020 Elsevier Patient Education  2024 [employer].

## 2023-09-24 NOTE — Assessment & Plan Note (Signed)
 Blood pressure improved as patient rested in exam room.  Continue amlodipine 10 mg

## 2023-09-24 NOTE — Progress Notes (Signed)
 Assessment & Plan:  Routine general medical examination at a health care facility Assessment & Plan: Deferred pelvic exam in the absence of complaints and Pap smear is up-to-date.  She plans to follow with Dr. Valentino Saxon.  Politely declines clinical breast exam as she has recently seen general surgery.  Encouraged exercise  Orders: -     VITAMIN D 25 Hydroxy (Vit-D Deficiency, Fractures) -     Hemoglobin A1c -     CBC with Differential/Platelet -     Comprehensive metabolic panel -     Lipid panel -     TSH -     Insulin, random  Family history of heart disease Assessment & Plan: Encouraged consideration for further restratification including CT calcium score d/t family history.  She will consider this test and let me know.    Obesity, unspecified class, unspecified obesity type, unspecified whether serious comorbidity present Assessment & Plan: Insurance did not cover Zepbound.  Discussed Wellbutrin however she has upcoming celebratory trip in May 2025.  We discussed alcohol and lowering of seizure threshold on Wellbutrin.  We opted to consider Wellbutrin eventually after her trip.  Discussed metformin, she politely declines at this time.  Discussed grading for deficit and provided low glycemic diet   Hypertension, unspecified type Assessment & Plan: Blood pressure improved as patient rested in exam room.  Continue amlodipine 10 mg      Return precautions given.   Risks, benefits, and alternatives of the medications and treatment plan prescribed today were discussed, and patient expressed understanding.   Education regarding symptom management and diagnosis given to patient on AVS either electronically or printed.  Return in about 6 months (around 03/26/2024).  Rennie Plowman, FNP  Subjective:    Patient ID: Lorraine Holmes, female    DOB: [birth year], 53 y.o.   MRN: [id number]  CC: Lorraine Holmes is a 53 y.o. female who presents today for physical exam.    HPI: She is  frustrated by weight.  She would like to have knee surgery however she needs to lose weight first.  Insurance idid not cover Zepbound.  No history of seizure, anorexia or bulimia She does have an alcoholic beverage on occasion.  She has a trip planned for rehab in May for her 79s as part    Colorectal Cancer Screening: UTD , 07/18/19, repeat in 7 years Breast Cancer Screening: Diagnostic Mammogram UTD, 09/10/23, ordered by Dr Smith Robert; h/o right breast cancer. She continues to follow with general surgery, Dr. Claudine Mouton Cervical Cancer Screening: UTD, obtained by Dr. Valentino Saxon 02/27/2022; negative HPV, negative malignancy Bone Health screening/DEXA for 65+: No increased fracture risk. Defer screening at this time.       Tetanus - UTD      Exercise: Gets regular exercise, limited by knee pain. She is doing stationary bike 3 days per week.  Alcohol use:  occassional Smoking/tobacco use: Nonsmoker.    Health Maintenance  Topic Date Due   COVID-19 Vaccine (3 - Pfizer risk series) 11/04/2019   Flu Shot  10/05/2023*   Mammogram  09/09/2025   Colon Cancer Screening  07/17/2026   Pap with HPV screening  02/22/2027   DTaP/Tdap/Td vaccine (2 - Td or Tdap) 05/14/2027   Hepatitis C Screening  Completed   HIV Screening  Completed   Zoster (Shingles) Vaccine  Completed   HPV Vaccine  Aged Out   Pneumococcal Vaccination  Discontinued  *Topic was postponed. The date shown is not the original due date.  ALLERGIES: No healthtouch food allergies  Current Outpatient Medications on File Prior to Visit  Medication Sig Dispense Refill   amLODipine (NORVASC) 10 MG tablet TAKE 1 TABLET BY MOUTH EVERY DAY 90 tablet 3   cetirizine (ZYRTEC) 10 MG tablet Take 10 mg by mouth daily.     cholecalciferol (VITAMIN D3) 25 MCG (1000 UNIT) tablet Take 1,000 Units by mouth daily.     letrozole (FEMARA) 2.5 MG tablet TAKE 1 TABLET BY MOUTH EVERY DAY 90 tablet 1   letrozole (FEMARA) 2.5 MG tablet Take 1 tablet (2.5 mg total)  by mouth daily. 90 tablet 1   No current facility-administered medications on file prior to visit.    Review of Systems  Constitutional:  Negative for chills and fever.  Respiratory:  Negative for cough.   Cardiovascular:  Negative for chest pain and palpitations.  Gastrointestinal:  Negative for nausea and vomiting.  Musculoskeletal:  Positive for arthralgias (knee pain).      Objective:    BP 120/80   Pulse 78   Temp 97.8 F (36.6 C) (Oral)   Ht 5\' 6"  (1.676 m)   Wt 284 lb 4.8 oz (129 kg)   LMP  (LMP Unknown)   SpO2 98%   BMI 45.89 kg/m   BP Readings from Last 3 Encounters:  09/24/23 120/80  09/17/23 (!) 155/94  05/15/23 137/79   Wt Readings from Last 3 Encounters:  09/24/23 284 lb 4.8 oz (129 kg)  09/17/23 280 lb 12.8 oz (127.4 kg)  05/15/23 278 lb 6.4 oz (126.3 kg)    Physical Exam Vitals reviewed.  Constitutional:      Appearance: She is well-developed.  Eyes:     Conjunctiva/sclera: Conjunctivae normal.  Neck:     Thyroid: No thyroid mass or thyromegaly.  Cardiovascular:     Rate and Rhythm: Normal rate and regular rhythm.     Pulses: Normal pulses.     Heart sounds: Normal heart sounds.  Pulmonary:     Effort: Pulmonary effort is normal.     Breath sounds: Normal breath sounds. No wheezing, rhonchi or rales.  Lymphadenopathy:     Head:     Right side of head: No submental, submandibular, tonsillar, preauricular, posterior auricular or occipital adenopathy.     Left side of head: No submental, submandibular, tonsillar, preauricular, posterior auricular or occipital adenopathy.     Cervical: No cervical adenopathy.  Skin:    General: Skin is warm and dry.  Neurological:     Mental Status: She is alert.  Psychiatric:        Speech: Speech normal.        Behavior: Behavior normal.        Thought Content: Thought content normal.

## 2023-09-25 LAB — INSULIN, RANDOM: Insulin: 13 u[IU]/mL

## 2023-09-27 ENCOUNTER — Encounter: Payer: Self-pay | Admitting: Family

## 2023-11-13 ENCOUNTER — Inpatient Hospital Stay: Payer: BC Managed Care – PPO | Admitting: Oncology

## 2023-12-11 ENCOUNTER — Encounter: Payer: Self-pay | Admitting: Oncology

## 2023-12-11 ENCOUNTER — Inpatient Hospital Stay: Attending: Oncology | Admitting: Oncology

## 2023-12-11 VITALS — BP 132/67 | HR 85 | Temp 97.6°F | Resp 18 | Ht 66.0 in | Wt 285.4 lb

## 2023-12-11 DIAGNOSIS — M85851 Other specified disorders of bone density and structure, right thigh: Secondary | ICD-10-CM | POA: Diagnosis not present

## 2023-12-11 DIAGNOSIS — Z803 Family history of malignant neoplasm of breast: Secondary | ICD-10-CM | POA: Diagnosis not present

## 2023-12-11 DIAGNOSIS — D0511 Intraductal carcinoma in situ of right breast: Secondary | ICD-10-CM

## 2023-12-11 DIAGNOSIS — Z79811 Long term (current) use of aromatase inhibitors: Secondary | ICD-10-CM

## 2023-12-11 DIAGNOSIS — Z79899 Other long term (current) drug therapy: Secondary | ICD-10-CM | POA: Diagnosis not present

## 2023-12-11 DIAGNOSIS — Z923 Personal history of irradiation: Secondary | ICD-10-CM | POA: Diagnosis not present

## 2023-12-11 DIAGNOSIS — Z86000 Personal history of in-situ neoplasm of breast: Secondary | ICD-10-CM

## 2023-12-13 NOTE — Progress Notes (Signed)
 Hematology/Oncology Consult note [facility]  [phone] [phone]  Patient Care Team: Calista Catching, FNP as PCP - General (Family Medicine) Avonne Boettcher, MD as Consulting Physician (Oncology) Flynn Hylan, MD as Consulting Physician (General Surgery) Glenis Langdon, MD as Consulting Physician (Radiation Oncology) Teresa Fender, MD (Inactive) as Consulting Physician (Obstetrics and Gynecology)   Name of the patient: Lorraine Holmes  [id number]  26-May-1971   Date of visit: 12/13/23  Diagnosis- right breast DCIS ER positive  Chief complaint/ Reason for visit- routine f/u of right breast DCIS  Heme/Onc history: Patient is a 53 -year-old female who was found to have calcifications in the right breast on a screening mammogram that was followed by diagnostic mammogram and ultrasound.  Showed 0.8 cm of indeterminate calcification which was biopsied and consistent with high-grade DCIS comedo type.  She underwent Lumpectomy which showed high-grade DCIS which was negative for any invasive carcinoma.  Margins negative with 2 mm ER positive pTis.   Patient has met with radiation oncology and will be going for adjuvant radiation treatment as well.  She has a Mirena  in place for the last 7 years and has not had any menstrual cycles on Mirena .  Mirena  taken out in June 2022 and no menstrual cycle since then.  Patient started taking tamoxifen  in July 2022.  She was switched to letrozole  in April 2024    Interval history- patient has ongoing bilateral knee pain. Tolerating letrozole  well without any significant side effects. No menstrual cycles since 2022 after Mirena  was removed  ECOG PS- 1 Pain scale- 0   Review of systems- Review of Systems  Constitutional:  Negative for chills, fever, malaise/fatigue and weight loss.  HENT:  Negative for congestion, ear discharge and nosebleeds.   Eyes:  Negative for blurred vision.  Respiratory:  Negative  for cough, hemoptysis, sputum production, shortness of breath and wheezing.   Cardiovascular:  Negative for chest pain, palpitations, orthopnea and claudication.  Gastrointestinal:  Negative for abdominal pain, blood in stool, constipation, diarrhea, heartburn, melena, nausea and vomiting.  Genitourinary:  Negative for dysuria, flank pain, frequency, hematuria and urgency.  Musculoskeletal:  Negative for back pain, joint pain and myalgias.  Skin:  Negative for rash.  Neurological:  Negative for dizziness, tingling, focal weakness, seizures, weakness and headaches.  Endo/Heme/Allergies:  Does not bruise/bleed easily.  Psychiatric/Behavioral:  Negative for depression and suicidal ideas. The patient does not have insomnia.       Allergies  Allergen Reactions   No Healthtouch Food Allergies Hives    Many food allergies.  Takes zyrtec for hives everyday     Past Medical History:  Diagnosis Date   Anemia    Arthritis    Bilateral knee   Breast cancer (HCC)    Ductal carcinoma in situ (DCIS) of right breast   Breast cancer (HCC)    Ductal carcinoma in situ (DCIS) of right breast 10/05/2020   History of blood transfusion    History of chicken pox    Hypertension    Obesity    bmi 44   Personal history of radiation therapy      Past Surgical History:  Procedure Laterality Date   BREAST BIOPSY Right 09/27/2020   stereo bx, ribbon clip, positive   BREAST LUMPECTOMY Right 10/24/2020   lumpectomy High grade DCIS   BREAST LUMPECTOMY WITH RADIOFREQUENCY TAG IDENTIFICATION Right 10/24/2020   Procedure: BREAST LUMPECTOMY WITH RADIOFREQUENCY TAG IDENTIFICATION;  Surgeon: Flynn Hylan, MD;  Location: [facility] ORS;  Service: General;  Laterality: Right;   COLONOSCOPY WITH PROPOFOL  N/A 07/18/2019   Procedure: COLONOSCOPY WITH PROPOFOL ;  Surgeon: Selena Daily, MD;  Location: [facility];  Service: Gastroenterology;  Laterality: N/A;   FOOT FRACTURE SURGERY Left    2002.  metal in  toe   JOINT REPLACEMENT     KNEE ARTHROSCOPY WITH MEDIAL MENISECTOMY Left 11/28/2014   Procedure: KNEE ARTHROSCOPY WITH MEDIAL MENISECTOMY;  Surgeon: Molli Angelucci, MD;  Location: [facility] ORS;  Service: Orthopedics;  Laterality: Left;   MEDIAL PARTIAL KNEE REPLACEMENT Right 2013   meniscus tear Right 2012    Social History   Socioeconomic History   Marital status: Single    Spouse name: Not on file   Number of children: 2   Years of education: 16   Highest education level: Not on file  Occupational History   Occupation: Agricultural engineer: [employer]    Comment: [employer]  Tobacco Use   Smoking status: Never    Passive exposure: Past   Smokeless tobacco: Never  Vaping Use   Vaping status: Never Used  Substance and Sexual Activity   Alcohol use: Yes    Alcohol/week: 2.0 standard drinks of alcohol    Types: 2 Standard drinks or equivalent per week    Comment: occ   Drug use: No   Sexual activity: Yes    Comment: mirena   Other Topics Concern   Not on file  Social History Narrative   Lorraine Holmes grew up in [place]. She lives at home with her children and her mother. 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       Working [employer] in [place].       Works from home      Social Drivers of Corporate investment banker Strain: Not on [employer] Insecurity: Not on file  Transportation Needs: Not on file  Physical Activity: Not on file  Stress: Not on file  Social Connections: Not on file  Intimate Partner Violence: Not on file    Family History  Problem Relation Age of Onset   Hypertension Mother    Diabetes Mother    Hypertension Father    Heart disease Father 39       CAD - died MI   Diabetes Maternal Grandmother    Breast cancer Maternal Aunt        great aunt   Colon cancer Neg Hx    Thyroid  cancer Neg Hx      Current Outpatient Medications:    amLODipine  (NORVASC ) 10 MG tablet,  TAKE 1 TABLET BY MOUTH EVERY DAY, Disp: 90 tablet, Rfl: 3   cetirizine (ZYRTEC) 10 MG tablet, Take 10 mg by mouth daily., Disp: , Rfl:    cholecalciferol (VITAMIN D3) 25 MCG (1000 UNIT) tablet, Take 1,000 Units by mouth daily., Disp: , Rfl:    letrozole  (FEMARA ) 2.5 MG tablet, TAKE 1 TABLET BY MOUTH EVERY DAY, Disp: 90 tablet, Rfl: 1   letrozole  (FEMARA ) 2.5 MG tablet, Take 1 tablet (2.5 mg total) by mouth daily., Disp: 90 tablet, Rfl: 1  Physical exam:  Vitals:   12/11/23 1353  BP: 132/67  Pulse: 85  Resp: 18  Temp: 97.6 F (36.4 C)  TempSrc: Tympanic  SpO2: 100%  Weight: 285 lb 6.4 oz (129.5 kg)  Height: 5\' 6"  (1.676 m)   Physical Exam Cardiovascular:     Rate and  Rhythm: Normal rate and regular rhythm.     Heart sounds: Normal heart sounds.  Pulmonary:     Effort: Pulmonary effort is normal.     Breath sounds: Normal breath sounds.  Skin:    General: Skin is warm and dry.  Neurological:     Mental Status: She is alert and oriented to person, place, and time.    Breast exam was performed in seated and lying down position. Patient is status post right lumpectomy with a well-healed surgical scar. No evidence of any palpable masses. No evidence of axillary adenopathy. No evidence of any palpable masses or lumps in the left breast. No evidence of leftt axillary adenopathy   I have personally reviewed labs listed below:    Latest Ref Rng & Units 09/24/2023    9:49 AM  CMP  Glucose 70 - 99 mg/dL 161   BUN 6 - 23 mg/dL 13   Creatinine 0.96 - 1.20 mg/dL 0.45   Sodium 409 - 811 mEq/L 144   Potassium 3.5 - 5.1 mEq/L 4.7   Chloride 96 - 112 mEq/L 107   CO2 19 - 32 mEq/L 28   Calcium 8.4 - 10.5 mg/dL 91.4   Total Protein 6.0 - 8.3 g/dL 7.3   Total Bilirubin 0.2 - 1.2 mg/dL 0.5   Alkaline Phos 39 - 117 U/L 91   AST 0 - 37 U/L 21   ALT 0 - 35 U/L 20       Latest Ref Rng & Units 09/24/2023    9:49 AM  CBC  WBC 4.0 - 10.5 K/uL 7.4   Hemoglobin 12.0 - 15.0 g/dL 78.2    Hematocrit 95.6 - 46.0 % 39.1   Platelets 150.0 - 400.0 K/uL 298.0     Assessment and plan- Patient is a 53 y.o. female seen in follow-up of right breast DCIS presently on letrozole   Clinically patient is doing well with no concerning signs and symptoms of recurrence based on today's exam.  She is tolerating letrozole  well without any significant side effects and will continue taking it until2027.  She has not had any menstrual cycles since taking her Mirena  out in 2022 and therefore is postmenopausal.  Also discussed the results of bone density scan from March 2025 which showed osteopenia in her right femur with a T-score of -1.1.  However her 10-year probability of a major osteoporotic fracture remains less than 20% and hip fracture less than 3%.  She will therefore only continue with calcium and vitamin D  and will not require any bisphosphonates at this time.  Her mammogram from March 2025 was also unremarkable.  I will see her back in 6 months no labs   Visit Diagnosis 1. High risk medication use   2. Encounter for follow-up surveillance of ductal carcinoma in situ (DCIS) of breast   3. Use of letrozole  (Femara )   4. Osteopenia of neck of right femur      Dr. Seretha Dance, MD, MPH [facility] [phone] 12/13/2023 12:17 PM

## 2024-03-02 ENCOUNTER — Other Ambulatory Visit: Payer: Self-pay | Admitting: Oncology

## 2024-06-17 ENCOUNTER — Inpatient Hospital Stay: Admitting: Oncology

## 2024-07-13 ENCOUNTER — Other Ambulatory Visit: Payer: Self-pay | Admitting: Oncology

## 2024-07-13 ENCOUNTER — Encounter: Payer: Self-pay | Admitting: Oncology

## 2024-07-13 ENCOUNTER — Inpatient Hospital Stay: Attending: Oncology | Admitting: Oncology

## 2024-07-13 VITALS — BP 132/79 | HR 93 | Temp 98.6°F | Resp 20 | Wt 285.6 lb

## 2024-07-13 DIAGNOSIS — Z86000 Personal history of in-situ neoplasm of breast: Secondary | ICD-10-CM | POA: Diagnosis not present

## 2024-07-13 DIAGNOSIS — D0511 Intraductal carcinoma in situ of right breast: Secondary | ICD-10-CM | POA: Diagnosis present

## 2024-07-13 DIAGNOSIS — Z08 Encounter for follow-up examination after completed treatment for malignant neoplasm: Secondary | ICD-10-CM

## 2024-07-13 DIAGNOSIS — Z79899 Other long term (current) drug therapy: Secondary | ICD-10-CM

## 2024-07-13 DIAGNOSIS — Z923 Personal history of irradiation: Secondary | ICD-10-CM | POA: Insufficient documentation

## 2024-07-13 DIAGNOSIS — Z803 Family history of malignant neoplasm of breast: Secondary | ICD-10-CM | POA: Diagnosis not present

## 2024-07-13 DIAGNOSIS — Z79811 Long term (current) use of aromatase inhibitors: Secondary | ICD-10-CM | POA: Insufficient documentation

## 2024-07-13 NOTE — Progress Notes (Signed)
 "    Hematology/Oncology Consult note [facility]  [phone] [phone]  Patient Care Team: Dineen Rollene MATSU, FNP as PCP - General (Family Medicine) Melanee Annah BROCKS, MD as Consulting Physician (Oncology) Lenn Aran, MD as Consulting Physician (Radiation Oncology) Connell Davies, MD (Inactive) as Consulting Physician (Obstetrics and Gynecology)   Name of the patient: Lorraine Holmes  [id number]  1970-09-21   Date of visit: 07/13/2024  Diagnosis-right breast DCIS ER positive  Chief complaint/ Reason for visit-routine follow-up of DCIS presently on letrozole   Heme/Onc history: Patient is a 54 -year-old female who was found to have calcifications in the right breast on a screening mammogram that was followed by diagnostic mammogram and ultrasound.  Showed 0.8 cm of indeterminate calcification which was biopsied and consistent with high-grade DCIS comedo type.  She underwent Lumpectomy which showed high-grade DCIS which was negative for any invasive carcinoma.  Margins negative with 2 mm ER positive pTis.   Patient has met with radiation oncology and will be going for adjuvant radiation treatment as well.  She has a Mirena  in place for the last 7 years and has not had any menstrual cycles on Mirena .  Mirena  taken out in June 2022 and no menstrual cycle since then.  Patient started taking tamoxifen  in July 2022.  She was switched to letrozole  in April 2024    Interval history-she is doing well presently and denies any breast concerns at this time.  Tolerating letrozole  well without any significant side effects  ECOG PS- 0 Pain scale- 0   Review of systems- Review of Systems  Constitutional:  Negative for chills, fever, malaise/fatigue and weight loss.  HENT:  Negative for congestion, ear discharge and nosebleeds.   Eyes:  Negative for blurred vision.  Respiratory:  Negative for cough, hemoptysis, sputum production, shortness of breath and  wheezing.   Cardiovascular:  Negative for chest pain, palpitations, orthopnea and claudication.  Gastrointestinal:  Negative for abdominal pain, blood in stool, constipation, diarrhea, heartburn, melena, nausea and vomiting.  Genitourinary:  Negative for dysuria, flank pain, frequency, hematuria and urgency.  Musculoskeletal:  Negative for back pain, joint pain and myalgias.  Skin:  Negative for rash.  Neurological:  Negative for dizziness, tingling, focal weakness, seizures, weakness and headaches.  Endo/Heme/Allergies:  Does not bruise/bleed easily.  Psychiatric/Behavioral:  Negative for depression and suicidal ideas. The patient does not have insomnia.       Allergies[1]   Past Medical History:  Diagnosis Date   Anemia    Arthritis    Bilateral knee   Breast cancer (HCC)    Ductal carcinoma in situ (DCIS) of right breast   Breast cancer (HCC)    Ductal carcinoma in situ (DCIS) of right breast 10/05/2020   History of blood transfusion    History of chicken pox    Hypertension    Obesity    bmi 44   Personal history of radiation therapy      Past Surgical History:  Procedure Laterality Date   BREAST BIOPSY Right 09/27/2020   stereo bx, ribbon clip, positive   BREAST LUMPECTOMY Right 10/24/2020   lumpectomy High grade DCIS   BREAST LUMPECTOMY WITH RADIOFREQUENCY TAG IDENTIFICATION Right 10/24/2020   Procedure: BREAST LUMPECTOMY WITH RADIOFREQUENCY TAG IDENTIFICATION;  Surgeon: Lane Shope, MD;  Location: [facility] OR[facility];  Service: General;  Laterality: Right;   COLONOSCOPY WITH PROPOFOL  N/A 07/18/2019   Procedure: COLONOSCOPY WITH PROPOFOL ;  Surgeon: Unk Corinn Skiff, MD;  Location: [facility] END[facility];  Service:  Gastroenterology;  Laterality: N/A;   FOOT FRACTURE SURGERY Left    2002.  metal in toe   JOINT REPLACEMENT     KNEE ARTHROSCOPY WITH MEDIAL MENISECTOMY Left 11/28/2014   Procedure: KNEE ARTHROSCOPY WITH MEDIAL MENISECTOMY;  Surgeon: Ozell Flake, MD;  Location:  [facility] ORS;  Service: Orthopedics;  Laterality: Left;   MEDIAL PARTIAL KNEE REPLACEMENT Right 2013   meniscus tear Right 2012    Social History   Socioeconomic History   Marital status: Single    Spouse name: Not on file   Number of children: 2   Years of education: 16   Highest education level: Not on file  Occupational History   Occupation: Agricultural Engineer: [employer]    Comment: [employer]  Tobacco Use   Smoking status: Never    Passive exposure: Past   Smokeless tobacco: Never  Vaping Use   Vaping status: Never Used  Substance and Sexual Activity   Alcohol use: Yes    Alcohol/week: 2.0 standard drinks of alcohol    Types: 2 Standard drinks or equivalent per week    Comment: occ   Drug use: No   Sexual activity: Yes    Comment: mirena   Other Topics Concern   Not on file  Social History Narrative   Lynae grew up in [place]. She lives at home with her children and her mother. 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       Working [employer] in [place].       Works from home      Social Drivers of Health   Tobacco Use: Low Risk (07/13/2024)   Patient History    Smoking Tobacco Use: Never    Smokeless Tobacco Use: Never    Passive Exposure: Past  Financial Resource Strain: Not on file  Food Insecurity: Not on file  Transportation Needs: Not on file  Physical Activity: Not on file  Stress: Not on file  Social Connections: Not on file  Intimate Partner Violence: Not on file  Depression (PHQ2-9): Low Risk (09/24/2023)   Depression (PHQ2-9)    PHQ-2 Score: 0  Alcohol Screen: Not on file  Housing: Not on file  Utilities: Not on file  Health Literacy: Not on file    Family History  Problem Relation Age of Onset   Hypertension Mother    Diabetes Mother    Hypertension Father    Heart disease Father 47       CAD - died MI   Diabetes Maternal Grandmother    Breast cancer  Maternal Aunt        great aunt   Colon cancer Neg Hx    Thyroid  cancer Neg Hx     Current Medications[2]  Physical exam:  Vitals:   07/13/24 1504  BP: 132/79  Pulse: 93  Resp: 20  Temp: 98.6 F (37 C)  SpO2: 100%  Weight: 285 lb 9.6 oz (129.5 kg)   Physical Exam Cardiovascular:     Rate and Rhythm: Normal rate and regular rhythm.     Heart sounds: Normal heart sounds.  Pulmonary:     Effort: Pulmonary effort is normal.     Breath sounds: Normal breath sounds.  Skin:    General: Skin is warm and dry.  Neurological:     Mental Status: She is alert and oriented to person, place, and time.    Breast exam was performed in seated and  lying down position. Patient is status post right lumpectomy with a well-healed surgical scar. No evidence of any palpable masses. No evidence of axillary adenopathy. No evidence of any palpable masses or lumps in the left breast. No evidence of leftt axillary adenopathy   I have personally reviewed labs listed below:    Latest Ref Rng & Units 09/24/2023    9:49 AM  CMP  Glucose 70 - 99 mg/dL 895   BUN 6 - 23 mg/dL 13   Creatinine 9.59 - 1.20 mg/dL 9.25   Sodium 864 - 854 mEq/L 144   Potassium 3.5 - 5.1 mEq/L 4.7   Chloride 96 - 112 mEq/L 107   CO2 19 - 32 mEq/L 28   Calcium 8.4 - 10.5 mg/dL 89.6   Total Protein 6.0 - 8.3 g/dL 7.3   Total Bilirubin 0.2 - 1.2 mg/dL 0.5   Alkaline Phos 39 - 117 U/L 91   AST 0 - 37 U/L 21   ALT 0 - 35 U/L 20       Latest Ref Rng & Units 09/24/2023    9:49 AM  CBC  WBC 4.0 - 10.5 K/uL 7.4   Hemoglobin 12.0 - 15.0 g/dL 87.2   Hematocrit 63.9 - 46.0 % 39.1   Platelets 150.0 - 400.0 K/uL 298.0      Assessment and plan- Patient is a 54 y.o. female with history of right breast DCIS ER positive presently on letrozole  here for a routine follow-up  Clinically patient is doing well with no concerning signs and symptoms of recurrence based on today's exam.  She started taking endocrine therapy in July 2022  and will take it for 5 years ending in July 2017.  She is tolerating letrozole  well without any significant side effects.  I will see her back in 6 months no labs.  She will get a mammogram in March 2026   Visit Diagnosis 1. Use of letrozole  (Femara )   2. Encounter for follow-up surveillance of ductal carcinoma in situ (DCIS) of breast   3. High risk medication use      Dr. Annah Skene, MD, MPH [facility] [phone] 07/13/2024 3:57 PM                   [1]  Allergies Allergen Reactions   No Healthtouch Food Allergies Hives    Many food allergies.  Takes zyrtec for hives everyday  [2]  Current Outpatient Medications:    amLODipine  (NORVASC ) 10 MG tablet, TAKE 1 TABLET BY MOUTH EVERY DAY, Disp: 90 tablet, Rfl: 3   cetirizine (ZYRTEC) 10 MG tablet, Take 10 mg by mouth daily., Disp: , Rfl:    letrozole  (FEMARA ) 2.5 MG tablet, TAKE 1 TABLET BY MOUTH EVERY DAY, Disp: 90 tablet, Rfl: 1   letrozole  (FEMARA ) 2.5 MG tablet, TAKE 1 TABLET BY MOUTH EVERY DAY, Disp: 90 tablet, Rfl: 1   cholecalciferol (VITAMIN D3) 25 MCG (1000 UNIT) tablet, Take 1,000 Units by mouth daily. (Patient not taking: Reported on 07/13/2024), Disp: , Rfl:   "

## 2024-09-15 ENCOUNTER — Encounter

## 2024-09-29 ENCOUNTER — Encounter: Admitting: Family

## 2025-01-13 ENCOUNTER — Inpatient Hospital Stay: Admitting: Oncology

## 2025-01-13 ENCOUNTER — Inpatient Hospital Stay
